# Patient Record
Sex: Female | Born: 1950 | Race: Black or African American | Hispanic: No | Marital: Married | Smoking: Former smoker
Health system: Southern US, Community
[De-identification: ages and names within clinical notes are randomized; demographics above are authoritative.]

## PROBLEM LIST (undated history)

## (undated) DIAGNOSIS — F419 Anxiety disorder, unspecified: Secondary | ICD-10-CM

## (undated) DIAGNOSIS — F39 Unspecified mood [affective] disorder: Secondary | ICD-10-CM

## (undated) DIAGNOSIS — G4730 Sleep apnea, unspecified: Secondary | ICD-10-CM

## (undated) DIAGNOSIS — R569 Unspecified convulsions: Secondary | ICD-10-CM

## (undated) DIAGNOSIS — E119 Type 2 diabetes mellitus without complications: Secondary | ICD-10-CM

## (undated) DIAGNOSIS — F0390 Unspecified dementia without behavioral disturbance: Secondary | ICD-10-CM

## (undated) DIAGNOSIS — I00 Rheumatic fever without heart involvement: Secondary | ICD-10-CM

## (undated) DIAGNOSIS — I639 Cerebral infarction, unspecified: Secondary | ICD-10-CM

## (undated) DIAGNOSIS — E785 Hyperlipidemia, unspecified: Secondary | ICD-10-CM

## (undated) DIAGNOSIS — I10 Essential (primary) hypertension: Secondary | ICD-10-CM

## (undated) HISTORY — DX: Rheumatic fever without heart involvement: I00

## (undated) HISTORY — DX: Unspecified mood (affective) disorder: F39

## (undated) HISTORY — DX: Anxiety disorder, unspecified: F41.9

## (undated) HISTORY — DX: Sleep apnea, unspecified: G47.30

---

## 2016-08-09 ENCOUNTER — Ambulatory Visit: Payer: Medicare HMO | Attending: Family Medicine | Admitting: Occupational Therapy

## 2016-08-09 DIAGNOSIS — R278 Other lack of coordination: Secondary | ICD-10-CM | POA: Diagnosis present

## 2016-08-09 DIAGNOSIS — I69315 Cognitive social or emotional deficit following cerebral infarction: Secondary | ICD-10-CM

## 2016-08-09 DIAGNOSIS — R41842 Visuospatial deficit: Secondary | ICD-10-CM | POA: Insufficient documentation

## 2016-08-11 NOTE — Therapy (Signed)
[facility] [address] [place], [place], [address] Phone: [phone]   Fax:  [phone]  Occupational Therapy Treatment  Patient Details  Name: Angela Fields MRN: [id number] Date of Birth: 1951/08/17 Referring Provider: Dr. Caffie DammeKarla Smith  Encounter Date: 08/09/2016      OT End of Session - 08/11/16 1558    Visit Number 1   Number of Visits 5   Date for OT Re-Evaluation 09/08/16   Authorization Type Medicare   Authorization - Visit Number 1   Authorization - Number of Visits 10   OT Start Time 1400   OT Stop Time 1445   OT Time Calculation (min) 45 min   Activity Tolerance Patient tolerated treatment well   Behavior During Therapy Impulsive      No past medical history on file.  No past surgical history on file.  There were no vitals filed for this visit.      Subjective Assessment - 08/11/16 1547    Subjective  Pt s/p CVA in 2010 presents with residual coordination/ visual deficits   Pertinent History see Epic   Patient Stated Goals improve functional use of Right hand   Currently in Pain? No/denies            [facility] - 08/11/16 0001      Assessment   Diagnosis CVA   Referring Provider Dr. Caffie DammeKarla Smith   Onset Date --  2010     Precautions   Precautions Other (comment)  aphasia   Precaution Comments aphasia, right visual field deficit     Balance Screen   Has the patient fallen in the past 6 months Yes   How many times? 1   Has the patient had a decrease in activity level because of a fear of falling?  No   Is the patient reluctant to leave their home because of a fear of falling?  No     Home  Environment   Family/patient expects to be discharged to: Private residence   [employer] Shower/Tub Tub/Shower unit   Lives With Spouse     Prior Function   Level of Independence Independent     ADL   ADL comments modified independent with all basic ADLs     IADL   Shopping  Needs to be accompanied on any shopping trip   Light Housekeeping --  Performs home management with some assistance   Meal Prep --  distant supervision, difficulty organizing   Medication Management --  Needs assist with meds     Mobility   Mobility Status Independent     Written Expression   Dominant Hand Right   Handwriting 100% legible     Vision - History   Baseline Vision Wears glasses only for reading   Patient Visual Report Peripheral vision impairment     Vision Assessment   Vision Assessment Vision impaired  _ to be further tested in functional context   Visual Fields Right visual field deficit per gross assessment   Comment Pt's husband reports pt has a right peripheral visual field deficit     Cognition   Overall Cognitive Status Cognition to be further assessed in functional context PRN  aphasia     Sensation   Light Touch Appears Intact     Coordination   9 Hole Peg Test Right;Left   Right 9 Hole Peg Test 43.09 secs   Left 9 Hole Peg Test 29.34 secs   Tremors tremors at times in  both hands, right greater than left and head     ROM / Strength   AROM / PROM / Strength AROM;Strength     AROM   Overall AROM  Within functional limits for tasks performed     Strength   Overall Strength Comments RUE 4-4+/5, LUE 4-4+/5                               OT Long Term Goals - 08/11/16 1607      OT LONG TERM GOAL #1   Title I with HEP.   Time 4   Period Weeks   Status New     OT LONG TERM GOAL #2   Title Pt/ husband will verbalize understanding of compensatory strategies for visual perceptual deficits   Time 4   Period Weeks   Status New     OT LONG TERM GOAL #3   Title Pt./ husband will verbalize understanding of adapted strategies to increase pt safety and independence with home mnagement tasks(ie:laundry)   Time 4   Period Weeks   Status New               Plan - 08/11/16 1554    Clinical Impression Statement Pt s/p  CVA in 2010 presents with residual coordination, cognitive and visuospatial deficits. Pt can benefit from skilled occupational therapy to maximize safety and independence with ADLs/ IADLs.   Rehab Potential Good   Clinical Impairments Affecting Rehab Potential aphasia   OT Frequency 1x / week   OT Duration 4 weeks  plus eval   OT Treatment/Interventions Self-care/ADL training;Therapeutic exercise;Cognitive remediation/compensation;Visual/perceptual remediation/compensation;Patient/family education;Therapeutic exercises;DME and/or AE instruction;Manual Therapy;Passive range of motion;Therapeutic activities;Neuromuscular education;Moist Heat   Plan coordination HEP, education regarding vision strategies, laundry task   Consulted and Agree with Plan of Care Patient;Family member/caregiver   Family Member Consulted husband      Patient will benefit from skilled therapeutic intervention in order to improve the following deficits and impairments:  Decreased cognition, Impaired vision/preception, Decreased coordination, Decreased strength, Impaired UE functional use, Impaired perceived functional ability, Decreased safety awareness, Decreased knowledge of precautions  Visit Diagnosis: Other lack of coordination - Plan: Ot plan of care cert/re-cert  Visuospatial deficit - Plan: Ot plan of care cert/re-cert  Cognitive social or emotional deficit following cerebral infarction - Plan: Ot plan of care cert/re-cert    Problem List There are no active problems to display for this patient.   RINE,KATHRYN 08/11/2016, 4:15 PM Keene BreathKathryn Rine, OTR/L [phone] Phone: [phone] 4:15 PM 08/11/16 [facility] [address] [place], [place], [address] Phone: [phone]   Fax:  [phone]  Name: Angela Fields MRN: [id number] Date of Birth: Jun 04, 1951

## 2016-08-15 ENCOUNTER — Telehealth: Payer: Self-pay | Admitting: Occupational Therapy

## 2016-08-15 NOTE — Telephone Encounter (Signed)
Dr. Caffie DammeKarla Fields , Philomena DohenyCarolyn Zaro was seen for occupational therapy evaluation. Eber JonesCarolyn and her husband would like an order for ST to address pt's aphasia and cognition. If you agree please make this referral. Thanks, Keene BreathKathryn Rain [place], OTR/L

## 2016-08-20 ENCOUNTER — Ambulatory Visit: Payer: Medicare HMO | Admitting: Occupational Therapy

## 2016-08-20 ENCOUNTER — Ambulatory Visit: Payer: Medicare HMO | Attending: Family Medicine | Admitting: Occupational Therapy

## 2016-08-20 DIAGNOSIS — R278 Other lack of coordination: Secondary | ICD-10-CM | POA: Diagnosis present

## 2016-08-20 DIAGNOSIS — I69315 Cognitive social or emotional deficit following cerebral infarction: Secondary | ICD-10-CM

## 2016-08-20 DIAGNOSIS — R41842 Visuospatial deficit: Secondary | ICD-10-CM

## 2016-08-20 NOTE — Therapy (Signed)
[facility] [address] [place], [place], [address] Phone: [phone]   Fax:  [phone]  Occupational Therapy Treatment  Patient Details  Name: Angela DohenyCarolyn Fields MRN: [id number] Date of Birth: 04-18-1951 Referring Provider: Dr. Caffie DammeKarla Smith  Encounter Date: 08/20/2016      OT End of Session - 08/20/16 1516    Visit Number 2   Number of Visits 5   Date for OT Re-Evaluation 09/08/16   Authorization Type Medicare   Authorization - Visit Number 2   Authorization - Number of Visits 10   OT Start Time 1452   OT Stop Time 1530   OT Time Calculation (min) 38 min   Activity Tolerance Patient tolerated treatment well   Behavior During Therapy [facility] for tasks assessed/performed      No past medical history on file.  No past surgical history on file.  There were no vitals filed for this visit.      Subjective Assessment - 08/20/16 1453    Subjective  denies pain   Pertinent History see Epic   Patient Stated Goals improve functional use of Right hand   Currently in Pain? No/denies           Copying small peg design with RUE for increased fine motor coordination and visual perceptual skills, mod / max difficulty for design.                    OT Education - 08/20/16 1613    Education provided Yes   Education Details coordination HEP, compensation strategies for visual perceptual deficits   Person(s) Educated Patient;Spouse   Methods Explanation;Demonstration;Verbal cues;Handout   Comprehension Verbalized understanding;Returned demonstration;Verbal cues required             OT Long Term Goals - 08/11/16 1607      OT LONG TERM GOAL #1   Title I with HEP.   Time 4   Period Weeks   Status New     OT LONG TERM GOAL #2   Title Pt/ husband will verbalize understanding of compensatory strategies for visual perceptual deficits   Time 4   Period Weeks   Status New     OT LONG TERM GOAL #3    Title Pt./ husband will verbalize understanding of adapted strategies to increase pt safety and independence with home mnagement tasks(ie:laundry)   Time 4   Period Weeks   Status New               Plan - 08/20/16 1611    Clinical Impression Statement Pt is progressing towards goals. Pt/ husband verbalize understanding of HEP.   Rehab Potential Good   Clinical Impairments Affecting Rehab Potential aphasia   OT Frequency 1x / week   OT Duration 4 weeks   OT Treatment/Interventions Self-care/ADL training;Therapeutic exercise;Cognitive remediation/compensation;Visual/perceptual remediation/compensation;Patient/family education;Therapeutic exercises;DME and/or AE instruction;Manual Therapy;Passive range of motion;Therapeutic activities;Neuromuscular education;Moist Heat   Plan laundry task   Consulted and Agree with Plan of Care Patient   Family Member Consulted husband      Patient will benefit from skilled therapeutic intervention in order to improve the following deficits and impairments:  Decreased cognition, Impaired vision/preception, Decreased coordination, Decreased strength, Impaired UE functional use, Impaired perceived functional ability, Decreased safety awareness, Decreased knowledge of precautions  Visit Diagnosis: Other lack of coordination  Visuospatial deficit  Cognitive social or emotional deficit following cerebral infarction    Problem List There are no active problems to display for this  patient.   RINE,KATHRYN 08/20/2016, 4:21 PM Keene BreathKathryn Rine, OTR/L [phone] Phone: [phone] 4:22 PM 11/07/17[facility] [address] [place], [place], [address] Phone: [phone]   Fax:  [phone]  Name: Angela DohenyCarolyn Fields MRN: [id number] Date of Birth: [birth year]

## 2016-08-20 NOTE — Patient Instructions (Addendum)
  Coordination Activities  Perform the following activities for 20 minutes 1 times per day with right hand(s).   Rotate ball in fingertips (clockwise and counter-clockwise).  Toss ball between hands.  Flip cards 1 at a time as fast as you can.  Deal cards with your thumb (Hold deck in hand and push card off top with thumb).  Pick up coins and place in container or coin bank.  Pick up coins and stack.   Hold 5 coins in your hand and count out 1 at a time                                  1. Look for the edge of objects (to the left and/or right) so that you make sure you are seeing all of an object 2. Turn your head when walking, scan from side to side, particularly in busy environments 3. Use an organized scanning pattern. It's usually easier to scan from top to bottom, and left to right (like you are reading) 4. Double check yourself 5. Use a line guide (like a blank piece of paper) or your finger when reading 6. If necessary, place brightly colored tape at end of table or work area as a reminder to always look until you see the tape.

## 2016-08-27 ENCOUNTER — Ambulatory Visit: Payer: Medicare HMO | Admitting: Occupational Therapy

## 2016-08-27 DIAGNOSIS — R278 Other lack of coordination: Secondary | ICD-10-CM | POA: Diagnosis not present

## 2016-08-27 DIAGNOSIS — R41842 Visuospatial deficit: Secondary | ICD-10-CM

## 2016-08-27 DIAGNOSIS — I69315 Cognitive social or emotional deficit following cerebral infarction: Secondary | ICD-10-CM

## 2016-08-28 NOTE — Therapy (Signed)
[facility] [address] [place], [place], [address] Phone: [phone]   Fax:  [phone]  Occupational Therapy Treatment  Patient Details  Name: Angela Fields MRN: [id number] Date of Birth: 05/25/1951 Referring Provider: Dr. Caffie DammeKarla Smith  Encounter Date: 08/27/2016      OT End of Session - 08/28/16 1041    Visit Number 3   Number of Visits 5   Date for OT Re-Evaluation 09/08/16   Authorization Type Medicare   Authorization - Visit Number 3   Authorization - Number of Visits 10   OT Start Time 1321   OT Stop Time 1400   OT Time Calculation (min) 39 min   Activity Tolerance Patient tolerated treatment well      No past medical history on file.  No past surgical history on file.  There were no vitals filed for this visit.                Treatment: laundry task for washing and drying lcohtes. Pt and therapist simulated task step by step with husband present, pt required mod v.c. To sequence task. Pt's husband to review with pt at home then pt will attempt task with supervision. Disussed rationale for completing a 24 piece puzzle, for cognition and visual perceptual skills. Pt worked on a 24 piece puzzle with mod-max v.c. For organization/ scanning, pt and husband to atttempt at home.                 OT Long Term Goals - 08/11/16 1607      OT LONG TERM GOAL #1   Title I with HEP.   Time 4   Period Weeks   Status New     OT LONG TERM GOAL #2   Title Pt/ husband will verbalize understanding of compensatory strategies for visual perceptual deficits   Time 4   Period Weeks   Status New     OT LONG TERM GOAL #3   Title Pt./ husband will verbalize understanding of adapted strategies to increase pt safety and independence with home mnagement tasks(ie:laundry)   Time 4   Period Weeks   Status New               Plan - 08/28/16 1038    Clinical Impression Statement Pt is  progressing towards goals. She demonstrates cognitive and visual perceptual deficits.   Rehab Potential Good   Clinical Impairments Affecting Rehab Potential aphasia   OT Frequency 1x / week   OT Duration 4 weeks   OT Treatment/Interventions Self-care/ADL training;Therapeutic exercise;Cognitive remediation/compensation;Visual/perceptual remediation/compensation;Patient/family education;Therapeutic exercises;DME and/or AE instruction;Manual Therapy;Passive range of motion;Therapeutic activities;Neuromuscular education;Moist Heat   Plan review progress HEP, discuss adpated strategies for home management- anticipate d/c next visit   Consulted and Agree with Plan of Care Patient   Family Member Consulted husband      Patient will benefit from skilled therapeutic intervention in order to improve the following deficits and impairments:  Decreased cognition, Impaired vision/preception, Decreased coordination, Decreased strength, Impaired UE functional use, Impaired perceived functional ability, Decreased safety awareness, Decreased knowledge of precautions  Visit Diagnosis: Other lack of coordination  Visuospatial deficit  Cognitive social or emotional deficit following cerebral infarction    Problem List There are no active problems to display for this patient.   RINE,KATHRYN 08/28/2016, 10:41 AM Keene BreathKathryn Rine, OTR/L [phone] Phone: [phone] 10:44 AM 08/28/16 [facility] [address] [place], [place], [address] Phone: [phone]  Fax:  [phone]  Name: Angela Fields MRN: [id number] Date of Birth: 1951-09-04

## 2016-09-19 ENCOUNTER — Ambulatory Visit: Payer: Medicare HMO | Admitting: Occupational Therapy

## 2016-09-24 ENCOUNTER — Emergency Department (HOSPITAL_COMMUNITY)
Admission: EM | Admit: 2016-09-24 | Discharge: 2016-09-24 | Disposition: A | Discharge status: Home / Self Care | Payer: Medicare HMO | Attending: Emergency Medicine | Admitting: Emergency Medicine

## 2016-09-24 ENCOUNTER — Encounter (HOSPITAL_COMMUNITY): Payer: Self-pay

## 2016-09-24 ENCOUNTER — Emergency Department (HOSPITAL_COMMUNITY): Payer: Medicare HMO

## 2016-09-24 DIAGNOSIS — R4701 Aphasia: Secondary | ICD-10-CM | POA: Diagnosis not present

## 2016-09-24 DIAGNOSIS — E119 Type 2 diabetes mellitus without complications: Secondary | ICD-10-CM | POA: Diagnosis not present

## 2016-09-24 DIAGNOSIS — I69398 Other sequelae of cerebral infarction: Secondary | ICD-10-CM | POA: Diagnosis not present

## 2016-09-24 DIAGNOSIS — Z5181 Encounter for therapeutic drug level monitoring: Secondary | ICD-10-CM | POA: Diagnosis not present

## 2016-09-24 DIAGNOSIS — Z8673 Personal history of transient ischemic attack (TIA), and cerebral infarction without residual deficits: Secondary | ICD-10-CM | POA: Insufficient documentation

## 2016-09-24 DIAGNOSIS — H53469 Homonymous bilateral field defects, unspecified side: Secondary | ICD-10-CM | POA: Diagnosis not present

## 2016-09-24 DIAGNOSIS — I10 Essential (primary) hypertension: Secondary | ICD-10-CM | POA: Insufficient documentation

## 2016-09-24 DIAGNOSIS — E785 Hyperlipidemia, unspecified: Secondary | ICD-10-CM | POA: Diagnosis present

## 2016-09-24 DIAGNOSIS — R4182 Altered mental status, unspecified: Secondary | ICD-10-CM | POA: Diagnosis present

## 2016-09-24 HISTORY — DX: Hyperlipidemia, unspecified: E78.5

## 2016-09-24 HISTORY — DX: Cerebral infarction, unspecified: I63.9

## 2016-09-24 HISTORY — DX: Type 2 diabetes mellitus without complications: E11.9

## 2016-09-24 HISTORY — DX: Essential (primary) hypertension: I10

## 2016-09-24 LAB — COMPREHENSIVE METABOLIC PANEL
ALT: 15 U/L (ref 14–54)
AST: 19 U/L (ref 15–41)
Albumin: 3.9 g/dL (ref 3.5–5.0)
Alkaline Phosphatase: 55 U/L (ref 38–126)
Anion gap: 10 (ref 5–15)
BUN: 11 mg/dL (ref 6–20)
CO2: 29 mmol/L (ref 22–32)
Calcium: 9.2 mg/dL (ref 8.9–10.3)
Chloride: 100 mmol/L — ABNORMAL LOW (ref 101–111)
Creatinine, Ser: 1.31 mg/dL — ABNORMAL HIGH (ref 0.44–1.00)
GFR calc Af Amer: 48 mL/min — ABNORMAL LOW (ref >60)
GFR calc non Af Amer: 41 mL/min — ABNORMAL LOW (ref >60)
Glucose, Bld: 114 mg/dL — ABNORMAL HIGH (ref 65–99)
Potassium: 3.1 mmol/L — ABNORMAL LOW (ref 3.5–5.1)
Sodium: 139 mmol/L (ref 135–145)
Total Bilirubin: 0.5 mg/dL (ref 0.3–1.2)
Total Protein: 6.8 g/dL (ref 6.5–8.1)

## 2016-09-24 LAB — DIFFERENTIAL
Basophils Absolute: 0 10*3/uL (ref 0.0–0.1)
Basophils Relative: 0 %
Eosinophils Absolute: 0.1 10*3/uL (ref 0.0–0.7)
Eosinophils Relative: 1 %
Lymphocytes Relative: 29 %
Lymphs Abs: 1.6 10*3/uL (ref 0.7–4.0)
Monocytes Absolute: 0.4 10*3/uL (ref 0.1–1.0)
Monocytes Relative: 6 %
Neutro Abs: 3.5 10*3/uL (ref 1.7–7.7)
Neutrophils Relative %: 64 %

## 2016-09-24 LAB — URINALYSIS, ROUTINE W REFLEX MICROSCOPIC
Bacteria, UA: NONE SEEN
Bilirubin Urine: NEGATIVE
Glucose, UA: NEGATIVE mg/dL
Ketones, ur: NEGATIVE mg/dL
Nitrite: NEGATIVE
Protein, ur: NEGATIVE mg/dL
Specific Gravity, Urine: 1.001 — ABNORMAL LOW (ref 1.005–1.030)
pH: 7 (ref 5.0–8.0)

## 2016-09-24 LAB — I-STAT CHEM 8, ED
BUN: 14 mg/dL (ref 6–20)
Calcium, Ion: 1.12 mmol/L — ABNORMAL LOW (ref 1.15–1.40)
Chloride: 98 mmol/L — ABNORMAL LOW (ref 101–111)
Creatinine, Ser: 1.3 mg/dL — ABNORMAL HIGH (ref 0.44–1.00)
Glucose, Bld: 112 mg/dL — ABNORMAL HIGH (ref 65–99)
HCT: 38 % (ref 36.0–46.0)
Hemoglobin: 12.9 g/dL (ref 12.0–15.0)
Potassium: 3 mmol/L — ABNORMAL LOW (ref 3.5–5.1)
Sodium: 140 mmol/L (ref 135–145)
TCO2: 30 mmol/L (ref 0–100)

## 2016-09-24 LAB — PROTIME-INR
INR: 1.01
Prothrombin Time: 13.3 seconds (ref 11.4–15.2)

## 2016-09-24 LAB — I-STAT TROPONIN, ED: Troponin i, poc: 0 ng/mL (ref 0.00–0.08)

## 2016-09-24 LAB — CBC
HCT: 38.1 % (ref 36.0–46.0)
Hemoglobin: 12.8 g/dL (ref 12.0–15.0)
MCH: 30.1 pg (ref 26.0–34.0)
MCHC: 33.6 g/dL (ref 30.0–36.0)
MCV: 89.6 fL (ref 78.0–100.0)
Platelets: 214 10*3/uL (ref 150–400)
RBC: 4.25 MIL/uL (ref 3.87–5.11)
RDW: 13.2 % (ref 11.5–15.5)
WBC: 5.5 10*3/uL (ref 4.0–10.5)

## 2016-09-24 LAB — RAPID URINE DRUG SCREEN, HOSP PERFORMED
Amphetamines: NOT DETECTED
Barbiturates: NOT DETECTED
Benzodiazepines: NOT DETECTED
Cocaine: NOT DETECTED
Opiates: NOT DETECTED
Tetrahydrocannabinol: NOT DETECTED

## 2016-09-24 LAB — APTT: aPTT: 27 seconds (ref 24–36)

## 2016-09-24 LAB — ETHANOL: Alcohol, Ethyl (B): <5 mg/dL (ref <5)

## 2016-09-24 MED ORDER — LAMOTRIGINE 150 MG PO TABS
300.0000 mg | ORAL_TABLET | Freq: Once | ORAL | Status: AC
  Administered 2016-09-24: 300 mg via ORAL
  Filled 2016-09-24: qty 2

## 2016-09-24 NOTE — Consult Note (Signed)
Requesting Physician: EDP    Chief Complaint: Increased confusion  History obtained from:   Patient and Chart     HPI:                                                                                                                                         Angela DohenyCarolyn Fields is an 65 y.o. 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 This morning, patient was in the bathroom at 0300 when she was calling for her husband repeatedly. Patient's husband states he checked on her 3 or 4 times instructing her to get off the toilet and go back to bed. She said she was still urinating. This went on for over an hour and a half. Finally, patient's husband called EMS as he felt she was more confused than her baseline. He notes that she took an extra klonopin in the middle of the night that was in addition to her normal scheduled doses. At her baseline, patient has receptive and expressive aphasia with right visual field deficits. Patient's husband reports that her aphasia comes and goes but seems to be getting worse over time with understanding. He states that three years ago they recommended nursing facility placement for her, but he refused. He feels he needs more help at this time. She had no seizure like activity, complaints of weakness, numbness, falls, vision changes or hearing changes.   Patient does not feel that anything is wrong with her.   In the ED, patient was afebrile, hypertension at 173/82, HR 64, RR 17, satting 100% on room air. BMET revealed hypokalemia at 3.1, Creatinine of 1.31, glucose 114. CBC WNL. Troponin negative. EKG sinus rhythm. Alcohol negative, UDS negative, UA without signs of infection. CT Head showed no acute ischemic infarct. Negative for intracranial Hemorrhage. Chronic  infarct left occipital parietal lobe. Chronic microvascular ischemia in the white matter. 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  Date last known well: Date 09/24/16 Time last known well: Time: 01:30 tPA Given: No: Out of window No Symptoms         0 No significant disability/able to carry out all usual activities   1 Unable to carry out all previous activities but looks after own affairs             2 Requires help but walks without assistance     3 Unable to walk without assistance/unable to handle own bodily needs 4 Bedridden/incontinent        5 Dead          6  Modified Rankin: Rankin Score=3   Past Medical History:  Diagnosis Date  . Diabetes mellitus without complication (HCC)   . Hyperlipidemia   .  Hypertension   . Stroke [facility](HCC)    Family Hx: Father- Heart Disease Mother: Cancer  Social History:  reports that she has never smoked. She has never used smokeless tobacco. She reports that she does not use drugs. Her alcohol history is not on file.  Allergies: No Known Allergies  Medications:                                                                                                                           I have reviewed the patient's current medications. Metoprolol, lamictal, klonopin 0.5 mg 1/2 tab TID, Vimpat, Kdur, Valsartan-HCTZ,   ROS:                                                                                                                                       A complete ROS was negative except as per HPI.   Neurologic Examination:                                                                                                      Vitals:   09/24/16 0915 09/24/16 0930 09/24/16 0945 09/24/16 1000  BP: 151/61 180/67 153/75 (!) 163/139  Pulse: 63 (!) 59 (!) 54 (!) 59  Resp: 13 (!) 9 (!) 7 19  Temp:      TempSrc:      SpO2: 97% 98% 98% 96%  Weight:      Height:       General: Vital signs reviewed.  Patient is well-developed and  well-nourished, in no acute distress and cooperative with exam.  Head: Normocephalic and atraumatic. Eyes: EOMI, conjunctivae normal, no scleral icterus.  Neck: Supple, trachea midline Cardiovascular: RRR Pulmonary/Chest: Clear to auscultation bilaterally, no wheezes, rales, or rhonchi. Abdominal: Soft, non-tender, non-distended, BS + Extremities: No lower extremity edema bilaterally, pulses symmetric and intact bilaterally.  Skin: Warm, dry and intact. No rashes or erythema.  Neurological Examination Mental Status: Alert, oriented to self and place but not  time.  Speech is broken with evidence of receptive and expressive aphasia also with confabulation. Unable to follow 3 step commands without difficulty.  Cranial Nerves: II: Right Homonymous Hemianopsia III,IV, VI: ptosis not present, extra-ocular motions intact bilaterally, pupils equal, round, reactive to light and accommodation V,VII: smile symmetric, facial light touch sensation normal bilaterally VIII: hearing normal bilaterally IX,X: uvula rises symmetrically XI: bilateral shoulder shrug XII: midline tongue extension Motor: Right : Upper extremity   5/5    Left:     Upper extremity   5/5  Lower extremity   5/5     Lower extremity   5/5 Tone and bulk:normal tone throughout; no atrophy noted Sensory: Decreased sensation in right upper extremity Plantars: Right: downgoing   Left: downgoing Cerebellar: Mild tremor on finger-to-nose. Unable to perform normal rapid alternating movements and normal heel-to-shin test    Lab Results: Basic Metabolic Panel:  Recent Labs Lab 09/24/16 0649 09/24/16 0709  NA 139 140  K 3.1* 3.0*  CL 100* 98*  CO2 29  --   GLUCOSE 114* 112*  BUN 11 14  CREATININE 1.31* 1.30*  CALCIUM 9.2  --     Liver Function Tests:  Recent Labs Lab 09/24/16 0649  AST 19  ALT 15  ALKPHOS 55  BILITOT 0.5  PROT 6.8  ALBUMIN 3.9   CBC:  Recent Labs Lab 09/24/16 0649 09/24/16 0709  WBC 5.5   --   NEUTROABS 3.5  --   HGB 12.8 12.9  HCT 38.1 38.0  MCV 89.6  --   PLT 214  --    Coagulation Studies:  Recent Labs  09/24/16 0649  LABPROT 13.3  INR 1.01    Imaging: Ct Head Code Stroke W/o Cm  Result Date: 09/24/2016 CLINICAL DATA:  Code stroke.  Stroke symptoms EXAM: CT HEAD WITHOUT CONTRAST TECHNIQUE: Contiguous axial images were obtained from the base of the skull through the vertex without intravenous contrast. COMPARISON:  None. FINDINGS: Brain: Chronic infarct left occipital parietal lobe. Chronic microvascular ischemic change throughout the cerebral white matter bilaterally. Mild atrophy. Negative for acute infarct. Negative for hemorrhage or mass. No shift of the midline structures. Vascular: No hyperdense vessel. 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 Skull: Negative Sinuses/Orbits: Negative Other: None ASPECTS (Alberta Stroke Program Early CT Score) - Ganglionic level infarction (caudate, lentiform nuclei, internal capsule, insula, M1-M3 cortex): 7 - Supraganglionic infarction (M4-M6 cortex): 3 Total score (0-10 with 10 being normal): 10 IMPRESSION: 1. Negative for acute ischemic infarct. Negative for intracranial hemorrhage. 2. ASPECTS is 10 3. Chronic infarct left occipital parietal lobe. Chronic microvascular ischemia in the white matter. 4. 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 These results were called by telephone at the time of interpretation on 09/24/2016 at 7:26 am to Dr. Amada Jupiter, who verbally acknowledged these results. Electronically Signed   By: Marlan Palau M.D.   On: 09/24/2016 07:27   Stroke Risk Factors - diabetes mellitus, hyperlipidemia and hypertension  Assessment: 65 y.o. 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  Acute Encephalopathy 2/2 Medication Effect: Patient has baseline receptive and expressive aphasia with right sided homonymous hemianopsia from her previous CVA in 2010. Per husband, her confusion was worse this morning after taking an extra dose of Klonopin. He reported no new deficits  or seizure like activity. CT Head without acute intracranial abnormality. Her acute worsening and transient confusion was likely secondary to Klonopin as this can worsen baseline deficits and makes the possibility of recurrent seizures less likely. Patient's husband feels he needs more help at home and has become frustrated with his situation. Case management may be helpful in this circumstance. No further imaging is necessary at this time. Patient can follow up as outpatient.   Assessment and plan discussed with with attending physician and they are in agreement.    Karlene Lineman, DO PGY-3 Internal Medicine Resident Pager # [phone] 09/24/2016 10:08 AM

## 2016-09-24 NOTE — ED Provider Notes (Signed)
[facility]-EMERGENCY DEPT Provider Note   CSN: [id number] Arrival date & time: 09/24/16  [id number]    History   Chief Complaint Chief Complaint  Patient presents with  . Altered Mental Status    HPI Angela Fields is a 65 y.o. female.  HPI   65 year old female presents today with altered mental status. Patient has a history of CVA in 2010. EMS notes that patient went to bed normal last night, husband noted she was altered at 3:30 this morning.  Per chart review patient has aphasia, patient reports she does have aphasia. She is unable to provide any other significant details, she is unable to follow commands, she appears to be no acute distress, has very clear speech, does not appear ill.  Spoke with her husband on the phone reports that patient was on the toilet last night. She was acting appropriately but was very demanding. He reports that she kept calling out for him every 15 minutes wanting something different. She would not get off the toilet, but was not still pain. Patient reports that she took one of her anti-anxiety medicine shortly before becoming more confused. Husband notes that this behavior is not atypical of her, states that he was frustrated and called EMS. He denies any focal neurological deficits beyond her baseline aphasia.     Past Medical History:  Diagnosis Date  . Diabetes mellitus without complication (HCC)   . Hyperlipidemia   . Hypertension   . Stroke [facility](HCC)     Patient Active Problem List   Diagnosis Date Noted  . Hypertension 09/24/2016  . History of CVA (cerebrovascular accident) 09/24/2016  . 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 Hyperlipidemia 09/24/2016    No past surgical history on file.  OB History    No data available       Home Medications    Prior to Admission medications   Not on File    Family History No family history on file.  Social History Social History  Substance Use Topics  . Smoking status:  Never Smoker  . Smokeless tobacco: Never Used  . Alcohol use Not on file     Allergies   Patient has no known allergies.   Review of Systems Review of Systems  All other systems reviewed and are negative.  Physical Exam Updated Vital Signs BP 133/64   Pulse 61   Temp 97.8 F (36.6 C)   Resp 12   Ht 5\' 5"  (1.651 m)   Wt 88.5 kg   SpO2 96%   BMI 32.45 kg/m   Physical Exam  Constitutional: She appears well-developed and well-nourished.  HENT:  Head: Normocephalic and atraumatic.  Eyes: Conjunctivae are normal. Pupils are equal, round, and reactive to light. Right eye exhibits no discharge. Left eye exhibits no discharge. No scleral icterus. Right pupil is round and reactive. Left pupil is round and reactive. Pupils are equal.  Neck: Normal range of motion. No JVD present. No tracheal deviation present.  Pulmonary/Chest: Effort normal and breath sounds normal. No stridor. No respiratory distress. She has no wheezes. She has no rales. She exhibits no tenderness.  Neurological: She is alert. No sensory deficit. Coordination normal.  Difficult exam as patient does not follow commands- no facial asymmetry, upper and lower extremity strength 5 out of 5, sensation grossly intact, no pronator drift, unable to assess visual fields or horizontal extraocular movements- receptive aphasia- no dysarthria  Skin: Skin is warm.  Psychiatric: She has a normal mood and  affect. Her behavior is normal. Judgment and thought content normal.  Nursing note and vitals reviewed.    ED Treatments / Results  Labs (all labs ordered are listed, but only abnormal results are displayed) Labs Reviewed  COMPREHENSIVE METABOLIC PANEL - Abnormal; Notable for the following:       Result Value   Potassium 3.1 (*)    Chloride 100 (*)    Glucose, Bld 114 (*)    Creatinine, Ser 1.31 (*)    GFR calc non Af Amer 41 (*)    GFR calc Af Amer 48 (*)    All other components within normal limits  URINALYSIS,  ROUTINE W REFLEX MICROSCOPIC - Abnormal; Notable for the following:    Color, Urine COLORLESS (*)    Specific Gravity, Urine 1.001 (*)    Hgb urine dipstick SMALL (*)    Leukocytes, UA TRACE (*)    Squamous Epithelial / LPF 0-5 (*)    All other components within normal limits  I-STAT CHEM 8, ED - Abnormal; Notable for the following:    Potassium 3.0 (*)    Chloride 98 (*)    Creatinine, Ser 1.30 (*)    Glucose, Bld 112 (*)    Calcium, Ion 1.12 (*)    All other components within normal limits  ETHANOL  PROTIME-INR  APTT  CBC  DIFFERENTIAL  RAPID URINE DRUG SCREEN, HOSP PERFORMED  I-STAT TROPOININ, ED    EKG  EKG Interpretation None       Radiology Ct Head Code Stroke W/o Cm  Result Date: 09/24/2016 CLINICAL DATA:  Code stroke.  Stroke symptoms EXAM: CT HEAD WITHOUT CONTRAST TECHNIQUE: Contiguous axial images were obtained from the base of the skull through the vertex without intravenous contrast. COMPARISON:  None. FINDINGS: Brain: Chronic infarct left occipital parietal lobe. Chronic microvascular ischemic change throughout the cerebral white matter bilaterally. Mild atrophy. Negative for acute infarct. Negative for hemorrhage or mass. No shift of the midline structures. Vascular: No hyperdense vessel. 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 Skull: Negative Sinuses/Orbits: Negative Other: None ASPECTS ([place]berta Stroke Program Early CT Score) - Ganglionic level infarction (caudate, lentiform nuclei, internal capsule, insula, M1-M3 cortex): 7 - Supraganglionic infarction (M4-M6 cortex): 3 Total score (0-10 with 10 being normal): 10 IMPRESSION: 1. Negative for acute ischemic infarct. Negative for intracranial hemorrhage. 2. ASPECTS is 10 3. Chronic infarct left occipital parietal lobe. Chronic microvascular ischemia in the white matter. 4. 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 These results were called by telephone at the time of interpretation on 09/24/2016  at 7:26 am to Dr. Amada Jupiter, who verbally acknowledged these results. Electronically Signed   By: Marlan Palau M.D.   On: 09/24/2016 07:27    Procedures Procedures (including critical care time)  Medications Ordered in ED Medications - No data to display   Initial Impression / Assessment and Plan / ED Course  I have reviewed the triage vital signs and the nursing notes.  Pertinent labs & imaging results that were available during my care of the patient were reviewed by me and considered in my medical decision making (see chart for details).  Clinical Course      Final Clinical Impressions(s) / ED Diagnoses   Final diagnoses:  Aphasia    Labs: Urinalysis, ethanol, i-STAT Chem-8,  PT INR, CMP  Imaging: CT head without  Consults: Neurology  Therapeutics:  Discharge Meds:   Assessment/Plan:   65 year old female presents today at the request of her husband  for altered mental status. After speaking with the husband on the phone it appears that she is not truly altered, and seems to be more difficult to handle at home. She had a CT head here with laboratory analysis. No significant findings were noted. Neurology was consult at 2 personally evaluated the patient. We agree that this is highly unlikely to be intracranial abnormality, stroke, seizure-like activity. I spoke with the patient's husband, he will be picking her up to take her home. We discussed following up with both primary care for placement if symptoms continue to progress and he is unable to provide adequate care for her at home, also discussed following up with neurology in the next several days for reevaluation. He is given strict return precautions, verbalized understanding and agreement to today's plan had no further questions or concerns.  New Prescriptions New Prescriptions   No medications on file     Eyvonne MechanicJeffrey Kyasia Steuck, PA-C 09/24/16 1057    Shon Batonourtney F Horton, MD 09/24/16 2321

## 2016-09-24 NOTE — ED Notes (Signed)
Spoke with spouse Baldo AshCarl.  States I will be there in the next hour to get her.

## 2016-09-24 NOTE — Discharge Instructions (Signed)
Please read attached information. If you experience any new or worsening signs or symptoms please return to the emergency room for evaluation. Please follow-up with your primary care provider or specialist as discussed.  °

## 2016-09-24 NOTE — ED Triage Notes (Signed)
PT got up to use bathroom at 0330 today when husband found her to be altered. Per husband pt was normal before going to bed. On arrival to ed pt presents with aphasia. Pt states to me "i have aphasia". Hx of cva 2010. Pt having difficulty following directions. PA at bedside assessing pt.

## 2017-06-14 ENCOUNTER — Emergency Department (HOSPITAL_COMMUNITY): Payer: Medicare HMO

## 2017-06-14 ENCOUNTER — Encounter (HOSPITAL_COMMUNITY): Payer: Self-pay

## 2017-06-14 ENCOUNTER — Emergency Department (HOSPITAL_COMMUNITY)
Admission: EM | Admit: 2017-06-14 | Discharge: 2017-06-14 | Disposition: A | Discharge status: Home / Self Care | Payer: Medicare HMO | Attending: Emergency Medicine | Admitting: Emergency Medicine

## 2017-06-14 DIAGNOSIS — R404 Transient alteration of awareness: Secondary | ICD-10-CM | POA: Diagnosis not present

## 2017-06-14 DIAGNOSIS — Z79899 Other long term (current) drug therapy: Secondary | ICD-10-CM | POA: Insufficient documentation

## 2017-06-14 DIAGNOSIS — I10 Essential (primary) hypertension: Secondary | ICD-10-CM | POA: Insufficient documentation

## 2017-06-14 DIAGNOSIS — R4182 Altered mental status, unspecified: Secondary | ICD-10-CM | POA: Diagnosis present

## 2017-06-14 LAB — COMPREHENSIVE METABOLIC PANEL
ALBUMIN: 4.1 g/dL (ref 3.5–5.0)
ALT: 15 U/L (ref 14–54)
ANION GAP: 8 (ref 5–15)
AST: 16 U/L (ref 15–41)
Alkaline Phosphatase: 61 U/L (ref 38–126)
BUN: 14 mg/dL (ref 6–20)
CO2: 32 mmol/L (ref 22–32)
Calcium: 9 mg/dL (ref 8.9–10.3)
Chloride: 102 mmol/L (ref 101–111)
Creatinine, Ser: 1.08 mg/dL — ABNORMAL HIGH (ref 0.44–1.00)
GFR calc Af Amer: 60 mL/min — ABNORMAL LOW (ref >60)
GFR calc non Af Amer: 52 mL/min — ABNORMAL LOW (ref >60)
GLUCOSE: 81 mg/dL (ref 65–99)
POTASSIUM: 3.1 mmol/L — AB (ref 3.5–5.1)
SODIUM: 142 mmol/L (ref 135–145)
Total Bilirubin: 0.4 mg/dL (ref 0.3–1.2)
Total Protein: 7.1 g/dL (ref 6.5–8.1)

## 2017-06-14 LAB — URINALYSIS, ROUTINE W REFLEX MICROSCOPIC
BACTERIA UA: NONE SEEN
BILIRUBIN URINE: NEGATIVE
Glucose, UA: NEGATIVE mg/dL
Ketones, ur: NEGATIVE mg/dL
Leukocytes, UA: NEGATIVE
Nitrite: NEGATIVE
PH: 5 (ref 5.0–8.0)
Protein, ur: NEGATIVE mg/dL
SPECIFIC GRAVITY, URINE: 1.009 (ref 1.005–1.030)
WBC, UA: NONE SEEN WBC/hpf (ref 0–5)

## 2017-06-14 LAB — CBC WITH DIFFERENTIAL/PLATELET
BASOS ABS: 0 10*3/uL (ref 0.0–0.1)
Basophils Relative: 0 %
Eosinophils Absolute: 0.1 10*3/uL (ref 0.0–0.7)
Eosinophils Relative: 2 %
HEMATOCRIT: 36.4 % (ref 36.0–46.0)
HEMOGLOBIN: 12 g/dL (ref 12.0–15.0)
LYMPHS PCT: 37 %
Lymphs Abs: 2.1 10*3/uL (ref 0.7–4.0)
MCH: 29.9 pg (ref 26.0–34.0)
MCHC: 33 g/dL (ref 30.0–36.0)
MCV: 90.5 fL (ref 78.0–100.0)
MONO ABS: 0.4 10*3/uL (ref 0.1–1.0)
Monocytes Relative: 7 %
NEUTROS ABS: 3.1 10*3/uL (ref 1.7–7.7)
Neutrophils Relative %: 54 %
Platelets: 194 10*3/uL (ref 150–400)
RBC: 4.02 MIL/uL (ref 3.87–5.11)
RDW: 12.9 % (ref 11.5–15.5)
WBC: 5.7 10*3/uL (ref 4.0–10.5)

## 2017-06-14 LAB — TROPONIN I

## 2017-06-14 LAB — RAPID URINE DRUG SCREEN, HOSP PERFORMED
AMPHETAMINES: NOT DETECTED
Barbiturates: NOT DETECTED
Benzodiazepines: NOT DETECTED
COCAINE: NOT DETECTED
OPIATES: NOT DETECTED
TETRAHYDROCANNABINOL: NOT DETECTED

## 2017-06-14 LAB — CBG MONITORING, ED
GLUCOSE-CAPILLARY: 85 mg/dL (ref 65–99)
Glucose-Capillary: 71 mg/dL (ref 65–99)

## 2017-06-14 LAB — ETHANOL

## 2017-06-14 NOTE — ED Notes (Signed)
Bed: [id number] Expected date: 06/14/17 Expected time: 5:44 PM Means of arrival: Ambulance Comments: Hypoglycemia   CBG 64

## 2017-06-14 NOTE — ED Notes (Signed)
Pt's family stated the pt reported her tongue feeling weird for a couple days, and he would like her tongue assessed.

## 2017-06-14 NOTE — ED Triage Notes (Signed)
Patient present with c/o hypoglycemia per ems blood sugar 64. pt was given D10 4 grams per ems. recheck glucose was 120. Pt have slurred speech from previous stoke. Pt seem little confused at this time.

## 2017-06-14 NOTE — ED Provider Notes (Signed)
[facility]-EMERGENCY DEPT Provider Note   CSN: [id number] Arrival date & time: 06/14/17  1748  LEVEL 5 CAVEAT - ALTERED MENTAL STATUS  History   Chief Complaint Chief Complaint  Patient presents with  . Altered Mental Status    HPI Angela Fields is a 66 y.o. female.  HPI  66 year old female presents with acute altered mental status. History taken from the patient as well as the husband. EMS was no longer around by the time I evaluated the patient. According to the husband, the patient has been slightly off this morning. She seemed a little confused when taking her pills. She has a history of a prior stroke that has left her with aphasia. She is currently on medicine for seizures and has not had seizure in quite some time. Husband went to the store with her and left her in the car with the car on an air conditioning on. When he came back and saw her she looked like she was sleepy like she might pass out. She was telling her husband that she needed to go to the hospital. She couldn't answer exactly why. Has been started trying to drive to the hospital but couldn't find a hospital. He stopped at a festival where there was a Emergency planning/management officer who called 911. EMS arrived and her blood sugar was 64 that gave her D10 and it came up 220. Patient has no known diabetes and is not on medicine. She does have a history of prediabetes. The husband is now seeing her again after EMS took her and states that she is back to her baseline. The husband states it seemed like she was very still and might of been asleep but he does not think she passed out when he was driving. She had no complaints such as headache or chest pain. Patient tells me she got "weak" while in the car. However she has trouble telling me exactly what this means and what else was going on.  Past Medical History:  Diagnosis Date  . Diabetes mellitus without complication (HCC)   . Hyperlipidemia   . Hypertension   . Stroke [facility])     Patient Active  Problem List   Diagnosis Date Noted  . Hypertension 09/24/2016  . History of CVA (cerebrovascular accident) 09/24/2016  . 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 Hyperlipidemia 09/24/2016    No past surgical history on file.  OB History    No data available       Home Medications    Prior to Admission medications   Medication Sig Start Date End Date Taking? Authorizing Provider  clonazePAM (KLONOPIN) 1 MG tablet Take 0.5 mg by mouth 3 (three) times daily.   Yes [provider]  folic acid (FOLVITE) 1 MG tablet Take 1 mg by mouth daily.   Yes [provider]  Lacosamide (VIMPAT) 150 MG TABS Take 150 mg by mouth 2 (two) times daily.   Yes [provider]  LamoTRIgine (LAMICTAL XR) 50 MG TB24 Take 50 mg by mouth 2 (two) times daily.   Yes [provider]  LamoTRIgine 300 MG TB24 Take 300 mg by mouth daily.   Yes [provider]  metoprolol tartrate (LOPRESSOR) 25 MG tablet Take 25 mg by mouth 2 (two) times daily.   Yes [provider]  potassium chloride (K-DUR) 10 MEQ tablet Take 10 mEq by mouth daily.   Yes [provider]  pravastatin (PRAVACHOL) 10 MG tablet Take 10 mg by mouth  daily.   Yes [provider]  valsartan-hydrochlorothiazide (DIOVAN-HCT) 320-25 MG tablet Take 1 tablet by mouth daily.   Yes [provider]  Vitamin D, Ergocalciferol, (DRISDOL) 50000 units CAPS capsule Take 50,000 Units by mouth every 7 (seven) days. On Sundays   Yes [provider]    Family History No family history on file.  Social History Social History  Substance Use Topics  . Smoking status: Never Smoker  . Smokeless tobacco: Never Used  . Alcohol use No     Allergies   Patient has no known allergies.   Review of Systems Review of Systems  Unable to perform ROS: Mental status change     Physical Exam Updated Vital Signs BP 132/81   Pulse (!) 55   Temp 98 F  (36.7 C) (Oral)   Resp 12   SpO2 96%   Physical Exam  Constitutional: She appears well-developed and well-nourished.  HENT:  Head: Normocephalic and atraumatic.  Right Ear: External ear normal.  Left Ear: External ear normal.  Nose: Nose normal.  Eyes: Right eye exhibits no discharge. Left eye exhibits no discharge.  Cardiovascular: Normal rate, regular rhythm and normal heart sounds.   Pulmonary/Chest: Effort normal and breath sounds normal.  Abdominal: Soft. There is no tenderness.  Neurological: She is alert. She is disoriented.  She thinks it's yesterday which corresponds to the end of the month. This persists even after she has been corrected and asked later. CN 3-12 grossly intact. 5/5 strength in all 4 extremities. Grossly normal sensation. When asked to do finger to nose, she is unable to complete this, keeps her finger on her nose and seems confused.   Skin: Skin is warm and dry.  Nursing note and vitals reviewed.    ED Treatments / Results  Labs (all labs ordered are listed, but only abnormal results are displayed) Labs Reviewed  COMPREHENSIVE METABOLIC PANEL - Abnormal; Notable for the following:       Result Value   Potassium 3.1 (*)    Creatinine, Ser 1.08 (*)    GFR calc non Af Amer 52 (*)    GFR calc Af Amer 60 (*)    All other components within normal limits  URINALYSIS, ROUTINE W REFLEX MICROSCOPIC - Abnormal; Notable for the following:    Color, Urine STRAW (*)    Hgb urine dipstick SMALL (*)    Squamous Epithelial / LPF 0-5 (*)    All other components within normal limits  CBC WITH DIFFERENTIAL/PLATELET  ETHANOL  RAPID URINE DRUG SCREEN, HOSP PERFORMED  TROPONIN I  TROPONIN I  PROTIME-INR  APTT  CBG MONITORING, ED  CBG MONITORING, ED    EKG  EKG Interpretation  Date/Time:  Saturday June 14 2017 18:40:53 EDT Ventricular Rate:  56 PR Interval:    QRS Duration: 99 QT Interval:  420 QTC Calculation: 406 R Axis:   -15 Text  Interpretation:  Sinus rhythm Prolonged PR interval Borderline left axis deviation nonspecific T wave flattening new since Dec 2017 Confirmed by Pricilla LovelessGoldston, Jaid Quirion [phone]([id number]) on 06/14/2017 7:05:58 PM       Radiology Ct Head Wo Contrast  Result Date: 06/14/2017 CLINICAL DATA:  Altered mental status EXAM: CT HEAD WITHOUT CONTRAST TECHNIQUE: Contiguous axial images were obtained from the base of the skull through the vertex without intravenous contrast. COMPARISON:  09/24/2016, FINDINGS: Brain: No acute territorial infarction, hemorrhage or mass is seen. Large old area of encephalomalacia within the left parietal and occipital lobes with ex vacuo  dilatation of the left ventricle. Moderate small vessel ischemic changes of the white matter. No midline shift. Vascular: Metallic artifact near the left cavernous carotid artery. No hyperdense vessels Skull: No fracture Sinuses/Orbits: No acute finding. Other: None IMPRESSION: 1. No CT evidence for acute intracranial abnormality. 2. Similar appearance of old left-sided infarct. 3. Moderate to marked small vessel ischemic changes of the white matter. Electronically Signed   By: Jasmine Pang M.D.   On: 06/14/2017 20:26    Procedures Procedures (including critical care time)  Medications Ordered in ED Medications - No data to display   Initial Impression / Assessment and Plan / ED Course  I have reviewed the triage vital signs and the nursing notes.  Pertinent labs & imaging results that were available during my care of the patient were reviewed by me and considered in my medical decision making (see chart for details).     No clear cause for transient AMS. She did not pass out. Given prolonged episode, less likely near-syncope or cardiac event. Back to baseline in the ED. When further discussing with husband, he states this is similar to how she's been after a seizure. It's possible she had a seizure while he was away and was post ictal. I doubt it was  hypoglycemia, glucose was over 60. Plan to d/c with PCP and neuro f/u. No change in meds at this time. Discussed return precautions. No seizure like activity in the ED  Final Clinical Impressions(s) / ED Diagnoses   Final diagnoses:  Transient alteration of awareness    New Prescriptions Discharge Medication List as of 06/14/2017 10:49 PM       Pricilla Loveless, MD 06/15/17 [phone]

## 2017-06-14 NOTE — Discharge Instructions (Signed)
Return to the ER or call 911 if this occurs again. This may have been a seizure since she is now back to normal. Continue taking your seizure medicines as prescribed.

## 2017-06-14 NOTE — ED Notes (Signed)
Pt seemed confused when the writer was helping her provide urine specimen. She needed coaching to wash her hands. After urinating she said, "I need to get these clean." The writer instructed her how to wash her hands.

## 2017-06-19 ENCOUNTER — Encounter (HOSPITAL_COMMUNITY): Payer: Self-pay | Admitting: Emergency Medicine

## 2017-06-19 ENCOUNTER — Observation Stay (HOSPITAL_COMMUNITY)
Admission: EM | Admit: 2017-06-19 | Discharge: 2017-06-22 | Disposition: A | Discharge status: Home / Self Care | Payer: Medicare HMO | Attending: Internal Medicine | Admitting: Internal Medicine

## 2017-06-19 DIAGNOSIS — G9389 Other specified disorders of brain: Secondary | ICD-10-CM | POA: Insufficient documentation

## 2017-06-19 DIAGNOSIS — I10 Essential (primary) hypertension: Secondary | ICD-10-CM | POA: Diagnosis not present

## 2017-06-19 DIAGNOSIS — R001 Bradycardia, unspecified: Secondary | ICD-10-CM | POA: Insufficient documentation

## 2017-06-19 DIAGNOSIS — R569 Unspecified convulsions: Secondary | ICD-10-CM | POA: Diagnosis present

## 2017-06-19 DIAGNOSIS — G40901 Epilepsy, unspecified, not intractable, with status epilepticus: Principal | ICD-10-CM | POA: Insufficient documentation

## 2017-06-19 DIAGNOSIS — N289 Disorder of kidney and ureter, unspecified: Secondary | ICD-10-CM | POA: Insufficient documentation

## 2017-06-19 DIAGNOSIS — E785 Hyperlipidemia, unspecified: Secondary | ICD-10-CM | POA: Insufficient documentation

## 2017-06-19 DIAGNOSIS — H53469 Homonymous bilateral field defects, unspecified side: Secondary | ICD-10-CM | POA: Insufficient documentation

## 2017-06-19 DIAGNOSIS — Z79899 Other long term (current) drug therapy: Secondary | ICD-10-CM | POA: Insufficient documentation

## 2017-06-19 DIAGNOSIS — I69320 Aphasia following cerebral infarction: Secondary | ICD-10-CM | POA: Diagnosis not present

## 2017-06-19 DIAGNOSIS — E876 Hypokalemia: Secondary | ICD-10-CM | POA: Diagnosis not present

## 2017-06-19 DIAGNOSIS — E119 Type 2 diabetes mellitus without complications: Secondary | ICD-10-CM | POA: Insufficient documentation

## 2017-06-19 DIAGNOSIS — I69351 Hemiplegia and hemiparesis following cerebral infarction affecting right dominant side: Secondary | ICD-10-CM | POA: Diagnosis not present

## 2017-06-19 HISTORY — DX: Unspecified convulsions: R56.9

## 2017-06-19 LAB — URINALYSIS, ROUTINE W REFLEX MICROSCOPIC
BILIRUBIN URINE: NEGATIVE
Bacteria, UA: NONE SEEN
Glucose, UA: NEGATIVE mg/dL
Ketones, ur: NEGATIVE mg/dL
Leukocytes, UA: NEGATIVE
NITRITE: NEGATIVE
PROTEIN: NEGATIVE mg/dL
Specific Gravity, Urine: 1.01 (ref 1.005–1.030)
pH: 6 (ref 5.0–8.0)

## 2017-06-19 LAB — BASIC METABOLIC PANEL
ANION GAP: 7 (ref 5–15)
BUN: 16 mg/dL (ref 6–20)
CHLORIDE: 102 mmol/L (ref 101–111)
CO2: 32 mmol/L (ref 22–32)
CREATININE: 1.23 mg/dL — AB (ref 0.44–1.00)
Calcium: 9 mg/dL (ref 8.9–10.3)
GFR calc non Af Amer: 44 mL/min — ABNORMAL LOW (ref >60)
GFR, EST AFRICAN AMERICAN: 51 mL/min — AB (ref >60)
Glucose, Bld: 97 mg/dL (ref 65–99)
POTASSIUM: 3.1 mmol/L — AB (ref 3.5–5.1)
SODIUM: 141 mmol/L (ref 135–145)

## 2017-06-19 LAB — CBC WITH DIFFERENTIAL/PLATELET
Basophils Absolute: 0 10*3/uL (ref 0.0–0.1)
Basophils Relative: 1 %
EOS ABS: 0.1 10*3/uL (ref 0.0–0.7)
Eosinophils Relative: 2 %
HCT: 34.8 % — ABNORMAL LOW (ref 36.0–46.0)
HEMOGLOBIN: 11.4 g/dL — AB (ref 12.0–15.0)
LYMPHS PCT: 30 %
Lymphs Abs: 2 10*3/uL (ref 0.7–4.0)
MCH: 29.8 pg (ref 26.0–34.0)
MCHC: 32.8 g/dL (ref 30.0–36.0)
MCV: 90.9 fL (ref 78.0–100.0)
Monocytes Absolute: 0.4 10*3/uL (ref 0.1–1.0)
Monocytes Relative: 6 %
NEUTROS ABS: 4.1 10*3/uL (ref 1.7–7.7)
NEUTROS PCT: 63 %
Platelets: 205 10*3/uL (ref 150–400)
RBC: 3.83 MIL/uL — AB (ref 3.87–5.11)
RDW: 13.3 % (ref 11.5–15.5)
WBC: 6.6 10*3/uL (ref 4.0–10.5)

## 2017-06-19 LAB — RAPID URINE DRUG SCREEN, HOSP PERFORMED
Amphetamines: NOT DETECTED
BARBITURATES: NOT DETECTED
Benzodiazepines: POSITIVE — AB
Cocaine: NOT DETECTED
Opiates: NOT DETECTED
TETRAHYDROCANNABINOL: NOT DETECTED

## 2017-06-19 LAB — ETHANOL: Alcohol, Ethyl (B): <5 mg/dL (ref <5)

## 2017-06-19 LAB — PHOSPHORUS: Phosphorus: 3 mg/dL (ref 2.5–4.6)

## 2017-06-19 LAB — MAGNESIUM: MAGNESIUM: 2.4 mg/dL (ref 1.7–2.4)

## 2017-06-19 LAB — CBG MONITORING, ED: Glucose-Capillary: 84 mg/dL (ref 65–99)

## 2017-06-19 MED ORDER — VITAMIN D (ERGOCALCIFEROL) 1.25 MG (50000 UNIT) PO CAPS
50000.0000 [IU] | ORAL_CAPSULE | ORAL | Status: DC
  Administered 2017-06-22: 50000 [IU] via ORAL
  Filled 2017-06-19: qty 1

## 2017-06-19 MED ORDER — LAMOTRIGINE 100 MG PO TABS
200.0000 mg | ORAL_TABLET | Freq: Two times a day (BID) | ORAL | Status: DC
  Administered 2017-06-19 – 2017-06-22 (×8): 200 mg via ORAL
  Filled 2017-06-19 (×8): qty 2

## 2017-06-19 MED ORDER — LAMOTRIGINE 100 MG PO TABS: 200.0000 mg | ORAL_TABLET | Freq: Two times a day (BID) | ORAL | Status: DC

## 2017-06-19 MED ORDER — FOLIC ACID 1 MG PO TABS
1.0000 mg | ORAL_TABLET | Freq: Every day | ORAL | Status: DC
  Administered 2017-06-19 – 2017-06-22 (×4): 1 mg via ORAL
  Filled 2017-06-19 (×3): qty 1

## 2017-06-19 MED ORDER — IRBESARTAN 150 MG PO TABS
150.0000 mg | ORAL_TABLET | Freq: Every day | ORAL | Status: DC
  Administered 2017-06-19 – 2017-06-22 (×4): 150 mg via ORAL
  Filled 2017-06-19 (×4): qty 1

## 2017-06-19 MED ORDER — POTASSIUM CHLORIDE CRYS ER 20 MEQ PO TBCR
20.0000 meq | EXTENDED_RELEASE_TABLET | Freq: Every day | ORAL | Status: DC
  Administered 2017-06-19 – 2017-06-22 (×4): 20 meq via ORAL
  Filled 2017-06-19 (×3): qty 1

## 2017-06-19 MED ORDER — POTASSIUM CHLORIDE CRYS ER 20 MEQ PO TBCR
EXTENDED_RELEASE_TABLET | ORAL | Status: AC
  Filled 2017-06-19: qty 1

## 2017-06-19 MED ORDER — LORAZEPAM 2 MG/ML IJ SOLN: 2.0000 mg | INTRAMUSCULAR | Status: DC | PRN

## 2017-06-19 MED ORDER — CLONAZEPAM 0.5 MG PO TABS
0.5000 mg | ORAL_TABLET | Freq: Three times a day (TID) | ORAL | Status: DC
  Administered 2017-06-19 – 2017-06-22 (×11): 0.5 mg via ORAL
  Filled 2017-06-19 (×11): qty 1

## 2017-06-19 MED ORDER — ENOXAPARIN SODIUM 40 MG/0.4ML ~~LOC~~ SOLN
40.0000 mg | SUBCUTANEOUS | Status: DC
Start: 2017-06-19 — End: 2017-06-22
  Administered 2017-06-19 – 2017-06-20 (×2): 40 mg via SUBCUTANEOUS
  Filled 2017-06-19 (×3): qty 0.4

## 2017-06-19 MED ORDER — ACETAMINOPHEN 650 MG RE SUPP: 650.0000 mg | Freq: Four times a day (QID) | RECTAL | Status: DC | PRN

## 2017-06-19 MED ORDER — SODIUM CHLORIDE 0.9 % IV SOLN
200.0000 mg | Freq: Two times a day (BID) | INTRAVENOUS | Status: AC
  Administered 2017-06-19 – 2017-06-20 (×3): 200 mg via INTRAVENOUS
  Filled 2017-06-19 (×4): qty 20

## 2017-06-19 MED ORDER — METOPROLOL TARTRATE 25 MG PO TABS: 25.0000 mg | ORAL_TABLET | Freq: Two times a day (BID) | ORAL | Status: DC

## 2017-06-19 MED ORDER — POTASSIUM CHLORIDE IN NACL 20-0.9 MEQ/L-% IV SOLN
INTRAVENOUS | Status: DC
  Administered 2017-06-19: 11:00:00 via INTRAVENOUS
  Filled 2017-06-19 (×2): qty 1000

## 2017-06-19 MED ORDER — PRAVASTATIN SODIUM 20 MG PO TABS
10.0000 mg | ORAL_TABLET | Freq: Every day | ORAL | Status: DC
  Administered 2017-06-19 – 2017-06-22 (×4): 10 mg via ORAL
  Filled 2017-06-19 (×4): qty 1

## 2017-06-19 MED ORDER — FOLIC ACID 1 MG PO TABS
ORAL_TABLET | ORAL | Status: AC
  Filled 2017-06-19: qty 1

## 2017-06-19 MED ORDER — ACETAMINOPHEN 325 MG PO TABS: 650.0000 mg | ORAL_TABLET | Freq: Four times a day (QID) | ORAL | Status: DC | PRN

## 2017-06-19 NOTE — H&P (Addendum)
History and Physical    Angela DohenyCarolyn Tourangeau ION:[id number]:[id number] DOB: 06-18-1951 DOA: 06/19/2017  PCP: Caffie DammeSmith, Karla, MD  Patient coming from:Home  Chief Complaint:seizure  HPI: Angela Fields is a 66 y.o. female with medical history significant of a stroke with aphasia and right hemianopia, hypertension, hyperlipidemia, seizure disorder brought into the hospital by her husband because of about 8 episodes of seizure over an hour last night and was unresponsive and therefore EMS was called. Patient was given Versed en route for facial twitching. Patient known to have seizure disorder and takes medication. As per medical record. She was compliant with her medications. Patient has Aphasia but able to answer some questions. Denied headache, dizziness, nausea, vomiting, chest pain or shortness of breath. She feels cold and wants extra blanket.  ED Course: In the ER CT scan of head with old left-sided infarct with no acute finding. Evaluated by neurologist who adjusted seizure medication and recommended to observe patient today. I spoke with the ER physician. Labs with hypokalemia, serum creatinine level I.23 consistent with her baseline.  Review of Systems: As per HPI otherwise 10 point review of systems negative.    Past Medical History:  Diagnosis Date  . Diabetes mellitus without complication (HCC)   . Hyperlipidemia   . Hypertension   . Seizures (HCC)   . Stroke [facility](HCC)     Surgical History reviewed. No pertinent surgical history.   Social history reports that she has never smoked. She has never used smokeless tobacco. She reports that she does not drink alcohol or use drugs.  No Known Allergies, chart reviewed  Family History reviewed. No pertinent family history.   Prior to Admission medications   Medication Sig Start Date End Date Taking? Authorizing Provider  clonazePAM (KLONOPIN) 1 MG tablet Take 0.5 mg by mouth 3 (three) times daily.   Yes [provider]  folic acid (FOLVITE) 1 MG  tablet Take 1 mg by mouth daily.   Yes [provider]  Lacosamide (VIMPAT) 150 MG TABS Take 150 mg by mouth 2 (two) times daily.   Yes [provider]  LamoTRIgine (LAMICTAL XR) 50 MG TB24 Take 50 mg by mouth 2 (two) times daily.   Yes [provider]  LamoTRIgine 300 MG TB24 Take 300 mg by mouth daily.   Yes [provider]  metoprolol tartrate (LOPRESSOR) 25 MG tablet Take 25 mg by mouth 2 (two) times daily.   Yes [provider]  potassium chloride (K-DUR) 10 MEQ tablet Take 10 mEq by mouth daily.   Yes [provider]  pravastatin (PRAVACHOL) 10 MG tablet Take 10 mg by mouth daily.   Yes [provider]  valsartan-hydrochlorothiazide (DIOVAN-HCT) 320-25 MG tablet Take 1 tablet by mouth daily.   Yes [provider]  Vitamin D, Ergocalciferol, (DRISDOL) 50000 units CAPS capsule Take 50,000 Units by mouth every 7 (seven) days. On Sundays   Yes [provider]    Physical Exam: Vitals:   06/19/17 0431 06/19/17 0438 06/19/17 0505  BP:   (!) 144/65  Pulse:   (!) 56  Resp:   19  Temp:   (!) 97.5 F (36.4 C)  TempSrc:   Oral  SpO2: 100% 100% 93%      Constitutional: NAD, calm, comfortable Vitals:   06/19/17 0431 06/19/17 0438 06/19/17 0505  BP:   (!) 144/65  Pulse:   (!) 56  Resp:   19  Temp:   (!) 97.5 F (36.4 C)  TempSrc:   Oral  SpO2: 100% 100% 93%   Eyes: PERRL ENMT: Mucous membranes are moist. Normal dentition.  Neck: normal, supple Respiratory: clear to auscultation bilaterally, no wheezing, no crackles. Normal respiratory effort. No accessory muscle use.  Cardiovascular: Regular rate and rhythm, S1S2 Normal. no murmurs / rubs / gallops.  Abdomen: Soft, Non-tender, non-distended. Bowel sounds positive.  Musculoskeletal: no clubbing / cyanosis. No extremity edema.   Skin: no rashes, lesions, ulcers. No induration Neurologic: Alert, awake, following simple commands. She has aphasia.    Psychiatric: Difficult to assess because of her aphasia. She is however alert awake and oriented to hospital, September and her name. Not oriented to year.    Labs on Admission: I have personally reviewed following labs and imaging studies  CBC:  Recent Labs Lab 06/14/17 1905 06/19/17 0540  WBC 5.7 6.6  NEUTROABS 3.1 4.1  HGB 12.0 11.4*  HCT 36.4 34.8*  MCV 90.5 90.9  PLT 194 205   Basic Metabolic Panel:  Recent Labs Lab 06/14/17 1905 06/19/17 0540  NA 142 141  K 3.1* 3.1*  CL 102 102  CO2 32 32  GLUCOSE 81 97  BUN 14 16  CREATININE 1.08* 1.23*  CALCIUM 9.0 9.0  MG  --  2.4  PHOS  --  3.0   GFR: CrCl cannot be calculated (Unknown ideal weight.). Liver Function Tests:  Recent Labs Lab 06/14/17 1905  AST 16  ALT 15  ALKPHOS 61  BILITOT 0.4  PROT 7.1  ALBUMIN 4.1   No results for input(s): LIPASE, AMYLASE in the last 168 hours. No results for input(s): AMMONIA in the last 168 hours. Coagulation Profile: No results for input(s): INR, PROTIME in the last 168 hours. Cardiac Enzymes:  Recent Labs Lab 06/14/17 2140  TROPONINI <0.03   BNP (last 3 results) No results for input(s): PROBNP in the last 8760 hours. HbA1C: No results for input(s): HGBA1C in the last 72 hours. CBG:  Recent Labs Lab 06/14/17 1804 06/14/17 2038 06/19/17 0539  GLUCAP 85 71 84   Lipid Profile: No results for input(s): CHOL, HDL, LDLCALC, TRIG, CHOLHDL, LDLDIRECT in the last 72 hours. Thyroid Function Tests: No results for input(s): TSH, T4TOTAL, FREET4, T3FREE, THYROIDAB in the last 72 hours. Anemia Panel: No results for input(s): VITAMINB12, FOLATE, FERRITIN, TIBC, IRON, RETICCTPCT in the last 72 hours. Urine analysis:    Component Value Date/Time   COLORURINE STRAW (A) 06/14/2017 1905   APPEARANCEUR CLEAR 06/14/2017 1905   LABSPEC 1.009 06/14/2017 1905   PHURINE 5.0 06/14/2017 1905   GLUCOSEU NEGATIVE 06/14/2017 1905   HGBUR SMALL (A) 06/14/2017 1905    BILIRUBINUR NEGATIVE 06/14/2017 1905   KETONESUR NEGATIVE 06/14/2017 1905   PROTEINUR NEGATIVE 06/14/2017 1905   NITRITE NEGATIVE 06/14/2017 1905   LEUKOCYTESUR NEGATIVE 06/14/2017 1905   Sepsis Labs: !!!!!!!!!!!!!!!!!!!!!!!!!!!!!!!!!!!!!!!!!!!! (procalcitonin:4,lacticidven:4) )No results found for this or any previous visit (from the past 240 hour(s)).   Radiological Exams on Admission: No results found.  EKG: Independently reviewed. Sinus bradycardia to 49 heart rate.  Assessment/Plan Active Problems:   Seizure [facility])  # Seizure disorder/status epilepticus: Patient with history of epilepsy presented with multiple episodes of seizure episodes and unresponsive at home. CT head with no acute finding. Evaluated by neurologist. Concerning that there might be  mismatch with the patient's medication intake ?missing dose. -Neurologist increase the dose lacosamide to 200 twice a day, continue Lamictal and clonazepam. I ordered IV Ativan as needed for seizure episode. Seizure precaution. Neurologist recommended to observe today for patients seizure episode  on clinical improvement.   #History of a stroke: PT OT evaluation. CT head with no acute finding  #Hypokalemia: Replete potassium chloride. Monitor lab in the morning. Holding hydrochlorothiazide.  #Sinus bradycardia: Patient takes metoprolol at home. I will hold today. Continue to monitor. Tele-monitoring.  # History of hypertension: Blood pressure acceptable. Resume irbesartan (valsartan not available). Weaning hydrochlorothiazide and metoprolol. Monitor blood pressure.  #Hyperlipidemia: Continue pravastatin.  DVT prophylaxis: Lovenox subcutaneous Code Status: Full code Family Communication: No family in the ER Disposition Plan: Observation Consults called: Neurologist by ER Admission status: Observation   Marsena Taff Jaynie Collins MD [facility] Pager [phone]  If 7PM-7AM, please contact  night-coverage [email or web address] Password [email or web address]  06/19/2017, 8:00 AM

## 2017-06-19 NOTE — Consult Note (Signed)
Neurology Consultation Reason for Consult: seizures Referring Physician: Rhunette CroftNanavati, A  CC: Seizures  History is obtained from:patient, husband  HPI: Angela DohenyCarolyn Eisner is a 66 y.o. female with a history of stroke leaving her with aphasia and right hemianopia presents with seizure flurry. Her husband states that he thinks she might have missed her evening medications. She currently takes Lamictal XR, total 400 mg per day divided as 350 mg in the morning and 50 mg in the evening, lacosamide 150 mg twice a day, and clonazepam 0.5 mg 3 times a day. He estimates she might have had as many as 8 seizures.  She currently is doing much better,since receiving diazepam via EMS. She was confused on arrival, but has been gradually improving.  She is still having some difficulty with her speech, but is able to tell me that she has "aphasia."   ROS: Unable to assess secondary to patient's altered mental status.    Past Medical History:  Diagnosis Date  . Diabetes mellitus without complication (HCC)   . Hyperlipidemia   . Hypertension   . Seizures (HCC)   . Stroke [facility](HCC)      FHx: Unable to assess secondary to patient's altered mental status.     Social History:  reports that she has never smoked. She has never used smokeless tobacco. She reports that she does not drink alcohol or use drugs.   Exam: Current vital signs: BP (!) 144/65 (BP Location: Left Arm)   Pulse (!) 56   Temp (!) 97.5 F (36.4 C) (Oral)   Resp 19   SpO2 93%  Vital signs in last 24 hours: Temp:  [97.5 F (36.4 C)] 97.5 F (36.4 C) (09/06 0505) Pulse Rate:  [56] 56 (09/06 0505) Resp:  [19] 19 (09/06 0505) BP: (144)/(65) 144/65 (09/06 0505) SpO2:  [93 %-100 %] 93 % (09/06 0505)   Physical Exam  Constitutional: Appears well-developed and well-nourished.  Psych: Affect appropriate to situation Eyes: No scleral injection HENT: No OP obstrucion Head: Normocephalic.  Cardiovascular: Normal rate and regular rhythm.   Respiratory: Effort normal and breath sounds normal to anterior ascultation GI: Soft.  No distension. There is no tenderness.  Skin: WDI  Neuro: Mental Status: Patient is awake, alert, gives month as October, she has a moderate expressive aphasia Patient is able to give a clear and coherent history. No signs of neglect Cranial Nerves: II: dense right hemianopia. Pupils are equal, round, and reactive to light.   III,IV, VI: EOMI without ptosis or diploplia.  V: Facial sensation is symmetric to temperature VII: Facial movement is symmetric.  VIII: hearing is intact to voice X: Uvula elevates symmetrically XI: Shoulder shrug is symmetric. XII: tongue is midline without atrophy or fasciculations.  Motor: Tone is normal. Bulk is normal. 5/5 strength was present in all four extremities.  Sensory: Sensation is symmetric to light touch and temperature in the arms and legs. Cerebellar: She has mild difficulty with finger-nose-finger bilaterally, unclear if due to visual problems versus mild ataxia     I have reviewed labs in epic and the results pertinent to this consultation are: Creatinine 1.23  I have reviewed the images obtained:CT head from 9/1-old left posterior infarct  Impression: 66 year old female with a history of epilepsy with breakthrough seizure flurry. Though it is possible that this happened in the setting of a single dose being missed, with it happening twice in the span of a week, I would favor increasing her lacosamide dose slightly.  Recommendations: 1) increase lacosamide  200 mg twice a day 2) continue Lamictal at 400 mg daily (substituting XR tablet 350 mg every morning and 50 mg every afternoon for IR tablet 200 mg twice a day could switch back to home dosage on discharge) 3) continue clonazepam at 0.5 mg 3 times a day 4) it would be reasonable to observe the patient to make sure she continues to improve.  Ritta Slot, MD [facility] [phone]  If 7pm- 7am, please page neurology on call as listed in AMION.

## 2017-06-19 NOTE — ED Notes (Signed)
Sallie accepting RN [phone]. 1440 report time

## 2017-06-19 NOTE — ED Triage Notes (Signed)
Pt arrived by EMS from home for seizures. Patient has hx of CVA and epilepsy, per husband takes medications as prescribed. Per husband, 1 hour PTA patient had between 8-9 seizures. Patient given 2.5 mg midazolam en route. EMS reports focal-like seizure activity with facial twitching and right-sided gaze en route.

## 2017-06-19 NOTE — ED Provider Notes (Signed)
[facility]-EMERGENCY DEPT Provider Note   CSN: [id number] Arrival date & time: 06/19/17  0431     History   Chief Complaint Chief Complaint  Patient presents with  . Seizures    HPI Angela Fields is a 66 y.o. female.  HPI Pt with hx of seizures, DM, strokes comes in with cc of seizures. LEVEL 5 CAVEAT FOR POST ICTAL PHASE.  I called Angela Fields, the husband, who reports that pt started having seizures overnight and had about 8 episodes of seizures over an hour and was unresponsive - so he called EMS. Pt was given 2.5 mg versed en route for facial twitching. Per husband pt's seizures are well controlled and she is compliant with her meds. He deneis any recent trauma, med changes, fevers.   PT is oriented to self and location, but unable to give any further details about her medical hx.  Past Medical History:  Diagnosis Date  . Diabetes mellitus without complication (HCC)   . Hyperlipidemia   . Hypertension   . Seizures (HCC)   . Stroke Angela Fields(HCC)     Patient Active Problem List   Diagnosis Date Noted  . Hypertension 09/24/2016  . History of CVA (cerebrovascular accident) 09/24/2016  . 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 Hyperlipidemia 09/24/2016    History reviewed. No pertinent surgical history.  OB History    No data available       Home Medications    Prior to Admission medications   Medication Sig Start Date End Date Taking? Authorizing Provider  clonazePAM (KLONOPIN) 1 MG tablet Take 0.5 mg by mouth 3 (three) times daily.   Yes [provider]  folic acid (FOLVITE) 1 MG tablet Take 1 mg by mouth daily.   Yes [provider]  Lacosamide (VIMPAT) 150 MG TABS Take 150 mg by mouth 2 (two) times daily.   Yes [provider]  LamoTRIgine (LAMICTAL XR) 50 MG TB24 Take 50 mg by mouth 2 (two) times daily.   Yes [provider]  LamoTRIgine 300 MG TB24 Take 300 mg by mouth daily.   Yes [provider]    metoprolol tartrate (LOPRESSOR) 25 MG tablet Take 25 mg by mouth 2 (two) times daily.   Yes [provider]  potassium chloride (K-DUR) 10 MEQ tablet Take 10 mEq by mouth daily.   Yes [provider]  pravastatin (PRAVACHOL) 10 MG tablet Take 10 mg by mouth daily.   Yes [provider]  valsartan-hydrochlorothiazide (DIOVAN-HCT) 320-25 MG tablet Take 1 tablet by mouth daily.   Yes [provider]  Vitamin D, Ergocalciferol, (DRISDOL) 50000 units CAPS capsule Take 50,000 Units by mouth every 7 (seven) days. On Sundays   Yes [provider]    Family History History reviewed. No pertinent family history.  Social History Social History  Substance Use Topics  . Smoking status: Never Smoker  . Smokeless tobacco: Never Used  . Alcohol use No     Allergies   Patient has no known allergies.   Review of Systems Review of Systems  Unable to perform ROS: Mental status change     Physical Exam Updated Vital Signs BP (!) 144/65 (BP Location: Left Arm)   Pulse (!) 56   Temp (!) 97.5 F (36.4 C) (Oral)   Resp 19   SpO2 93%   Physical Exam  Constitutional: She appears well-developed.  HENT:  Head: Normocephalic and atraumatic.  No tongue biting  Eyes: EOM are normal.  Neck: Normal range of motion. Neck supple.  Cardiovascular: Normal rate.   Pulmonary/Chest: Effort normal.  Abdominal: Bowel sounds are normal.  Neurological: No cranial nerve deficit.  Confused, oriented to self and location.  Skin: Skin is warm and dry.  Nursing note and vitals reviewed.    ED Treatments / Results  Labs (all labs ordered are listed, but only abnormal results are displayed) Labs Reviewed  BASIC METABOLIC PANEL - Abnormal; Notable for the following:       Result Value   Potassium 3.1 (*)    Creatinine, Ser 1.23 (*)    GFR calc non Af Amer 44 (*)    GFR calc Af Amer 51 (*)    All other components within normal limits  CBC WITH  DIFFERENTIAL/PLATELET - Abnormal; Notable for the following:    RBC 3.83 (*)    Hemoglobin 11.4 (*)    HCT 34.8 (*)    All other components within normal limits  MAGNESIUM  PHOSPHORUS  ETHANOL  URINALYSIS, ROUTINE W REFLEX MICROSCOPIC  RAPID URINE DRUG SCREEN, HOSP PERFORMED  CBG MONITORING, ED  CBG MONITORING, ED    EKG  EKG Interpretation  Date/Time:  Thursday June 19 2017 05:11:25 EDT Ventricular Rate:  49 PR Interval:    QRS Duration: 109 QT Interval:  434 QTC Calculation: 392 R Axis:   -11 Text Interpretation:  Sinus bradycardia Prolonged PR interval Abnormal R-wave progression, early transition No acute changes No significant change since last tracing Confirmed by Derwood Kaplan ([id number]) on 06/19/2017 5:35:17 AM       Radiology No results found.  Procedures Procedures (including critical care time)  Medications Ordered in ED Medications  lacosamide (VIMPAT) 200 mg in sodium chloride 0.9 % 25 mL IVPB (not administered)  clonazePAM (KLONOPIN) tablet 0.5 mg (not administered)  lamoTRIgine (LAMICTAL) tablet 200 mg (not administered)     Initial Impression / Assessment and Plan / ED Course  I have reviewed the triage vital signs and the nursing notes.  Pertinent labs & imaging results that were available during my care of the patient were reviewed by me and considered in my medical decision making (see chart for details).  Clinical Course as of Jun 20 715  Thu Jun 19, 2017  0716 Neuro recommends obs admission and medication changes. They will place their recs.  [AN]    Clinical Course User Index [AN] Derwood Kaplan, MD    It seems like patient had status epilepticus and is post ictal.  DDx: -Seizure disorder -Meningitis -Trauma -ICH -Electrolyte abnormality -Metabolic derangement -Stroke -Toxin induced seizures -Medication side effects -Hypoxia -Hypoglycemia  Pt is protecting airway. Seizures not witnessed by paramedics. We will get basic  labs. Pt was seen for AMS recently, and was thought to be post ictal at that time - likely because her mental status improved over time. With continued  alleged seizures and AMS - we will consult neurology.  Final Clinical Impressions(s) / ED Diagnoses   Final diagnoses:  Status epilepticus [facility])    New Prescriptions New Prescriptions   No medications on file     Derwood Kaplan, MD 06/19/17 [phone]

## 2017-06-20 DIAGNOSIS — G40901 Epilepsy, unspecified, not intractable, with status epilepticus: Secondary | ICD-10-CM | POA: Diagnosis not present

## 2017-06-20 DIAGNOSIS — R569 Unspecified convulsions: Secondary | ICD-10-CM | POA: Diagnosis not present

## 2017-06-20 LAB — BASIC METABOLIC PANEL
Anion gap: 5 (ref 5–15)
BUN: 9 mg/dL (ref 6–20)
CALCIUM: 8.8 mg/dL — AB (ref 8.9–10.3)
CO2: 28 mmol/L (ref 22–32)
CREATININE: 1.06 mg/dL — AB (ref 0.44–1.00)
Chloride: 108 mmol/L (ref 101–111)
GFR calc non Af Amer: 53 mL/min — ABNORMAL LOW (ref >60)
Glucose, Bld: 90 mg/dL (ref 65–99)
Potassium: 4 mmol/L (ref 3.5–5.1)
Sodium: 141 mmol/L (ref 135–145)

## 2017-06-20 MED ORDER — POTASSIUM CHLORIDE CRYS ER 20 MEQ PO TBCR
40.0000 meq | EXTENDED_RELEASE_TABLET | Freq: Once | ORAL | Status: AC
  Administered 2017-06-20: 40 meq via ORAL
  Filled 2017-06-20: qty 2

## 2017-06-20 MED ORDER — LACOSAMIDE 50 MG PO TABS
200.0000 mg | ORAL_TABLET | Freq: Two times a day (BID) | ORAL | Status: DC
  Administered 2017-06-20 – 2017-06-22 (×4): 200 mg via ORAL
  Filled 2017-06-20 (×4): qty 4

## 2017-06-20 NOTE — Evaluation (Signed)
Physical Therapy Evaluation Patient Details Name: Angela Fields MRN: [id number] DOB: [birth year] Today's Date: 06/20/2017   History of Present Illness  Angela Fields is a 65 y.o. female with medical history significant of a stroke with aphasia and right hemianopia, hypertension, hyperlipidemia, seizure disorder brought into the hospital by her husband because of about 8 episodes of seizure over an hour and was unresponsive and therefore EMS was called.  Clinical Impression  Pt admitted with above diagnosis. Pt currently with functional limitations due to the deficits listed below (see PT Problem List). Pt will benefit from skilled PT to increase their independence and safety with mobility to allow discharge to the venue listed below.  Due to aphasia, it was difficult to get clear picture of PLOF and home environment, but feel she is probably close to her baseline.  Will follow acutely, but do not feel she will need any PT at time of d/c.    Follow Up Recommendations No PT follow up;Supervision - Intermittent;Supervision for mobility/OOB    Equipment Recommendations  None recommended by PT    Recommendations for Other Services       Precautions / Restrictions Precautions Precautions: Fall Restrictions Weight Bearing Restrictions: No      Mobility  Bed Mobility Overal bed mobility: Independent                Transfers Overall transfer level: Needs assistance Equipment used: None Transfers: Sit to/from Stand Sit to Stand: Supervision         General transfer comment: S for safety, but no LOB or complaints of dizziness  Ambulation/Gait Ambulation/Gait assistance: Min guard;Supervision Ambulation Distance (Feet): 350 Feet Assistive device: None Gait Pattern/deviations: Decreased step length - right;Decreased step length - left     General Gait Details: Pt with decreased step length initially, but did respond to cues to lengthen.  Pt ambualted with min/guard to  occasional S.  Pt at times began to dance while ambulating causing decreased safety and need for min/guard.  Stairs            Wheelchair Mobility    Modified Rankin (Stroke Patients Only) Modified Rankin (Stroke Patients Only) Pre-Morbid Rankin Score: Moderate disability Modified Rankin: Moderate disability     Balance Overall balance assessment: History of Falls                                           Pertinent Vitals/Pain Pain Assessment: No/denies pain    Home Living Family/patient expects to be discharged to:: Private residence Living Arrangements: Spouse/significant other Available Help at Discharge: Family Type of Home: House           Additional Comments: Due to aphasia, pt was unable to clarify home living 100%.  It seems she lives with husband in 1 level home without steps.  She states she used to use a walker, but not now, so I assume she has one, but not sure.    Prior Function Level of Independence: Independent         Comments: per outpatient OT notes from late 2017, she was I with mobility     Hand Dominance        Extremity/Trunk Assessment   Upper Extremity Assessment Upper Extremity Assessment: Defer to OT evaluation    Lower Extremity Assessment Lower Extremity Assessment: Overall WFL for tasks assessed    Cervical / Trunk Assessment Cervical /  Trunk Assessment: Normal  Communication   Communication: Expressive difficulties  Cognition Arousal/Alertness: Awake/alert Behavior During Therapy: WFL for tasks assessed/performed Overall Cognitive Status: Within Functional Limits for tasks assessed Area of Impairment: Problem solving                         Safety/Judgement: Decreased awareness of safety   Problem Solving: Difficulty sequencing General Comments: impulsive at times      General Comments      Exercises     Assessment/Plan    PT Assessment Patient needs continued PT services   PT Problem List Decreased balance;Decreased mobility;Decreased safety awareness       PT Treatment Interventions DME instruction;Gait training;Functional mobility training;Balance training;Therapeutic exercise;Therapeutic activities;Patient/family education    PT Goals (Current goals can be found in the Care Plan section)  Acute Rehab PT Goals Patient Stated Goal: home PT Goal Formulation: With patient Time For Goal Achievement: 07/04/17 Potential to Achieve Goals: Good    Frequency Min 3X/week   Barriers to discharge        Co-evaluation               AM-PAC PT "6 Clicks" Daily Activity  Outcome Measure Difficulty turning over in bed (including adjusting bedclothes, sheets and blankets)?: None Difficulty moving from lying on back to sitting on the side of the bed? : None Difficulty sitting down on and standing up from a chair with arms (e.g., wheelchair, bedside commode, etc,.)?: A Little Help needed moving to and from a bed to chair (including a wheelchair)?: None Help needed walking in hospital room?: A Little Help needed climbing 3-5 steps with a railing? : A Little 6 Click Score: 21    End of Session Equipment Utilized During Treatment: Gait belt Activity Tolerance: Patient tolerated treatment well Patient left: in chair;with call bell/phone within reach;with chair alarm set Nurse Communication: Mobility status PT Visit Diagnosis: Difficulty in walking, not elsewhere classified (R26.2)    Time: [id number] PT Time Calculation (min) (ACUTE ONLY): 36 min   Charges:   PT Evaluation $PT Eval Low Complexity: 1 Low PT Treatments $Gait Training: 8-22 mins   PT G Codes:   PT G-Codes **NOT FOR INPATIENT CLASS** Functional Assessment Tool Used: AM-PAC 6 Clicks Basic Mobility Functional Limitation: Mobility: Walking and moving around Mobility: Walking and Moving Around Current Status ([id number](G8978): At least 20 percent but less than 40 percent impaired, limited or  restricted Mobility: Walking and Moving Around Goal Status [phone](G8979): At least 1 percent but less than 20 percent impaired, limited or restricted    Clydie BraunKaren L. Katrinka BlazingSmith, [place]PT Pager [phone] 06/20/2017   Angela MontgomeryKaren L Mardelle Pandolfi 06/20/2017, 9:56 AM

## 2017-06-20 NOTE — Evaluation (Signed)
Occupational Therapy Evaluation Patient Details Name: Angela Fields: [id number] DOB: 08-30-1951 Today's Date: 06/20/2017    History of Present Illness Angela Fields is a 66 y.o. female with medical history significant of a stroke with aphasia and right hemianopia, hypertension, hyperlipidemia, seizure disorder brought into the hospital by her husband because of about 8 episodes of seizure over an hour and was unresponsive and therefore EMS was called.   Clinical Impression   This 66 y/o F presents with the above. Unsure of Pt's PLOF, though in chart review Pt was previously mod independent with ADLs; Pt completed room level functional mobility with overall MinA-MinGuard throughout without AD, requires MinA for LB ADLs. Pt requires multimodal cues throughout session due to decreased safety awareness/problem solving, though suspect this may be close to Pt's baseline given her PMHx. Will continue to follow acutely to maximize Pt's overall safety and independence with ADLs and functional mobility prior to return home.     Follow Up Recommendations  No OT follow up;Supervision/Assistance - 24 hour    Equipment Recommendations  None recommended by OT;Other (comment) (may benefit from 3:1, unsure if Pt already owns one )           Precautions / Restrictions Precautions Precautions: Fall Restrictions Weight Bearing Restrictions: No      Mobility Bed Mobility Overal bed mobility: Modified Independent                Transfers Overall transfer level: Needs assistance Equipment used: None Transfers: Sit to/from Stand Sit to Stand: Min guard         General transfer comment: close guard for safety     Balance Overall balance assessment: History of Falls                                         ADL either performed or assessed with clinical judgement   ADL Overall ADL's : Needs assistance/impaired Eating/Feeding: Set up;Sitting   Grooming: Wash/dry  hands;Minimal assistance;Standing Grooming Details (indicate cue type and reason): min steady assist and assist to locate grooming items (soap, paper towels) Upper Body Bathing: Min guard;Sitting   Lower Body Bathing: Minimal assistance;Sit to/from stand   Upper Body Dressing : Minimal assistance;Sitting   Lower Body Dressing: Minimal assistance;Sit to/from stand Lower Body Dressing Details (indicate cue type and reason): Pt doffs/dons socks seated EOB  Toilet Transfer: Moderate assistance;Ambulation;BSC Toilet Transfer Details (indicate cue type and reason): BSC over toilet; increased assist as Pt attempting to sit prior to being positionted directly in front of BSC Toileting- Clothing Manipulation and Hygiene: Minimal assistance;Sit to/from stand       Functional mobility during ADLs: Minimal assistance;Min guard General ADL Comments: completes room level functional mobility with minGuard and intermittent MinA for safety during mobility     Vision Baseline Vision/History:  (baseline R hemianopia ) Additional Comments: Pt with baseline visual deficits, required multimodal cues during session, suspect partly due to visual deficits as well as cognitive deficits                 Pertinent Vitals/Pain Pain Assessment: No/denies pain          Extremity/Trunk Assessment Upper Extremity Assessment Upper Extremity Assessment: Generalized weakness   Lower Extremity Assessment Lower Extremity Assessment: Defer to PT evaluation   Cervical / Trunk Assessment Cervical / Trunk Assessment: Normal   Communication Communication Communication: Expressive difficulties  Cognition Arousal/Alertness: Awake/alert Behavior During Therapy: WFL for tasks assessed/performed Overall Cognitive Status: No family/caregiver present to determine baseline cognitive functioning Area of Impairment: Problem solving                         Safety/Judgement: Decreased awareness of safety    Problem Solving: Difficulty sequencing General Comments: impulsive at times; decreased safety awareness, requires multimodal cues during transfer completion                     Home Living Family/patient expects to be discharged to:: Private residence Living Arrangements: Spouse/significant other Available Help at Discharge: Family Type of Home: House                           Additional Comments: Due to aphasia and questionable cognitive deficits, pt was unable to clarify home living 100%.  It seems she lives with husband in 1 level home without steps.  She states she used to use a walker, but not now, so I assume she has one, but not sure.      Prior Functioning/Environment Level of Independence: Independent;Independent with assistive device(s)        Comments: per outpatient OT notes from late 2017, she was independent with mobility and mod independent with ADLs        OT Problem List: Decreased strength;Impaired balance (sitting and/or standing);Decreased cognition;Decreased safety awareness;Decreased activity tolerance;Decreased knowledge of use of DME or AE      OT Treatment/Interventions: Self-care/ADL training;DME and/or AE instruction;Therapeutic activities;Balance training;Therapeutic exercise;Energy conservation;Patient/family education    OT Goals(Current goals can be found in the care plan section) Acute Rehab OT Goals Patient Stated Goal: home OT Goal Formulation: With patient Time For Goal Achievement: 07/04/17 Potential to Achieve Goals: Good  OT Frequency: Min 2X/week                             AM-PAC PT "6 Clicks" Daily Activity     Outcome Measure Help from another person eating meals?: None Help from another person taking care of personal grooming?: A Little Help from another person toileting, which includes using toliet, bedpan, or urinal?: A Little Help from another person bathing (including washing, rinsing, drying)?: A  Little Help from another person to put on and taking off regular upper body clothing?: A Little Help from another person to put on and taking off regular lower body clothing?: A Lot 6 Click Score: 18   End of Session Equipment Utilized During Treatment: Gait belt Nurse Communication: Mobility status  Activity Tolerance: Patient tolerated treatment well Patient left: in bed;with call bell/phone within reach;with bed alarm set  OT Visit Diagnosis: Unsteadiness on feet (R26.81);Muscle weakness (generalized) (M62.81);Other symptoms and signs involving the nervous system (R29.898)                Time: 4010-2725 OT Time Calculation (min): 21 min Charges:  OT General Charges $OT Visit: 1 Visit OT Evaluation $OT Eval Low Complexity: 1 Low G-Codes: OT G-codes **NOT FOR INPATIENT CLASS** Functional Assessment Tool Used: AM-PAC 6 Clicks Daily Activity;Clinical judgement Functional Limitation: Self care Self Care Current Status (D6644): At least 20 percent but less than 40 percent impaired, limited or restricted Self Care Goal Status (I3474): At least 1 percent but less than 20 percent impaired, limited or restricted   Marcy Siren, [place] Pager [phone] 06/20/2017  Orlando Penner 06/20/2017, 1:18 PM

## 2017-06-20 NOTE — Progress Notes (Signed)
PROGRESS NOTE    Angela Fields  GMW:[id number] DOB: 1951/08/07 DOA: 06/19/2017 PCP: Angela DammeSmith, Karla, MD   Brief Narrative: 66 year old female with a history of stroke with right sided residual weakness admitted with seizure. Patient has a history of seizure she is on seizure medications at home. She apparently missed one dose of medication but she's had up to 8 seizures on the day of admission. She lives at home with her husband and husband takes care of medications. She was seen by neurology in the ER and adjusted and increased the dose of lacosamide 200 mg twice a day and to continue Lamictal at 400 mg daily along with the clonazepam 0.5 mg 3 times a day. this morning patient is awake with mild confusion. She was seen ambulating with physical therapy.    Assessment & Plan:   Active Problems:   Seizure (HCC)  Breakthrough seizure in a patient with history of chronic seizure and stroke plan would be to continue the increased dose of lacosamide 200 twice a day, Lamictal 400 daily, Klonopin 0.5 mg 3 times a day. Patient had a CT scan of the head in the ER did not reveal any acute changes. If patient remains seizure-free in 24 hours she can be discharged home. Discussed with the neurologist.  Hypokalemia with a potassium 3.1 repleted follow-up levels tomorrow.    DVT prophylaxis:  Code Status: Family Communication:  Disposition Plan:    Consultants:   Procedures:   Antimicrobials:    Subjective:   Objective: Vitals:   06/19/17 1400 06/19/17 1527 06/19/17 2111 06/20/17 0444  BP: 137/70 131/64 (!) 134/46 116/65  Pulse: (!) 57 (!) 57 (!) 56 (!) 53  Resp: 16 18 16 17   Temp:  97.9 F (36.6 C) 98.3 F (36.8 C) 97.9 F (36.6 C)  TempSrc:  Oral Oral Oral  SpO2: 98% 97% 100% 100%    Intake/Output Summary (Last 24 hours) at 06/20/17 1307 Last data filed at 06/20/17 1000  Gross per 24 hour  Intake           1202.5 ml  Output                0 ml  Net           1202.5 ml    There were no vitals filed for this visit.  Examination:  General exam: Appears calm and comfortable  Respiratory system: Clear to auscultation. Respiratory effort normal. Cardiovascular system: S1 & S2 heard, RRR. No JVD, murmurs, rubs, gallops or clicks. No pedal edema. Gastrointestinal system: Abdomen is nondistended, soft and nontender. No organomegaly or masses felt. Normal bowel sounds heard. Central nervous system: Alert and oriented. No focal neurological deficits. Extremities: Symmetric 5 x 5 power. Skin: No rashes, lesions or ulcers Psychiatry: Judgement and insight appear normal. Mood & affect appropriate.     Data Reviewed: I have personally reviewed following labs and imaging studies  CBC:  Recent Labs Lab 06/14/17 1905 06/19/17 0540  WBC 5.7 6.6  NEUTROABS 3.1 4.1  HGB 12.0 11.4*  HCT 36.4 34.8*  MCV 90.5 90.9  PLT 194 205   Basic Metabolic Panel:  Recent Labs Lab 06/14/17 1905 06/19/17 0540  NA 142 141  K 3.1* 3.1*  CL 102 102  CO2 32 32  GLUCOSE 81 97  BUN 14 16  CREATININE 1.08* 1.23*  CALCIUM 9.0 9.0  MG  --  2.4  PHOS  --  3.0   GFR: CrCl cannot be calculated (Unknown ideal weight.).  Liver Function Tests:  Recent Labs Lab 06/14/17 1905  AST 16  ALT 15  ALKPHOS 61  BILITOT 0.4  PROT 7.1  ALBUMIN 4.1   No results for input(s): LIPASE, AMYLASE in the last 168 hours. No results for input(s): AMMONIA in the last 168 hours. Coagulation Profile: No results for input(s): INR, PROTIME in the last 168 hours. Cardiac Enzymes:  Recent Labs Lab 06/14/17 2140  TROPONINI <0.03   BNP (last 3 results) No results for input(s): PROBNP in the last 8760 hours. HbA1C: No results for input(s): HGBA1C in the last 72 hours. CBG:  Recent Labs Lab 06/14/17 1804 06/14/17 2038 06/19/17 0539  GLUCAP 85 71 84   Lipid Profile: No results for input(s): CHOL, HDL, LDLCALC, TRIG, CHOLHDL, LDLDIRECT in the last 72 hours. Thyroid Function  Tests: No results for input(s): TSH, T4TOTAL, FREET4, T3FREE, THYROIDAB in the last 72 hours. Anemia Panel: No results for input(s): VITAMINB12, FOLATE, FERRITIN, TIBC, IRON, RETICCTPCT in the last 72 hours. Sepsis Labs: No results for input(s): PROCALCITON, LATICACIDVEN in the last 168 hours.  No results found for this or any previous visit (from the past 240 hour(s)).       Radiology Studies: No results found.      Scheduled Meds: . clonazePAM  0.5 mg Oral TID  . enoxaparin (LOVENOX) injection  40 mg Subcutaneous Q24H  . folic acid  1 mg Oral Daily  . irbesartan  150 mg Oral Daily  . lacosamide  200 mg Oral BID  . lamoTRIgine  200 mg Oral BID  . potassium chloride SA  20 mEq Oral Daily  . pravastatin  10 mg Oral Daily  . [START ON 06/22/2017] Vitamin D (Ergocalciferol)  50,000 Units Oral Q7 days   Continuous Infusions: . 0.9 % NaCl with KCl 20 mEq / L 50 mL/hr at 06/19/17 1051     LOS: 0 days     Angela Ren, MD [facility]  If 7PM-7AM, please contact night-coverage [email or web address] Password [email or web address] 06/20/2017, 1:07 PM

## 2017-06-20 NOTE — Plan of Care (Signed)
Problem: Safety: Goal: Ability to remain free from injury will improve Outcome: Progressing [facility]

## 2017-06-21 DIAGNOSIS — G40901 Epilepsy, unspecified, not intractable, with status epilepticus: Secondary | ICD-10-CM | POA: Diagnosis not present

## 2017-06-21 DIAGNOSIS — R569 Unspecified convulsions: Secondary | ICD-10-CM | POA: Diagnosis not present

## 2017-06-21 NOTE — Progress Notes (Signed)
PROGRESS NOTE    Angela Fields  ZOX:[id number] DOB: 1951/09/12 DOA: 06/19/2017 PCP: Caffie Damme, MD   Brief Narrative: 66 year old female with a history of stroke with right sided residual weakness admitted with seizure. Patient has a history of seizure she is on seizure medications at home. She apparently missed one dose of medication but she's had up to 8 seizures on the day of admission. She lives at home with her husband and husband takes care of medications. She was seen by neurology in the ER and adjusted and increased the dose of lacosamide 200 mg twice a day and to continue Lamictal at 400 mg daily along with the clonazepam 0.5 mg 3 times a day. this morning patient is awake with mild confusion. She was seen ambulating with physical therapy.    Assessment & Plan:   Active Problems:   Seizure (HCC) Seizures: Head CT negative Breakthrough seizure   increased dose of lacosamide 200 twice a day Discussed with the neurologist.   Hypokalemia: Repleted   Acute renal insufficiency: Improving Monitor renal function with electrolytes   DVT prophylaxis: Lovenox Code Status: Full code Family Communication:  Disposition Plan: If patient remains seizure-free in 24 hours she can be discharged home     Consultants:   Procedures:   Antimicrobials:    Subjective:   Objective: Vitals:   06/20/17 1321 06/20/17 1400 06/20/17 2028 06/21/17 0545  BP: (!) 145/69  (!) 152/76 (!) 154/65  Pulse: 66  95 96  Resp:   18 18  Temp: 98.2 F (36.8 C)  98.1 F (36.7 C) 98.7 F (37.1 C)  TempSrc: Oral  Oral Oral  SpO2: 99%  98% 97%  Weight:  73.1 kg (161 lb 3.2 oz)    Height:   (1.651 m)      Intake/Output Summary (Last 24 hours) at 06/21/17 1228 Last data filed at 06/20/17 1800  Gross per 24 hour  Intake              560 ml  Output                0 ml  Net              560 ml   Filed Weights   06/20/17 1400  Weight: 73.1 kg (161 lb 3.2 oz)     Examination:  General exam: No acute distress, comfortable  Respiratory system: Clear to auscultation. Respiratory effort normal. Cardiovascular system: S1 & S2 heard, RRR. No JVD, murmurs, rubs, gallops or clicks. No pedal edema. Gastrointestinal system: Abdomen is nondistended, soft and nontender. No organomegaly or masses felt. Normal bowel sounds heard. Central nervous system: Alert and oriented. No focal neurological deficits. Extremities: Symmetric 5 x 5 power. Skin: No rashes, lesions or ulcers Psychiatry:. Mood & affect appropriate.     Data Reviewed: I have personally reviewed following labs and imaging studies  CBC:  Recent Labs Lab 06/14/17 1905 06/19/17 0540  WBC 5.7 6.6  NEUTROABS 3.1 4.1  HGB 12.0 11.4*  HCT 36.4 34.8*  MCV 90.5 90.9  PLT 194 205   Basic Metabolic Panel:  Recent Labs Lab 06/14/17 1905 06/19/17 0540 06/20/17 1421  NA 142 141 141  K 3.1* 3.1* 4.0  CL 102 102 108  CO2 32 32 28  GLUCOSE 81 97 90  BUN CREATININE 1.08* 1.23* 1.06*  CALCIUM 9.0 9.0 8.8*  MG  --  2.4  --   PHOS  --  3.0  --  GFR: Estimated Creatinine Clearance: 50.8 mL/min (A) (by C-G formula based on SCr of 1.06 mg/dL (H)). Liver Function Tests:  Recent Labs Lab 06/14/17 1905  AST 16  ALT 15  ALKPHOS 61  BILITOT 0.4  PROT 7.1  ALBUMIN 4.1   No results for input(s): LIPASE, AMYLASE in the last 168 hours. No results for input(s): AMMONIA in the last 168 hours. Coagulation Profile: No results for input(s): INR, PROTIME in the last 168 hours. Cardiac Enzymes:  Recent Labs Lab 06/14/17 2140  TROPONINI <0.03   BNP (last 3 results) No results for input(s): PROBNP in the last 8760 hours. HbA1C: No results for input(s): HGBA1C in the last 72 hours. CBG:  Recent Labs Lab 06/14/17 1804 06/14/17 2038 06/19/17 0539  GLUCAP 85 71 84   Lipid Profile: No results for input(s): CHOL, HDL, LDLCALC, TRIG, CHOLHDL, LDLDIRECT in the last 72  hours. Thyroid Function Tests: No results for input(s): TSH, T4TOTAL, FREET4, T3FREE, THYROIDAB in the last 72 hours. Anemia Panel: No results for input(s): VITAMINB12, FOLATE, FERRITIN, TIBC, IRON, RETICCTPCT in the last 72 hours. Sepsis Labs: No results for input(s): PROCALCITON, LATICACIDVEN in the last 168 hours.  No results found for this or any previous visit (from the past 240 hour(s)).       Radiology Studies: No results found.      Scheduled Meds: . clonazePAM  0.5 mg Oral TID  . enoxaparin (LOVENOX) injection  40 mg Subcutaneous Q24H  . folic acid  1 mg Oral Daily  . irbesartan  150 mg Oral Daily  . lacosamide  200 mg Oral BID  . lamoTRIgine  200 mg Oral BID  . potassium chloride SA  20 mEq Oral Daily  . pravastatin  10 mg Oral Daily  . [START ON 06/22/2017] Vitamin D (Ergocalciferol)  50,000 Units Oral Q7 days   Continuous Infusions:    LOS: 0 days     OSEI-BONSU,Casha Estupinan, MD [phone] [facility]  If 7PM-7AM, please contact night-coverage [email or web address] Password [email or web address] 06/21/2017, 12:28 PM

## 2017-06-22 DIAGNOSIS — G40901 Epilepsy, unspecified, not intractable, with status epilepticus: Secondary | ICD-10-CM | POA: Diagnosis not present

## 2017-06-22 DIAGNOSIS — R569 Unspecified convulsions: Secondary | ICD-10-CM | POA: Diagnosis not present

## 2017-06-22 MED ORDER — LAMOTRIGINE 100 MG PO TABS: 200.0000 mg | ORAL_TABLET | Freq: Once | ORAL | Status: DC

## 2017-06-22 MED ORDER — LAMOTRIGINE 200 MG PO TABS: 200.0000 mg | ORAL_TABLET | Freq: Two times a day (BID) | ORAL | 0 refills | Status: DC

## 2017-06-22 MED ORDER — LACOSAMIDE 200 MG PO TABS: 200.0000 mg | ORAL_TABLET | Freq: Two times a day (BID) | ORAL | 0 refills | Status: DC

## 2017-06-22 NOTE — Discharge Summary (Signed)
Angela Fields, is a 66 y.o. female  DOB 07/10/1951  MRN [id number].  Admission date:  06/19/2017  Admitting Physician  Dron Jaynie Collins, MD  Discharge Date:  06/22/2017   Primary MD  Caffie Damme, MD  Recommendations for primary care physician for things to follow:  BMP to f/u on Creatinine   Admission Diagnosis  Status epilepticus (HCC) [G40.901] Seizure (HCC) [R56.9]   Discharge Diagnosis  Status epilepticus (HCC) [G40.901] Seizure (HCC) [R56.9]   Active Problems:   Seizure (HCC)     Seizures: Head CT negative Breakthrough seizure   increased dose of lacosamide 200 twice a day Discussed with the neurologist.   Hypokalemia: Repleted   Acute renal insufficiency: Improving Monitor renal function with electrolytes Past Medical History:  Diagnosis Date  . Diabetes mellitus without complication (HCC)   . Hyperlipidemia   . Hypertension   . Seizures (HCC)   . Stroke [facility])     History reviewed. No pertinent surgical history.     HPI  from the history and physical done on the day of admission:    Angela Fields is a 66 y.o. female with medical history significant of a stroke with aphasia and right hemianopia, hypertension, hyperlipidemia, seizure disorder brought into the hospital by her husband because of about 8 episodes of seizure over an hour last night and was unresponsive and therefore EMS was called. Patient was given Versed en route for facial twitching. Patient known to have seizure disorder and takes medication. As per medical record. She was compliant with her medications. Patient has Aphasia but able to answer some questions. Denied headache, dizziness, nausea, vomiting, chest pain or shortness of breath. She feels cold and wants extra blanket.  ED Course: In the ER CT scan of head with old left-sided infarct with no acute finding. Evaluated by neurologist who adjusted  seizure medication and recommended to observe patient today. I spoke with the ER physician. Labs with hypokalemia, serum creatinine level I.23 consistent with her baseline.   Hospital Course:  66 year old female with a history of stroke with right sided residual weakness admitted with seizure. Patient has a history of seizure she is on seizure medications at home. She apparently missed one dose of medication but she's had up to 8 seizures on the day of admission. She lives at home with her husband and husband takes care of medications. She was seen by neurology in the ER and adjusted and increased the dose of lacosamide 200 mg twice a day and to continue Lamictal at 400 mg daily along with the clonazepam 0.5 mg 3 times a day, and recommended 24 hour hospital observation. She has done well overall without any witnessed seizures and is being discharged home for out-patient follow-up/    Discharge Condition: stable and satisfactory  Follow UP - Neurology      Consults obtained - Neurology, Dr Petra Kuba  Diet and Activity recommendation:  As advised  Discharge Instructions    Discharge Instructions    Call MD for:  difficulty breathing, headache  or visual disturbances    Complete by:  As directed    Call MD for:  extreme fatigue    Complete by:  As directed    Call MD for:  hives    Complete by:  As directed    Call MD for:  persistant dizziness or light-headedness    Complete by:  As directed    Call MD for:  persistant nausea and vomiting    Complete by:  As directed    Call MD for:  redness, tenderness, or signs of infection (pain, swelling, redness, odor or green/yellow discharge around incision site)    Complete by:  As directed    Call MD for:  severe uncontrolled pain    Complete by:  As directed    Call MD for:  temperature >100.4    Complete by:  As directed    Diet - low sodium heart healthy    Complete by:  As directed    Increase activity slowly    Complete by:  As  directed         Discharge Medications     Allergies as of 06/22/2017   No Known Allergies     Medication List    STOP taking these medications   LAMICTAL XR 50 MG Tb24 Generic drug:  LamoTRIgine Replaced by:  lamoTRIgine 200 MG tablet   LamoTRIgine 300 MG Tb24     TAKE these medications   clonazePAM 1 MG tablet Commonly known as:  KLONOPIN Take 0.5 mg by mouth 3 (three) times daily.   folic acid 1 MG tablet Commonly known as:  FOLVITE Take 1 mg by mouth daily.   lacosamide 200 MG Tabs tablet Commonly known as:  VIMPAT Take 1 tablet (200 mg total) by mouth 2 (two) times daily. What changed:  medication strength  how much to take   lamoTRIgine 200 MG tablet Commonly known as:  LAMICTAL Take 1 tablet (200 mg total) by mouth 2 (two) times daily. Replaces:  LAMICTAL XR 50 MG Tb24   metoprolol tartrate 25 MG tablet Commonly known as:  LOPRESSOR Take 25 mg by mouth 2 (two) times daily.   potassium chloride 10 MEQ tablet Commonly known as:  K-DUR Take 10 mEq by mouth daily.   pravastatin 10 MG tablet Commonly known as:  PRAVACHOL Take 10 mg by mouth daily.   valsartan-hydrochlorothiazide 320-25 MG tablet Commonly known as:  DIOVAN-HCT Take 1 tablet by mouth daily.   Vitamin D (Ergocalciferol) 50000 units Caps capsule Commonly known as:  DRISDOL Take 50,000 Units by mouth every 7 (seven) days. On /09/18 1116   06/22/17 0000  Call MD for:  redness,  tenderness, or signs of infection (pain, swelling, redness, odor  or green/yellow discharge around incision site)     06/22/17 1116   06/22/17 0000  Call MD for:  difficulty breathing, headache or visual disturbances     06/22/17 1116   06/22/17 0000  Call MD for:  hives     06/22/17 1116   06/22/17 0000  Call MD for:  persistant dizziness or light-headedness     06/22/17 1116   06/22/17 0000  Call MD for:  extreme fatigue     06/22/17 1116   06/22/17 0000  lamoTRIgine (LAMICTAL) 200 MG tablet  2 times daily     06/22/17 1125      Major procedures and Radiology Reports   Ct Head Wo Contrast  Result Date: 06/14/2017 CLINICAL DATA:  Altered mental status EXAM: CT HEAD WITHOUT CONTRAST TECHNIQUE: Contiguous axial images were obtained from the base of the skull through the vertex without intravenous contrast. COMPARISON:  09/24/2016, FINDINGS: Brain: No acute territorial infarction, hemorrhage or mass is seen. Large old area of encephalomalacia within the left parietal and occipital lobes with ex vacuo dilatation of the left ventricle. Moderate small vessel ischemic changes of the white matter. No midline shift. Vascular: Metallic artifact near the left cavernous carotid artery. No hyperdense vessels Skull: No fracture Sinuses/Orbits: No acute finding. Other: None IMPRESSION: 1. No CT evidence for acute intracranial abnormality. 2. Similar appearance of old left-sided infarct. 3. Moderate to marked small vessel ischemic changes of the white matter. Electronically Signed   By: Jasmine Pang M.D.   On: 06/14/2017 20:26    Micro Results     No results found for this or any previous visit (from the past 240 hour(s)).     Today   Subjective   Aylla Huffine today has no headaches, fever or chills          Patient has been seen and examined prior to discharge   Objective   Blood pressure 124/87, pulse 80, temperature 98 F (36.7 C), temperature source Oral, resp. rate 18, height   (1.651 m), weight 73.1 kg (161 lb 3.2 oz), SpO2 100 %.   Intake/Output Summary (Last 24 hours) at 06/22/17 1125 Last data filed at 06/21/17 1400  Gross per 24 hour  Intake              120 ml  Output                0 ml  Net              120 ml    Exam  General exam: No acute distress, comfortable  Respiratory system: Clear to auscultation. Respiratory effort normal. Cardiovascular system: S1 & S2 heard, RRR. No JVD, murmurs, rubs, gallops or clicks. No pedal edema. Gastrointestinal system: Abdomen is nondistended, soft and nontender. No organomegaly or masses felt. Normal bowel sounds heard. Central nervous system: Alert and oriented. No focal neurological deficits. Extremities: Symmetric 5 x 5 power. Skin: No rashes, lesions or ulcers Psychiatry:. Mood & affect appropriate.    Data Review   CBC w Diff:  Lab Results  Component Value Date   WBC 6.6 06/19/2017   HGB 11.4 (L) 06/19/2017   HCT 34.8 (L) 06/19/2017   PLT 205 06/19/2017   LYMPHOPCT 30 06/19/2017   MONOPCT 6 06/19/2017   EOSPCT 2 06/19/2017   BASOPCT 1 06/19/2017    CMP:  Lab Results  Component Value Date   NA 141 06/20/2017   K 4.0 06/20/2017   CL 108 06/20/2017  CO2 28 06/20/2017   BUN 9 06/20/2017   CREATININE 1.06 (H) 06/20/2017   PROT 7.1 06/14/2017   ALBUMIN 4.1 06/14/2017   BILITOT 0.4 06/14/2017   ALKPHOS 61 06/14/2017   AST 16 06/14/2017   ALT 15 06/14/2017  .   Total Discharge time is about 33 minutes  OSEI-BONSU,Shaletha Humble M.D on 06/22/2017 at 11:25 AM  [facility]   Office  [phone]  Dragon dictation system was used to create this note, attempts have been made to correct errors, however presence of uncorrected errors is not a reflection quality of care provided

## 2017-06-22 NOTE — Progress Notes (Signed)
Patient has been seizure free for 48 hours and ambulating in room independently.  She does not have and IV insertion or Foley.  She has been cleared and awaiting discharge.  Her husband has been informed of discharge clearance and will pick patient up to go home around 5 pm.  Continue with plan of care.  Patient reported off.

## 2017-06-22 NOTE — Progress Notes (Addendum)
Assumed care of patient. Pt for d/c home awaiting arrival of husband. Throughout the course of my shift patient became increasingly confused. Will  Update me via page. Spoke MD. States that this is  patients baseline.Marland Kitchen. Spoke with husband as well. He verified that this patients baseline

## 2017-06-25 ENCOUNTER — Encounter (HOSPITAL_COMMUNITY): Payer: Self-pay | Admitting: Emergency Medicine

## 2017-06-25 ENCOUNTER — Emergency Department (HOSPITAL_COMMUNITY)
Admission: EM | Admit: 2017-06-25 | Discharge: 2017-06-30 | Disposition: A | Discharge status: Other Acute Inpatient Hospital | Payer: Medicare HMO | Attending: Emergency Medicine | Admitting: Emergency Medicine

## 2017-06-25 ENCOUNTER — Emergency Department (HOSPITAL_COMMUNITY): Payer: Medicare HMO

## 2017-06-25 DIAGNOSIS — R4689 Other symptoms and signs involving appearance and behavior: Secondary | ICD-10-CM | POA: Diagnosis not present

## 2017-06-25 DIAGNOSIS — I10 Essential (primary) hypertension: Secondary | ICD-10-CM | POA: Insufficient documentation

## 2017-06-25 DIAGNOSIS — E119 Type 2 diabetes mellitus without complications: Secondary | ICD-10-CM | POA: Insufficient documentation

## 2017-06-25 DIAGNOSIS — R413 Other amnesia: Secondary | ICD-10-CM

## 2017-06-25 DIAGNOSIS — R4189 Other symptoms and signs involving cognitive functions and awareness: Secondary | ICD-10-CM | POA: Diagnosis present

## 2017-06-25 DIAGNOSIS — Z8673 Personal history of transient ischemic attack (TIA), and cerebral infarction without residual deficits: Secondary | ICD-10-CM | POA: Insufficient documentation

## 2017-06-25 DIAGNOSIS — R4182 Altered mental status, unspecified: Secondary | ICD-10-CM | POA: Insufficient documentation

## 2017-06-25 DIAGNOSIS — G40901 Epilepsy, unspecified, not intractable, with status epilepticus: Secondary | ICD-10-CM | POA: Diagnosis not present

## 2017-06-25 DIAGNOSIS — Z8669 Personal history of other diseases of the nervous system and sense organs: Secondary | ICD-10-CM | POA: Diagnosis not present

## 2017-06-25 DIAGNOSIS — R569 Unspecified convulsions: Secondary | ICD-10-CM | POA: Diagnosis not present

## 2017-06-25 DIAGNOSIS — R001 Bradycardia, unspecified: Secondary | ICD-10-CM | POA: Diagnosis not present

## 2017-06-25 DIAGNOSIS — Z781 Physical restraint status: Secondary | ICD-10-CM | POA: Diagnosis not present

## 2017-06-25 LAB — URINALYSIS, ROUTINE W REFLEX MICROSCOPIC
BACTERIA UA: NONE SEEN
BILIRUBIN URINE: NEGATIVE
Glucose, UA: NEGATIVE mg/dL
Ketones, ur: 5 mg/dL — AB
NITRITE: NEGATIVE
Protein, ur: NEGATIVE mg/dL
SPECIFIC GRAVITY, URINE: 1.008 (ref 1.005–1.030)
pH: 7 (ref 5.0–8.0)

## 2017-06-25 LAB — CBC
HCT: 37.1 % (ref 36.0–46.0)
HEMOGLOBIN: 12.3 g/dL (ref 12.0–15.0)
MCH: 29.6 pg (ref 26.0–34.0)
MCHC: 33.2 g/dL (ref 30.0–36.0)
MCV: 89.4 fL (ref 78.0–100.0)
Platelets: 170 10*3/uL (ref 150–400)
RBC: 4.15 MIL/uL (ref 3.87–5.11)
RDW: 13 % (ref 11.5–15.5)
WBC: 7 10*3/uL (ref 4.0–10.5)

## 2017-06-25 LAB — COMPREHENSIVE METABOLIC PANEL
ALBUMIN: 4.4 g/dL (ref 3.5–5.0)
ALT: 12 U/L — ABNORMAL LOW (ref 14–54)
AST: 17 U/L (ref 15–41)
Alkaline Phosphatase: 73 U/L (ref 38–126)
Anion gap: 8 (ref 5–15)
BILIRUBIN TOTAL: 0.5 mg/dL (ref 0.3–1.2)
BUN: 14 mg/dL (ref 6–20)
CALCIUM: 9.3 mg/dL (ref 8.9–10.3)
CO2: 30 mmol/L (ref 22–32)
CREATININE: 1.06 mg/dL — AB (ref 0.44–1.00)
Chloride: 101 mmol/L (ref 101–111)
GFR calc Af Amer: >60 mL/min (ref >60)
GFR calc non Af Amer: 53 mL/min — ABNORMAL LOW (ref >60)
GLUCOSE: 108 mg/dL — AB (ref 65–99)
POTASSIUM: 3.5 mmol/L (ref 3.5–5.1)
SODIUM: 139 mmol/L (ref 135–145)
Total Protein: 7.7 g/dL (ref 6.5–8.1)

## 2017-06-25 LAB — ACETAMINOPHEN LEVEL: Acetaminophen (Tylenol), Serum: <10 ug/mL — ABNORMAL LOW (ref 10–30)

## 2017-06-25 LAB — SALICYLATE LEVEL: Salicylate Lvl: <7 mg/dL (ref 2.8–30.0)

## 2017-06-25 LAB — ETHANOL: Alcohol, Ethyl (B): <5 mg/dL (ref <5)

## 2017-06-25 MED ORDER — ZOLPIDEM TARTRATE 5 MG PO TABS: 5.0000 mg | ORAL_TABLET | Freq: Every evening | ORAL | Status: DC | PRN

## 2017-06-25 MED ORDER — DOCUSATE SODIUM 100 MG PO CAPS
100.0000 mg | ORAL_CAPSULE | Freq: Two times a day (BID) | ORAL | Status: DC
  Administered 2017-06-26 – 2017-06-30 (×6): 100 mg via ORAL
  Filled 2017-06-25 (×8): qty 1

## 2017-06-25 MED ORDER — METOPROLOL TARTRATE 25 MG PO TABS
25.0000 mg | ORAL_TABLET | Freq: Two times a day (BID) | ORAL | Status: DC
  Administered 2017-06-26 – 2017-06-30 (×7): 25 mg via ORAL
  Filled 2017-06-25 (×8): qty 1

## 2017-06-25 MED ORDER — LORAZEPAM 2 MG/ML IJ SOLN
2.0000 mg | Freq: Once | INTRAMUSCULAR | Status: AC
  Administered 2017-06-25: 2 mg via INTRAMUSCULAR
  Filled 2017-06-25: qty 1

## 2017-06-25 MED ORDER — IRBESARTAN 300 MG PO TABS
300.0000 mg | ORAL_TABLET | Freq: Every day | ORAL | Status: DC
  Administered 2017-06-28 – 2017-06-30 (×3): 300 mg via ORAL
  Filled 2017-06-25 (×5): qty 1

## 2017-06-25 MED ORDER — LAMOTRIGINE 100 MG PO TABS: 200.0000 mg | ORAL_TABLET | Freq: Two times a day (BID) | ORAL | Status: DC

## 2017-06-25 MED ORDER — ZIPRASIDONE MESYLATE 20 MG IM SOLR
20.0000 mg | Freq: Once | INTRAMUSCULAR | Status: AC
  Administered 2017-06-25: 20 mg via INTRAMUSCULAR
  Filled 2017-06-25: qty 20

## 2017-06-25 MED ORDER — LORAZEPAM 2 MG/ML IJ SOLN
1.0000 mg | Freq: Once | INTRAMUSCULAR | Status: AC
  Administered 2017-06-25: 1 mg via INTRAMUSCULAR
  Filled 2017-06-25: qty 1

## 2017-06-25 MED ORDER — POTASSIUM CHLORIDE CRYS ER 10 MEQ PO TBCR
10.0000 meq | EXTENDED_RELEASE_TABLET | Freq: Every day | ORAL | Status: DC
  Administered 2017-06-26 – 2017-06-30 (×4): 10 meq via ORAL
  Filled 2017-06-25 (×5): qty 1

## 2017-06-25 MED ORDER — STERILE WATER FOR INJECTION IJ SOLN
INTRAMUSCULAR | Status: AC
  Administered 2017-06-25: 1.2 mL
  Filled 2017-06-25: qty 10

## 2017-06-25 MED ORDER — ZIPRASIDONE MESYLATE 20 MG IM SOLR
10.0000 mg | Freq: Once | INTRAMUSCULAR | Status: AC
  Administered 2017-06-25: 10 mg via INTRAMUSCULAR
  Filled 2017-06-25: qty 20

## 2017-06-25 MED ORDER — LACOSAMIDE 50 MG PO TABS
200.0000 mg | ORAL_TABLET | Freq: Two times a day (BID) | ORAL | Status: DC
  Administered 2017-06-26 – 2017-06-30 (×7): 200 mg via ORAL
  Filled 2017-06-25 (×8): qty 4

## 2017-06-25 MED ORDER — ACETAMINOPHEN 325 MG PO TABS: 650.0000 mg | ORAL_TABLET | ORAL | Status: DC | PRN

## 2017-06-25 MED ORDER — LAMOTRIGINE 150 MG PO TABS
150.0000 mg | ORAL_TABLET | Freq: Two times a day (BID) | ORAL | Status: DC
  Administered 2017-06-26 – 2017-06-30 (×6): 150 mg via ORAL
  Filled 2017-06-25 (×10): qty 1

## 2017-06-25 MED ORDER — ONDANSETRON HCL 4 MG PO TABS
4.0000 mg | ORAL_TABLET | Freq: Three times a day (TID) | ORAL | Status: DC | PRN
  Administered 2017-06-29: 4 mg via ORAL
  Filled 2017-06-25: qty 1

## 2017-06-25 MED ORDER — FOLIC ACID 1 MG PO TABS
1.0000 mg | ORAL_TABLET | Freq: Every day | ORAL | Status: DC
  Administered 2017-06-26 – 2017-06-30 (×4): 1 mg via ORAL
  Filled 2017-06-25 (×5): qty 1

## 2017-06-25 MED ORDER — HYDROCHLOROTHIAZIDE 25 MG PO TABS
25.0000 mg | ORAL_TABLET | Freq: Every day | ORAL | Status: DC
Start: 2017-06-26 — End: 2017-06-30
  Administered 2017-06-28 – 2017-06-30 (×3): 25 mg via ORAL
  Filled 2017-06-25 (×5): qty 1

## 2017-06-25 MED ORDER — ALUM & MAG HYDROXIDE-SIMETH 200-200-20 MG/5ML PO SUSP: 30.0000 mL | Freq: Four times a day (QID) | ORAL | Status: DC | PRN

## 2017-06-25 MED ORDER — VALSARTAN-HYDROCHLOROTHIAZIDE 320-25 MG PO TABS: 1.0000 | ORAL_TABLET | Freq: Every day | ORAL | Status: DC

## 2017-06-25 MED ORDER — STERILE WATER FOR INJECTION IJ SOLN
INTRAMUSCULAR | Status: AC
  Administered 2017-06-25: 10 mL
  Filled 2017-06-25: qty 10

## 2017-06-25 MED ORDER — CLONAZEPAM 0.5 MG PO TABS: 0.5000 mg | ORAL_TABLET | Freq: Three times a day (TID) | ORAL | Status: DC

## 2017-06-25 MED ORDER — PRAVASTATIN SODIUM 10 MG PO TABS
10.0000 mg | ORAL_TABLET | Freq: Every day | ORAL | Status: DC
  Administered 2017-06-26 – 2017-06-29 (×3): 10 mg via ORAL
  Filled 2017-06-25 (×5): qty 1

## 2017-06-25 MED ORDER — LORAZEPAM 1 MG PO TABS: 1.0000 mg | ORAL_TABLET | ORAL | Status: DC | PRN

## 2017-06-25 NOTE — ED Notes (Signed)
Pt had drawn for labs:  Gold Blue Lavender Lt green Dark green x2 

## 2017-06-25 NOTE — ED Notes (Signed)
Bed: WA08 Expected date:  Expected time:  Means of arrival:  Comments: 

## 2017-06-25 NOTE — ED Provider Notes (Signed)
Pt signed out from Dr. Clarene DukeLittle pending TTS eval.  According to the nurse they were unable to evaluate her b/c pt would not participate in the process.  They have been trying to get some more history from the husband.  Unfortunately, pt's husband has not come here and has not been able to give any further history.  I will put pt in psych hold for a re-eval in the morning by psych.   Angela LefevreHaviland, Angela Scholten, MD 06/25/17 [phone]

## 2017-06-25 NOTE — ED Notes (Signed)
Patient requesting to call husband. Assisted patient to call husband.
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
## 2017-06-25 NOTE — BH Assessment (Addendum)
Assessment Note  Angela Fields is an 66 y.o. female no documented mental health history. Writer attempted to assess patient but she refused. She remain in the hospital bed with covers over her head. She was mumbling and babbling. Her statements are non sensible.   She stated, "I ready to stay here all night if needed" and "I don't need to talk". Writer was unable to obtain any further information from patient. SI, HI, AVH were neither confirmed or denied. Per ED notes, "Patient with history of seizures, memory problems who p/w behavioral issues. EMS states husband called them stating she has had behavioral issues recently, what he describes as similar to a manic phase lasting ~1 hour. He reported that they have become more frequent recently and longer in duration. When EMS picked up patient, she was laying on the floor, singing and clapping."    Diagnosis: Rule out Dementia due to Stroke with Behavioral Disturbance  Past Medical History:  Past Medical History:  Diagnosis Date  . Diabetes mellitus without complication (HCC)   . Hyperlipidemia   . Hypertension   . Seizures (HCC)   . Stroke [facility])     History reviewed. No pertinent surgical history.  Family History: No family history on file.  Social History:  reports that she has never smoked. She has never used smokeless tobacco. She reports that she does not drink alcohol or use drugs.  Additional Social History:  Alcohol / Drug Use Pain Medications: SEE MAR Prescriptions: SEE MAR Over the Counter: SEE MAR History of alcohol / drug use?: No history of alcohol / drug abuse  CIWA: CIWA-Ar BP: (!) 124/93 Pulse Rate: 65 COWS:    Allergies: No Known Allergies  Home Medications:  (Not in a hospital admission)  OB/GYN Status:  No LMP recorded. Patient is postmenopausal.  General Assessment Data TTS Assessment: In system Is this a Tele or Face-to-Face Assessment?: Face-to-Face Is this an Initial Assessment or a Re-assessment for  this encounter?: Initial Assessment Marital status: Married Angela Fields name:  (n/a) Is patient pregnant?: No Pregnancy Status: No Living Arrangements: Spouse/significant other Can pt return to current living arrangement?: No Admission Status: Voluntary Is patient capable of signing voluntary admission?: Yes Referral Source: Self/Family/Friend Insurance type:  Administrator and [employer])     Crisis Care Plan Living Arrangements: Spouse/significant other Legal Guardian: Other: (no legal guardian ) Name of Psychiatrist:  (no psychiatrist) Name of Therapist:  (no therapist )  Education Status Is patient currently in school?: No Current Grade:  (n/a) Highest grade of school patient has completed:  (n/a) Name of school:  (n/a) Contact person:  (n/a)  Risk to self with the past 6 months Suicidal Ideation:  (UTA) Has patient been a risk to self within the past 6 months prior to admission? :  (UTA) Suicidal Intent:  (UTA) Has patient had any suicidal intent within the past 6 months prior to admission? :  (UTA) Is patient at risk for suicide?:  (unk ) Suicidal Plan?:  (UTA) Has patient had any suicidal plan within the past 6 months prior to admission? :  (UTA) Access to Means:  (UTA) What has been your use of drugs/alcohol within the last 12 months?:  (unk) Previous Attempts/Gestures:  (UTA) How many times?:  (unk) Other Self Harm Risks:  (unk) Triggers for Past Attempts: Other (Comment) (unk) Intentional Self Injurious Behavior:  (unk) Family Suicide History: Unknown Recent stressful life event(s): Other (Comment) (unk) Persecutory voices/beliefs?:  (unk) Depression:  (unk) Depression Symptoms:  (unk) Substance  abuse history and/or treatment for substance abuse?:  (unk) Suicide prevention information given to non-admitted patients:  (unk)  Risk to Others within the past 6 months Homicidal Ideation: No Does patient have any lifetime risk of violence toward others beyond the six months  prior to admission? : Unknown Thoughts of Harm to Others:  (unk) Current Homicidal Intent:  (unk) Current Homicidal Plan: No Access to Homicidal Means:  (unk) Identified Victim:  (unk) History of harm to others?:  (unk) Assessment of Violence:  (unk) Violent Behavior Description:  (unk; patient was uncooperative w/ completing the assessment ) Does patient have access to weapons?:  (unk) Criminal Charges Pending?:  (unk) Does patient have a court date:  (unk) Is patient on probation?: Unknown  Psychosis Hallucinations: None noted (unknown ) Delusions: None noted, Unspecified (unknown )  Mental Status Report Appearance/Hygiene: Unable to Assess Eye Contact: Unable to Assess Motor Activity: Unable to assess Speech: Unable to assess Level of Consciousness: Unable to assess Mood: Other (Comment) (UTA) Affect: Unable to Assess (UTA) Anxiety Level: Minimal Thought Processes: Unable to Assess Judgement: Unable to Assess Orientation: Person, Place, Time, Situation Obsessive Compulsive Thoughts/Behaviors: Unable to Assess  Cognitive Functioning Concentration: Unable to Assess Memory: Unable to Assess IQ: Average Insight: Unable to Assess Impulse Control: Unable to Assess Appetite:  (UTA) Weight Loss:  (unk) Weight Gain:  (unk) Sleep: Unable to Assess Total Hours of Sleep:  (unk)  ADLScreening [facility] Assessment Services) Patient's cognitive ability adequate to safely complete daily activities?: Yes Patient able to express need for assistance with ADLs?: No Independently performs ADLs?: Yes (appropriate for developmental age)  Prior Inpatient Therapy Prior Inpatient Therapy:  (unk) Prior Therapy Dates:  (unk) Prior Therapy Facilty/Provider(s):  (unk) Reason for Treatment:  (unk)  Prior Outpatient Therapy Prior Outpatient Therapy:  (unk) Prior Therapy Dates:  (unk) Prior Therapy Facilty/Provider(s):  (unk) Reason for Treatment:  (unk) Does patient have an ACCT team?:  Unknown Does patient have Intensive In-House Services?  : Unknown Does patient have [facility] services? : Unknown Does patient have P4CC services?: Unknown  ADL Screening (condition at time of admission) Patient's cognitive ability adequate to safely complete daily activities?: Yes Is the patient deaf or have difficulty hearing?: No Does the patient have difficulty seeing, even when wearing glasses/contacts?: Yes Does the patient have difficulty concentrating, remembering, or making decisions?: Yes Patient able to express need for assistance with ADLs?: No Does the patient have difficulty dressing or bathing?: Yes Independently performs ADLs?: Yes (appropriate for developmental age) Communication: Independent Dressing (OT): Needs assistance Is this a change from baseline?: Change from baseline, expected to last >3 days Grooming: Needs assistance Is this a change from baseline?: Change from baseline, expected to last >3 days Feeding: Needs assistance Is this a change from baseline?: Change from baseline, expected to last >3 days Bathing: Needs assistance Toileting: Needs assistance Is this a change from baseline?: Change from baseline, expected to last >3days In/Out Bed: Needs assistance Is this a change from baseline?: Change from baseline, expected to last >3 days Walks in Home: Needs assistance Is this a change from baseline?: Change from baseline, expected to last >3 days Does the patient have difficulty walking or climbing stairs?: Yes Weakness of Legs: Both Weakness of Arms/Hands: Both  Home Assistive Devices/Equipment Home Assistive Devices/Equipment: Eyeglasses    Abuse/Neglect Assessment (Assessment to be complete while patient is alone) Physical Abuse:  (unk) Verbal Abuse:  (unk) Sexual Abuse:  (unk) Exploitation of patient/patient's resources:  (unk) Self-Neglect:  (unk)  Values / Beliefs Cultural Requests During Hospitalization: None Spiritual Requests During  Hospitalization: None   Advance Directives (For Healthcare) Does Patient Have a Medical Advance Directive?: No Would patient like information on creating a medical advance directive?:  (UTA) Nutrition Screen- [facility] Adult/WL/AP Patient's home diet: Regular  Additional Information 1:1 In Past 12 Months?: No CIRT Risk:  (unk) Elopement Risk:  (unk) Does patient have medical clearance?:  (unk)     Disposition:  Disposition Initial Assessment Completed for this Encounter: Yes Disposition of Patient:  (Per Elta GuadeloupeLaurie Parks, NP, pending am psych am)  On Site Evaluation by:   Reviewed with Physician:    Melynda Rippleoyka Olia Hinderliter 06/25/2017 5:46 PM

## 2017-06-25 NOTE — ED Triage Notes (Signed)
Per [facility] pt from home called out by pt husband stating since her recent stroke she has been having behavioral issues. Husband states it is similar to a manic phase usually lasting 1 hour. Now they are becoming more frequent and longer in duration. When EMS arrived pt was laying on floor singing and clapping. Pt denies any complaints.

## 2017-06-25 NOTE — ED Notes (Signed)
Bed: [facility]ALB Expected date:  Expected time:  Means of arrival:  Comments: Room 8 

## 2017-06-25 NOTE — ED Notes (Signed)
Pt has been given grape juice.

## 2017-06-25 NOTE — ED Notes (Signed)
This RN was notified by secretary to check patient oxygen saturation. When walking into room patient was found sliding out at the end of the bed. This RN and 2 other RNs helped lower the patient to the ground. MD notified. Safety sitter ordered and fall alarm placed until safety sitter arrives.

## 2017-06-25 NOTE — ED Notes (Signed)
Patient transported to CT 

## 2017-06-26 DIAGNOSIS — R4189 Other symptoms and signs involving cognitive functions and awareness: Secondary | ICD-10-CM | POA: Diagnosis present

## 2017-06-26 DIAGNOSIS — R4689 Other symptoms and signs involving appearance and behavior: Secondary | ICD-10-CM | POA: Diagnosis present

## 2017-06-26 DIAGNOSIS — R4587 Impulsiveness: Secondary | ICD-10-CM

## 2017-06-26 DIAGNOSIS — G40901 Epilepsy, unspecified, not intractable, with status epilepticus: Secondary | ICD-10-CM | POA: Diagnosis not present

## 2017-06-26 DIAGNOSIS — R001 Bradycardia, unspecified: Secondary | ICD-10-CM | POA: Diagnosis not present

## 2017-06-26 DIAGNOSIS — R413 Other amnesia: Secondary | ICD-10-CM | POA: Diagnosis not present

## 2017-06-26 MED ORDER — LORAZEPAM 2 MG/ML IJ SOLN
2.0000 mg | Freq: Once | INTRAMUSCULAR | Status: AC
  Administered 2017-06-26: 2 mg via INTRAMUSCULAR
  Filled 2017-06-26: qty 1

## 2017-06-26 MED ORDER — DIPHENHYDRAMINE HCL 50 MG/ML IJ SOLN
25.0000 mg | Freq: Once | INTRAMUSCULAR | Status: AC
  Administered 2017-06-26: 25 mg via INTRAMUSCULAR
  Filled 2017-06-26: qty 1

## 2017-06-26 MED ORDER — DIPHENHYDRAMINE HCL 50 MG/ML IJ SOLN: 25.0000 mg | Freq: Once | INTRAMUSCULAR | Status: DC

## 2017-06-26 MED ORDER — DIPHENHYDRAMINE HCL 50 MG/ML IJ SOLN
50.0000 mg | Freq: Once | INTRAMUSCULAR | Status: AC
  Administered 2017-06-26: 50 mg via INTRAMUSCULAR
  Filled 2017-06-26: qty 1

## 2017-06-26 MED ORDER — LORAZEPAM 2 MG/ML IJ SOLN: 1.0000 mg | Freq: Once | INTRAMUSCULAR | Status: DC

## 2017-06-26 MED ORDER — TRAZODONE HCL 50 MG PO TABS
50.0000 mg | ORAL_TABLET | Freq: Every day | ORAL | Status: DC
  Administered 2017-06-26 – 2017-06-29 (×3): 50 mg via ORAL
  Filled 2017-06-26 (×3): qty 1

## 2017-06-26 MED ORDER — OLANZAPINE 10 MG IM SOLR
10.0000 mg | Freq: Once | INTRAMUSCULAR | Status: AC
  Administered 2017-06-26: 10 mg via INTRAMUSCULAR
  Filled 2017-06-26: qty 10

## 2017-06-26 MED ORDER — HALOPERIDOL LACTATE 5 MG/ML IJ SOLN
5.0000 mg | Freq: Once | INTRAMUSCULAR | Status: AC
  Administered 2017-06-26: 5 mg via INTRAMUSCULAR
  Filled 2017-06-26: qty 1

## 2017-06-26 MED ORDER — HALOPERIDOL LACTATE 5 MG/ML IJ SOLN: 5.0000 mg | Freq: Once | INTRAMUSCULAR | Status: DC

## 2017-06-26 MED ORDER — TRAZODONE HCL 50 MG PO TABS: 50.0000 mg | ORAL_TABLET | Freq: Every day | ORAL | 0 refills | Status: DC
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
## 2017-06-26 NOTE — ED Notes (Signed)
Restraints removed from patient.

## 2017-06-26 NOTE — Consult Note (Signed)
[place] Psychiatry Consult   Reason for Consult:  Post-stroke behavior changes Referring Physician:  EDP Patient Identification: Angela Fields MRN:  [id number] Principal Diagnosis: Cognitive and behavioral changes Diagnosis:   Patient Active Problem List   Diagnosis Date Noted  . Cognitive and behavioral changes [R41.89, R46.89] 06/26/2017    Priority: High  . Seizure ([place]) [R56.9] 06/19/2017  . Status epilepticus ([place]) [G40.901]   . Bradycardia [R00.1]   . Hypokalemia [E87.6]   . Hypertension [I10] 09/24/2016  . History of CVA (cerebrovascular accident) [Z86.73] 09/24/2016  . 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 Hyperlipidemia [E78.5] 09/24/2016    Total Time spent with patient: 45 minutes  Subjective:   Angela Fields is a 66 y.o. female patient admitted with aggression.  HPI:  66 yo female who recently had a major stroke with cognitive and behavior changes.  Upon assessment this morning, she was smiling and everything was "wonderful."  The plan was to discharge her but the husband could not come until the afternoon.  When she was transferred to TCU she did not want to walk and became combative, requiring PRN agitation medications.  Based on her behaviors at this time, she was kept.  Past Psychiatric History: none prior to stroke  Risk to Self: Suicidal Ideation:  (UTA) Suicidal Intent:  (UTA) Is patient at risk for suicide?:  (unk ) Suicidal Plan?:  (UTA) Access to Means:  (UTA) What has been your use of drugs/alcohol within the last 12 months?:  (unk) How many times?:  (unk) Other Self Harm Risks:  (unk) Triggers for Past Attempts: Other (Comment) (unk) Intentional Self Injurious Behavior:  (unk) Risk to Others: Homicidal Ideation: No Thoughts of Harm to Others:  (unk) Current Homicidal Intent:  (unk) Current Homicidal Plan: No Access to Homicidal Means:  (unk) Identified Victim:  (unk) History of harm to  others?:  (unk) Assessment of Violence:  (unk) Violent Behavior Description:  (unk; patient was uncooperative w/ completing the assessment ) Does patient have access to weapons?:  (unk) Criminal Charges Pending?:  (unk) Does patient have a court date:  (unk) Prior Inpatient Therapy: Prior Inpatient Therapy:  (unk) Prior Therapy Dates:  (unk) Prior Therapy Facilty/Provider(s):  (unk) Reason for Treatment:  (unk) Prior Outpatient Therapy: Prior Outpatient Therapy:  (unk) Prior Therapy Dates:  (unk) Prior Therapy Facilty/Provider(s):  (unk) Reason for Treatment:  (unk) Does patient have an ACCT team?: Unknown Does patient have Intensive In-House Services?  : Unknown Does patient have [facility] services? : Unknown Does patient have P4CC services?: Unknown  Past Medical History:  Past Medical History:  Diagnosis Date  . Diabetes mellitus without complication ([place])   . Hyperlipidemia   . Hypertension   . Seizures ([place])   . Stroke [facility])    History reviewed. No pertinent surgical history. Family History: No family history on file. Family Psychiatric  History: none Social History:  History  Alcohol Use No     History  Drug Use No    Social History   Social History  . Marital status: Married    Spouse name: N/A  . Number of children: N/A  . Years of education: N/A   Social History Main Topics  . Smoking status: Never Smoker  . Smokeless tobacco: Never Used  . Alcohol use No  . Drug use: No  . Sexual activity: Not Asked   Other Topics Concern  . None   Social History Narrative  . None   Additional  Social History:    Allergies:  No Known Allergies  Labs:  Results for orders placed or performed during the hospital encounter of 06/25/17 (from the past 48 hour(s))  Comprehensive metabolic panel     Status: Abnormal   Collection Time: 06/25/17  1:59 PM  Result Value Ref Range   Sodium 139 135 - 145 mmol/L   Potassium 3.5 3.5 - 5.1 mmol/L   Chloride 101 101 - 111  mmol/L   CO2 30 22 - 32 mmol/L   Glucose, Bld 108 (H) 65 - 99 mg/dL   BUN 14 6 - 20 mg/dL   Creatinine, Ser 1.06 (H) 0.44 - 1.00 mg/dL   Calcium 9.3 8.9 - 10.3 mg/dL   Total Protein 7.7 6.5 - 8.1 g/dL   Albumin 4.4 3.5 - 5.0 g/dL   AST 17 15 - 41 U/L   ALT 12 (L) 14 - 54 U/L   Alkaline Phosphatase 73 38 - 126 U/L   Total Bilirubin 0.5 0.3 - 1.2 mg/dL   GFR calc non Af Amer 53 (L) >60 mL/min   GFR calc Af Amer >60 >60 mL/min    Comment: (NOTE) The eGFR has been calculated using the CKD EPI equation. This calculation has not been validated in all clinical situations. eGFR's persistently <60 mL/min signify possible Chronic Kidney Disease.    Anion gap 8 5 - 15  Ethanol     Status: None   Collection Time: 06/25/17  1:59 PM  Result Value Ref Range   Alcohol, Ethyl (B) <5 <5 mg/dL    Comment:        LOWEST DETECTABLE LIMIT FOR SERUM ALCOHOL IS 5 mg/dL FOR MEDICAL PURPOSES ONLY   Acetaminophen level     Status: Abnormal   Collection Time: 06/25/17  1:59 PM  Result Value Ref Range   Acetaminophen (Tylenol), Serum <10 (L) 10 - 30 ug/mL    Comment:        THERAPEUTIC CONCENTRATIONS VARY SIGNIFICANTLY. A RANGE OF 10-30 ug/mL MAY BE AN EFFECTIVE CONCENTRATION FOR MANY PATIENTS. HOWEVER, SOME ARE BEST TREATED AT CONCENTRATIONS OUTSIDE THIS RANGE. ACETAMINOPHEN CONCENTRATIONS >150 ug/mL AT 4 HOURS AFTER INGESTION AND >50 ug/mL AT 12 HOURS AFTER INGESTION ARE OFTEN ASSOCIATED WITH TOXIC REACTIONS.   Salicylate level     Status: None   Collection Time: 06/25/17  1:59 PM  Result Value Ref Range   Salicylate Lvl <0.8 2.8 - 30.0 mg/dL  CBC     Status: None   Collection Time: 06/25/17  1:59 PM  Result Value Ref Range   WBC 7.0 4.0 - 10.5 K/uL   RBC 4.15 3.87 - 5.11 MIL/uL   Hemoglobin 12.3 12.0 - 15.0 g/dL   HCT 37.1 36.0 - 46.0 %   MCV 89.4 78.0 - 100.0 fL   MCH 29.6 26.0 - 34.0 pg   MCHC 33.2 30.0 - 36.0 g/dL   RDW 13.0 11.5 - 15.5 %   Platelets 170 150 - 400 K/uL   Urinalysis, Routine w reflex microscopic     Status: Abnormal   Collection Time: 06/25/17  5:42 PM  Result Value Ref Range   Color, Urine STRAW (A) YELLOW   APPearance CLEAR CLEAR   Specific Gravity, Urine 1.008 1.005 - 1.030   pH 7.0 5.0 - 8.0   Glucose, UA NEGATIVE NEGATIVE mg/dL   Hgb urine dipstick SMALL (A) NEGATIVE   Bilirubin Urine NEGATIVE NEGATIVE   Ketones, ur 5 (A) NEGATIVE mg/dL   Protein, ur NEGATIVE NEGATIVE mg/dL  Nitrite NEGATIVE NEGATIVE   Leukocytes, UA TRACE (A) NEGATIVE   RBC / HPF 0-5 0 - 5 RBC/hpf   WBC, UA 0-5 0 - 5 WBC/hpf   Bacteria, UA NONE SEEN NONE SEEN   Squamous Epithelial / LPF 0-5 (A) NONE SEEN    Current Facility-Administered Medications  Medication Dose Route Frequency Provider Last Rate Last Dose  . acetaminophen (TYLENOL) tablet 650 mg  650 mg Oral Q4H PRN Isla Pence, MD      . alum & mag hydroxide-simeth (MAALOX/MYLANTA) 200-200-20 MG/5ML suspension 30 mL  30 mL Oral Q6H PRN Isla Pence, MD      . docusate sodium (COLACE) capsule 100 mg  100 mg Oral BID Isla Pence, MD   100 mg at 06/26/17 0942  . folic acid (FOLVITE) tablet 1 mg  1 mg Oral Daily Isla Pence, MD   1 mg at 06/26/17 0943  . hydrochlorothiazide (HYDRODIURIL) tablet 25 mg  25 mg Oral Daily Isla Pence, MD      . irbesartan (AVAPRO) tablet 300 mg  300 mg Oral Daily Isla Pence, MD      . lacosamide (VIMPAT) tablet 200 mg  200 mg Oral BID Isla Pence, MD   200 mg at 06/26/17 0940  . lamoTRIgine (LAMICTAL) tablet 150 mg  150 mg Oral BID Isla Pence, MD      . metoprolol tartrate (LOPRESSOR) tablet 25 mg  25 mg Oral BID Isla Pence, MD   25 mg at 06/26/17 0940  . ondansetron (ZOFRAN) tablet 4 mg  4 mg Oral Q8H PRN Isla Pence, MD      . potassium chloride (K-DUR,KLOR-CON) CR tablet 10 mEq  10 mEq Oral Daily Isla Pence, MD   10 mEq at 06/26/17 0943  . pravastatin (PRAVACHOL) tablet 10 mg  10 mg Oral QHS Isla Pence, MD      . traZODone  (DESYREL) tablet 50 mg  50 mg Oral QHS Patrecia Pour, NP       Current Outpatient Prescriptions  Medication Sig Dispense Refill  . clonazePAM (KLONOPIN) 1 MG tablet Take 0.5 mg by mouth 3 (three) times daily.    Marland Kitchen docusate sodium (COLACE) 100 MG capsule Take 100 mg by mouth 2 (two) times daily.    . folic acid (FOLVITE) 1 MG tablet Take 1 mg by mouth daily.    Marland Kitchen lacosamide (VIMPAT) 200 MG TABS tablet Take 1 tablet (200 mg total) by mouth 2 (two) times daily. 60 tablet 0  . LamoTRIgine (LAMICTAL XR) 300 MG TB24 Take 300 mg by mouth daily.    Marland Kitchen lamoTRIgine (LAMICTAL) 200 MG tablet Take 1 tablet (200 mg total) by mouth 2 (two) times daily. 60 tablet 0  . metoprolol tartrate (LOPRESSOR) 25 MG tablet Take 25 mg by mouth 2 (two) times daily.    . potassium chloride (K-DUR) 10 MEQ tablet Take 10 mEq by mouth daily.    . pravastatin (PRAVACHOL) 10 MG tablet Take 10 mg by mouth at bedtime.     . valsartan-hydrochlorothiazide (DIOVAN-HCT) 320-25 MG tablet Take 1 tablet by mouth daily.    . Vitamin D, Ergocalciferol, (DRISDOL) 50000 units CAPS capsule Take 50,000 Units by mouth every 7 (seven) days. On Sundays      Musculoskeletal: Strength & Muscle Tone: decreased Gait & Station: unsteady Patient leans: N/A  Psychiatric Specialty Exam: Physical Exam  Constitutional: She is oriented to person, place, and time. She appears well-developed and well-nourished.  HENT:  Head: Normocephalic.  Neck: Normal range  of motion.  Respiratory: Effort normal.  Musculoskeletal: Normal range of motion.  Neurological: She is alert and oriented to person, place, and time.  Psychiatric: Her speech is normal and behavior is normal. Thought content normal. Her affect is labile. Cognition and memory are impaired. She expresses impulsivity.    Review of Systems  Psychiatric/Behavioral: Positive for memory loss.  All other systems reviewed and are negative.   Blood pressure (!) 173/87, pulse 62, temperature 97.6  F (36.4 C), temperature source Axillary, resp. rate 18, SpO2 98 %.There is no height or weight on file to calculate BMI.  General Appearance: Casual  Eye Contact:  Good  Speech:  Normal Rate  Volume:  Normal  Mood:  Euthymic  Affect:  Congruent  Thought Process:  Coherent and Descriptions of Associations: Intact  Orientation:  Other:  person  Thought Content:  Illogical at times  Suicidal Thoughts:  No  Homicidal Thoughts:  No  Memory:  Immediate;   Poor Recent;   Poor Remote;   Poor  Judgement:  Impaired  Insight:  Lacking  Psychomotor Activity:  Decreased  Concentration:  Concentration: Poor and Attention Span: Poor  Recall:  Poor  Fund of Knowledge:  Fair  Language:  Fair  Akathisia:  No  Handed:  Right  AIMS (if indicated):     Assets:  Leisure Time Resilience Social Support  ADL's:  Intact  Cognition:  Impaired,  Moderate  Sleep:        Treatment Plan Summary: Daily contact with patient to assess and evaluate symptoms and progress in treatment, Medication management and Plan stroke with behavior changes:  -Crisis stabilization -Medication management:  Medical medications continued along with Lamictal 150 mg BID for seizures and Trazodone 50 mg at bedtime for sleep  -Individual counseling  Disposition: Recommend psychiatric Inpatient admission when medically cleared.  Waylan Boga, NP 06/26/2017 10:24 AM  Patient seen face-to-face for psychiatric evaluation, chart reviewed and case discussed with the physician extender and developed treatment plan. Reviewed the information documented and agree with the treatment plan. Corena Pilgrim, MD

## 2017-06-26 NOTE — ED Notes (Signed)
Pt cleaned and new bed linens placed on bed.

## 2017-06-26 NOTE — ED Notes (Signed)
Pt wanted to get out of bed and became physically aggressive with staff when she was told she could not get out of bed because she was unsteady on her feet. Pt began screaming and kicking. Pt was not redirectable. EDP was at bedside. An order for IM ativan and geodon was given as was non-violent restraints. Pt had a fall at home and a near fall in the ED. Pt is not aware of her own limitations with ambulation.

## 2017-06-26 NOTE — Progress Notes (Addendum)
CSW contacted patients spouse to inform him patient was not ready to be picked up. Please contact spouse once ready for discharge.   Stacy GardnerErin Cledith Kamiya, [facility] HospitalCSWA Emergency Room Clinical Social Worker [phone]
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
## 2017-06-26 NOTE — BH Assessment (Signed)
[facility]   06/26/2017; Per Dr. Jannifer FranklinAkintayo and Elta GuadeloupeLaurie Parks, [name], patient recommended for discharged. Dr. Jannifer FranklinAkintayo psychiatric cleared patient.  Patient husband contacted, patient husband reported he would come to pick up the patient around 2pm. Patient referred to social work to assist and follow up with discharge.
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
## 2017-06-26 NOTE — ED Notes (Signed)
Bed: WA24 Expected date:  Expected time:  Means of arrival:  Comments: 

## 2017-06-26 NOTE — ED Notes (Signed)
Pt attempting to get OOB.  Pt re-directed and repositioned in bed.

## 2017-06-27 DIAGNOSIS — R4189 Other symptoms and signs involving cognitive functions and awareness: Secondary | ICD-10-CM | POA: Diagnosis not present

## 2017-06-27 MED ORDER — LORAZEPAM 2 MG/ML IJ SOLN
2.0000 mg | Freq: Once | INTRAMUSCULAR | Status: AC
  Administered 2017-06-27: 2 mg via INTRAMUSCULAR
  Filled 2017-06-27: qty 1

## 2017-06-27 MED ORDER — OLANZAPINE 5 MG PO TBDP
5.0000 mg | ORAL_TABLET | Freq: Every day | ORAL | Status: DC
  Administered 2017-06-28 – 2017-06-29 (×2): 5 mg via ORAL
  Filled 2017-06-27 (×2): qty 1

## 2017-06-27 MED ORDER — HALOPERIDOL LACTATE 5 MG/ML IJ SOLN
5.0000 mg | Freq: Once | INTRAMUSCULAR | Status: AC
  Administered 2017-06-27: 5 mg via INTRAMUSCULAR
  Filled 2017-06-27: qty 1

## 2017-06-27 NOTE — ED Notes (Signed)
Pt reaching out and attempting to grab @ nothing visible, is calling spouse's name stating "The truth is the truth and a lie is a lie.  Jehovah told me that."  Pt encouraged to lie back and rest.
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
## 2017-06-27 NOTE — Consult Note (Signed)
[facility] Consult   Reason for Consult:  Post-stroke behavior changes Referring Physician:  EDP Patient Identification: Angela Fields MRN:  [id number] Principal Diagnosis: Cognitive and behavioral changes Diagnosis:   Patient Active Problem List   Diagnosis Date Noted  . Cognitive and behavioral changes [R41.89, R46.89] 06/26/2017    Priority: High  . Seizure ([place]) [R56.9] 06/19/2017  . Status epilepticus ([place]) [G40.901]   . Bradycardia [R00.1]   . Hypokalemia [E87.6]   . Hypertension [I10] 09/24/2016  . History of CVA (cerebrovascular accident) [Z86.73] 09/24/2016  . 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 Hyperlipidemia [E78.5] 09/24/2016    Total Time spent with patient: 30 minutes  Subjective:   Angela Fields is a 66 y.o. female patient admitted with aggression.  HPI:  66 yo female who recently had a major stroke with cognitive and behavior changes.  Medications started for her behaviors and geriatric psych being pursued.  Remains labile and unpredictable with aggression.  Past Psychiatric History: none prior to stroke  Risk to Self: None Risk to Others: Aggression Prior Inpatient Therapy: Prior Inpatient Therapy:  (unk) Prior Therapy Dates:  (unk) Prior Therapy Facilty/Provider(s):  (unk) Reason for Treatment:  (unk) Prior Outpatient Therapy: Prior Outpatient Therapy:  (unk) Prior Therapy Dates:  (unk) Prior Therapy Facilty/Provider(s):  (unk) Reason for Treatment:  (unk) Does patient have an ACCT team?: Unknown Does patient have Intensive In-House Services?  : Unknown Does patient have [facility] services? : Unknown Does patient have P4CC services?: Unknown  Past Medical History:  Past Medical History:  Diagnosis Date  . Diabetes mellitus without complication ([place])   . Hyperlipidemia   . Hypertension   . Seizures ([place])   . Stroke [facility])    History reviewed. No pertinent surgical history. Family  History: No family history on file. Family Psychiatric  History: none Social History:  History  Alcohol Use No     History  Drug Use No    Social History   Social History  . Marital status: Married    Spouse name: N/A  . Number of children: N/A  . Years of education: N/A   Social History Main Topics  . Smoking status: Never Smoker  . Smokeless tobacco: Never Used  . Alcohol use No  . Drug use: No  . Sexual activity: Not Asked   Other Topics Concern  . None   Social History Narrative  . None   Additional Social History:    Allergies:  No Known Allergies  Labs:  Results for orders placed or performed during the hospital encounter of 06/25/17 (from the past 48 hour(s))  Comprehensive metabolic panel     Status: Abnormal   Collection Time: 06/25/17  1:59 PM  Result Value Ref Range   Sodium 139 135 - 145 mmol/L   Potassium 3.5 3.5 - 5.1 mmol/L   Chloride 101 101 - 111 mmol/L   CO2 30 22 - 32 mmol/L   Glucose, Bld 108 (H) 65 - 99 mg/dL   BUN 14 6 - 20 mg/dL   Creatinine, Ser 1.06 (H) 0.44 - 1.00 mg/dL   Calcium 9.3 8.9 - 10.3 mg/dL   Total Protein 7.7 6.5 - 8.1 g/dL   Albumin 4.4 3.5 - 5.0 g/dL   AST 17 15 - 41 U/L   ALT 12 (L) 14 - 54 U/L   Alkaline Phosphatase 73 38 - 126 U/L   Total Bilirubin 0.5 0.3 - 1.2 mg/dL   GFR calc non  Af Amer 53 (L) >60 mL/min   GFR calc Af Amer >60 >60 mL/min    Comment: (NOTE) The eGFR has been calculated using the CKD EPI equation. This calculation has not been validated in all clinical situations. eGFR's persistently <60 mL/min signify possible Chronic Kidney Disease.    Anion gap 8 5 - 15  Ethanol     Status: None   Collection Time: 06/25/17  1:59 PM  Result Value Ref Range   Alcohol, Ethyl (B) <5 <5 mg/dL    Comment:        LOWEST DETECTABLE LIMIT FOR SERUM ALCOHOL IS 5 mg/dL FOR MEDICAL PURPOSES ONLY   Acetaminophen level     Status: Abnormal   Collection Time: 06/25/17  1:59 PM  Result Value Ref Range    Acetaminophen (Tylenol), Serum <10 (L) 10 - 30 ug/mL    Comment:        THERAPEUTIC CONCENTRATIONS VARY SIGNIFICANTLY. A RANGE OF 10-30 ug/mL MAY BE AN EFFECTIVE CONCENTRATION FOR MANY PATIENTS. HOWEVER, SOME ARE BEST TREATED AT CONCENTRATIONS OUTSIDE THIS RANGE. ACETAMINOPHEN CONCENTRATIONS >150 ug/mL AT 4 HOURS AFTER INGESTION AND >50 ug/mL AT 12 HOURS AFTER INGESTION ARE OFTEN ASSOCIATED WITH TOXIC REACTIONS.   Salicylate level     Status: None   Collection Time: 06/25/17  1:59 PM  Result Value Ref Range   Salicylate Lvl <8.4 2.8 - 30.0 mg/dL  CBC     Status: None   Collection Time: 06/25/17  1:59 PM  Result Value Ref Range   WBC 7.0 4.0 - 10.5 K/uL   RBC 4.15 3.87 - 5.11 MIL/uL   Hemoglobin 12.3 12.0 - 15.0 g/dL   HCT 37.1 36.0 - 46.0 %   MCV 89.4 78.0 - 100.0 fL   MCH 29.6 26.0 - 34.0 pg   MCHC 33.2 30.0 - 36.0 g/dL   RDW 13.0 11.5 - 15.5 %   Platelets 170 150 - 400 K/uL  Urinalysis, Routine w reflex microscopic     Status: Abnormal   Collection Time: 06/25/17  5:42 PM  Result Value Ref Range   Color, Urine STRAW (A) YELLOW   APPearance CLEAR CLEAR   Specific Gravity, Urine 1.008 1.005 - 1.030   pH 7.0 5.0 - 8.0   Glucose, UA NEGATIVE NEGATIVE mg/dL   Hgb urine dipstick SMALL (A) NEGATIVE   Bilirubin Urine NEGATIVE NEGATIVE   Ketones, ur 5 (A) NEGATIVE mg/dL   Protein, ur NEGATIVE NEGATIVE mg/dL   Nitrite NEGATIVE NEGATIVE   Leukocytes, UA TRACE (A) NEGATIVE   RBC / HPF 0-5 0 - 5 RBC/hpf   WBC, UA 0-5 0 - 5 WBC/hpf   Bacteria, UA NONE SEEN NONE SEEN   Squamous Epithelial / LPF 0-5 (A) NONE SEEN    Current Facility-Administered Medications  Medication Dose Route Frequency Provider Last Rate Last Dose  . acetaminophen (TYLENOL) tablet 650 mg  650 mg Oral Q4H PRN Isla Pence, MD      . alum & mag hydroxide-simeth (MAALOX/MYLANTA) 200-200-20 MG/5ML suspension 30 mL  30 mL Oral Q6H PRN Isla Pence, MD      . docusate sodium (COLACE) capsule 100 mg  100  mg Oral BID Isla Pence, MD   100 mg at 06/26/17 2209  . folic acid (FOLVITE) tablet 1 mg  1 mg Oral Daily Isla Pence, MD   1 mg at 06/26/17 0943  . hydrochlorothiazide (HYDRODIURIL) tablet 25 mg  25 mg Oral Daily Isla Pence, MD      . irbesartan (  AVAPRO) tablet 300 mg  300 mg Oral Daily Isla Pence, MD      . lacosamide (VIMPAT) tablet 200 mg  200 mg Oral BID Isla Pence, MD   200 mg at 06/26/17 2208  . lamoTRIgine (LAMICTAL) tablet 150 mg  150 mg Oral BID Isla Pence, MD   150 mg at 06/26/17 2209  . metoprolol tartrate (LOPRESSOR) tablet 25 mg  25 mg Oral BID Isla Pence, MD   25 mg at 06/26/17 2209  . OLANZapine zydis (ZYPREXA) disintegrating tablet 5 mg  5 mg Oral QHS Susannah Carbin, MD      . ondansetron (ZOFRAN) tablet 4 mg  4 mg Oral Q8H PRN Isla Pence, MD      . potassium chloride (K-DUR,KLOR-CON) CR tablet 10 mEq  10 mEq Oral Daily Isla Pence, MD   10 mEq at 06/26/17 0943  . pravastatin (PRAVACHOL) tablet 10 mg  10 mg Oral QHS Isla Pence, MD   10 mg at 06/26/17 2209  . traZODone (DESYREL) tablet 50 mg  50 mg Oral QHS Patrecia Pour, NP   50 mg at 06/26/17 2209   Current Outpatient Prescriptions  Medication Sig Dispense Refill  . clonazePAM (KLONOPIN) 1 MG tablet Take 0.5 mg by mouth 3 (three) times daily.    [place] Kitchen docusate sodium (COLACE) 100 MG capsule Take 100 mg by mouth 2 (two) times daily.    . folic acid (FOLVITE) 1 MG tablet Take 1 mg by mouth daily.    Marland Kitchen lacosamide (VIMPAT) 200 MG TABS tablet Take 1 tablet (200 mg total) by mouth 2 (two) times daily. 60 tablet 0  . LamoTRIgine (LAMICTAL XR) 300 MG TB24 Take 300 mg by mouth daily.    Marland Kitchen lamoTRIgine (LAMICTAL) 200 MG tablet Take 1 tablet (200 mg total) by mouth 2 (two) times daily. 60 tablet 0  . metoprolol tartrate (LOPRESSOR) 25 MG tablet Take 25 mg by mouth 2 (two) times daily.    . potassium chloride (K-DUR) 10 MEQ tablet Take 10 mEq by mouth daily.    . pravastatin (PRAVACHOL) 10  MG tablet Take 10 mg by mouth at bedtime.     . valsartan-hydrochlorothiazide (DIOVAN-HCT) 320-25 MG tablet Take 1 tablet by mouth daily.    . Vitamin D, Ergocalciferol, (DRISDOL) 50000 units CAPS capsule Take 50,000 Units by mouth every 7 (seven) days. On Sundays    . traZODone (DESYREL) 50 MG tablet Take 1 tablet (50 mg total) by mouth at bedtime. 30 tablet 0    Musculoskeletal: Strength & Muscle Tone: decreased Gait & Station: unsteady Patient leans: N/A  Psychiatric Specialty Exam: Physical Exam  Constitutional: She is oriented to person, place, and time. She appears well-developed and well-nourished.  HENT:  Head: Normocephalic.  Neck: Normal range of motion.  Respiratory: Effort normal.  Musculoskeletal: Normal range of motion.  Neurological: She is alert and oriented to person, place, and time.  Psychiatric: Her speech is normal and behavior is normal. Thought content normal. Her affect is labile. Cognition and memory are impaired. She expresses impulsivity.    Review of Systems  Psychiatric/Behavioral: Positive for memory loss.  All other systems reviewed and are negative.   Blood pressure (!) 148/51, pulse 63, temperature 97.9 F (36.6 C), temperature source Axillary, resp. rate 16, SpO2 95 %.There is no height or weight on file to calculate BMI.  General Appearance: Casual  Eye Contact:  Good  Speech:  Normal Rate  Volume:  Normal  Mood:  Euthymic  Affect:  Congruent  Thought Process:  Coherent and Descriptions of Associations: Intact  Orientation:  Other:  person  Thought Content:  Illogical at times  Suicidal Thoughts:  No  Homicidal Thoughts:  No  Memory:  Immediate;   Poor Recent;   Poor Remote;   Poor  Judgement:  Impaired  Insight:  Lacking  Psychomotor Activity:  Decreased  Concentration:  Concentration: Poor and Attention Span: Poor  Recall:  Poor  Fund of Knowledge:  Fair  Language:  Fair  Akathisia:  No  Handed:  Right  AIMS (if indicated):      Assets:  Leisure Time Resilience Social Support  ADL's:  Intact  Cognition:  Impaired,  Moderate  Sleep:        Treatment Plan Summary: Daily contact with patient to assess and evaluate symptoms and progress in treatment, Medication management and Plan stroke with behavior changes:  -Crisis stabilization -Medication management:  Medical medications continued along with Lamictal 150 mg BID for seizures, and Trazodone 50 mg at bedtime for sleep.  Zyprexa 5 mg at bedtime for mood stabilization -Individual counseling  Disposition: Recommend psychiatric Inpatient admission when medically cleared.  Waylan Boga, NP 06/27/2017 12:22 PM  Patient seen face-to-face for psychiatric evaluation, chart reviewed and case discussed with the physician extender and developed treatment plan. Reviewed the information documented and agree with the treatment plan. Corena Pilgrim, MD

## 2017-06-27 NOTE — ED Notes (Signed)
Encouraged fluids and snack-patient still refusing meds in applesauce

## 2017-06-27 NOTE — ED Notes (Signed)
Patient refusing medication at this time-unable to redirect-becoming aggressive-will attempt to give meds at a later time-meds crushed and placed in applesauce

## 2017-06-27 NOTE — ED Notes (Signed)
Bed: WA20 Expected date:  Expected time:  Means of arrival:  Comments: TCU 29

## 2017-06-27 NOTE — ED Notes (Signed)
Patient ate a small amount of applesauce with a small amount of her crushed meds

## 2017-06-27 NOTE — B[facility] Counselor (Addendum)
06/27/2017: Per Consuella Lose, RN: Dr. Nicanor Alcon reported, the pt is not psychiatrically cleared. Per Palumbo recommended AM Psychiatric Evaluation.   Redmond Pulling, MS, [facility], [facility] Triage Specialist [phone]

## 2017-06-27 NOTE — ED Notes (Addendum)
Angela Fields, TTS, informed of Dr. Sharen Hones request for re-evaluation.  Per Angela Fields, will leave a note for oncoming shift.

## 2017-06-27 NOTE — ED Notes (Signed)
Patient attempting to get out of bed-fighting with staff-unable to redirect at this time-MD aware

## 2017-06-28 DIAGNOSIS — R4189 Other symptoms and signs involving cognitive functions and awareness: Secondary | ICD-10-CM | POA: Diagnosis not present

## 2017-06-28 NOTE — Progress Notes (Signed)
Patient has been referred to the following facilities: [facility]-  No beds available [facility]- no answer; left voice message on 9/15 [facility]- no beds available [facility]- beds available; faxed referral for review [facility] beds available; faxed referral for review  [facility]-no answer [facility]- beds available; faxed referrals for review   CSW will continue to seek placement.   Dayan Kreis, LCSWA Clinical Social Worker [facility] 

## 2017-06-28 NOTE — ED Notes (Signed)
Bed: WA29 Expected date:  Expected time:  Means of arrival:  Comments: 

## 2017-06-28 NOTE — BH Assessment (Addendum)
[facility] Assessment Progress Note Patient was seen this date by this writer to re-evaluate and assess treatment progress.  Patient is refusing to answer any questions and is demanding to know who this Clinical research associate is. This writer attempted several times to explain to the patient that this writer was part of the clinical care team and was attempting to evaluate/discuss her care and progress. Patient states at this time she is "not interested" in speaking to this writer at this time. Per notes review patient has been combative with staff and refusing medication/s. Case was staffed with Akintayo MD who recommended continued inpatient monitoring as appropriate bed placement is investigated.

## 2017-06-28 NOTE — ED Notes (Signed)
Bed: WHALC Expected date:  Expected time:  Means of arrival:  Comments: 

## 2017-06-29 DIAGNOSIS — F39 Unspecified mood [affective] disorder: Secondary | ICD-10-CM | POA: Diagnosis not present

## 2017-06-29 DIAGNOSIS — R4189 Other symptoms and signs involving cognitive functions and awareness: Secondary | ICD-10-CM | POA: Diagnosis not present

## 2017-06-29 DIAGNOSIS — R413 Other amnesia: Secondary | ICD-10-CM | POA: Diagnosis not present

## 2017-06-29 DIAGNOSIS — R569 Unspecified convulsions: Secondary | ICD-10-CM | POA: Diagnosis not present

## 2017-06-29 DIAGNOSIS — R4689 Other symptoms and signs involving appearance and behavior: Secondary | ICD-10-CM | POA: Diagnosis not present

## 2017-06-29 DIAGNOSIS — G40901 Epilepsy, unspecified, not intractable, with status epilepticus: Secondary | ICD-10-CM | POA: Diagnosis not present

## 2017-06-29 NOTE — ED Notes (Signed)
Patient belonging placed in designated cabinet behind nursing station.

## 2017-06-29 NOTE — ED Notes (Signed)
Bed: WA29 Expected date:  Expected time:  Means of arrival:  Comments: 

## 2017-06-29 NOTE — ED Notes (Signed)
Bed: WHALB Expected date:  Expected time:  Means of arrival:  Comments: 

## 2017-06-29 NOTE — ED Notes (Signed)
Bed: WA26 Expected date:  Expected time:  Means of arrival:  Comments: 

## 2017-06-29 NOTE — Consult Note (Signed)
[facility] Face-to-Face Psychiatry Consult   Reason for Consult:  Behavioral change Referring Physician:  EDP Patient Identification: Angela Fields MRN:  [id number] Principal Diagnosis: Cognitive and behavioral changes Diagnosis:   Patient Active Problem List   Diagnosis Date Noted  . Cognitive and behavioral changes [R41.89, R46.89] 06/26/2017  . Seizure ([facility]) [R56.9] 06/19/2017  . Status epilepticus ([facility]) [G40.901]   . Bradycardia [R00.1]   . Hypokalemia [E87.6]   . Hypertension [I10] 09/24/2016  . History of CVA (cerebrovascular accident) [Z86.73] 09/24/2016  . 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 Hyperlipidemia [E78.5] 09/24/2016    Total Time spent with patient: 45 minutes  Subjective:   Angela Fields is a 66 y.o. female patient admitted with cognitive and behavioral changes.  HPI:  Pt was seen and chart reviewed with treatment team and Dr Jannifer Franklin. Pt [facility] the [facility] with cognitive and behavioral issues. Initially, on admission,  Pt would not answer any assessment questions. Today, Pt presents as pleasant, laughing, and talkative. Pt is oriented to self only. Pt was able to answer some assessment questions but unable to say where she is and why. Pt appears to be more engaged with staff today. Inpatient gero psychiatric admission recommended.    Past Psychiatric History: As above  Risk to Self: Suicidal Ideation:  (UTA) Suicidal Intent:  (UTA) Is patient at risk for suicide?:  (unk ) Suicidal Plan?:  (UTA) Access to Means:  (UTA) What has been your use of drugs/alcohol within the last 12 months?:  (unk) How many times?:  (unk) Other Self Harm Risks:  (unk) Triggers for Past Attempts: Other (Comment) (unk) Intentional Self Injurious Behavior:  (unk) Risk to Others: Homicidal Ideation: No Thoughts of Harm to Others:  (unk) Current Homicidal Intent:  (unk) Current Homicidal Plan: No Access to Homicidal Means:   (unk) Identified Victim:  (unk) History of harm to others?:  (unk) Assessment of Violence:  (unk) Violent Behavior Description:  (unk; patient was uncooperative w/ completing the assessment ) Does patient have access to weapons?:  (unk) Criminal Charges Pending?:  (unk) Does patient have a court date:  (unk) Prior Inpatient Therapy: Prior Inpatient Therapy:  (unk) Prior Therapy Dates:  (unk) Prior Therapy Facilty/Provider(s):  (unk) Reason for Treatment:  (unk) Prior Outpatient Therapy: Prior Outpatient Therapy:  (unk) Prior Therapy Dates:  (unk) Prior Therapy Facilty/Provider(s):  (unk) Reason for Treatment:  (unk) Does patient have an ACCT team?: Unknown Does patient have Intensive In-House Services?  : Unknown Does patient have [facility] services? : Unknown Does patient have P4CC services?: Unknown  Past Medical History:  Past Medical History:  Diagnosis Date  . Diabetes mellitus without complication ([facility])   . Hyperlipidemia   . Hypertension   . Seizures ([facility])   . Stroke [facility])    History reviewed. No pertinent surgical history. Family History: No family history on file. Family Psychiatric  History:Unknown Social History:  History  Alcohol Use No     History  Drug Use No    Social History   Social History  . Marital status: Married    Spouse name: N/A  . Number of children: N/A  . Years of education: N/A   Social History Main Topics  . Smoking status: Never Smoker  . Smokeless tobacco: Never Used  . Alcohol use No  . Drug use: No  . Sexual activity: Not Asked   Other Topics Concern  . None   Social History Narrative  . None  Additional Social History:    Allergies:  No Known Allergies  Labs: No results found for this or any previous visit (from the past 48 hour(s)).  Current Facility-Administered Medications  Medication Dose Route Frequency Provider Last Rate Last Dose  . acetaminophen (TYLENOL) tablet 650 mg  650 mg Oral Q4H PRN Jacalyn Lefevre,  MD      . alum & mag hydroxide-simeth (MAALOX/MYLANTA) 200-200-20 MG/5ML suspension 30 mL  30 mL Oral Q6H PRN Jacalyn Lefevre, MD      . docusate sodium (COLACE) capsule 100 mg  100 mg Oral BID Jacalyn Lefevre, MD   100 mg at 06/29/17 0904  . folic acid (FOLVITE) tablet 1 mg  1 mg Oral Daily Jacalyn Lefevre, MD   1 mg at 06/29/17 0904  . hydrochlorothiazide (HYDRODIURIL) tablet 25 mg  25 mg Oral Daily Jacalyn Lefevre, MD   25 mg at 06/29/17 0903  . irbesartan (AVAPRO) tablet 300 mg  300 mg Oral Daily Jacalyn Lefevre, MD   300 mg at 06/29/17 0907  . lacosamide (VIMPAT) tablet 200 mg  200 mg Oral BID Jacalyn Lefevre, MD   200 mg at 06/29/17 0902  . lamoTRIgine (LAMICTAL) tablet 150 mg  150 mg Oral BID Jacalyn Lefevre, MD   150 mg at 06/29/17 0903  . metoprolol tartrate (LOPRESSOR) tablet 25 mg  25 mg Oral BID Jacalyn Lefevre, MD   25 mg at 06/29/17 0903  . OLANZapine zydis (ZYPREXA) disintegrating tablet 5 mg  5 mg Oral QHS Shadrick Senne, MD   5 mg at 06/28/17 2158  . ondansetron (ZOFRAN) tablet 4 mg  4 mg Oral Q8H PRN Jacalyn Lefevre, MD      . potassium chloride (K-DUR,KLOR-CON) CR tablet 10 mEq  10 mEq Oral Daily Jacalyn Lefevre, MD   10 mEq at 06/29/17 0903  . pravastatin (PRAVACHOL) tablet 10 mg  10 mg Oral QHS Jacalyn Lefevre, MD   10 mg at 06/28/17 2158  . traZODone (DESYREL) tablet 50 mg  50 mg Oral QHS Charm Rings, NP   50 mg at 06/28/17 2158   Current Outpatient Prescriptions  Medication Sig Dispense Refill  . clonazePAM (KLONOPIN) 1 MG tablet Take 0.5 mg by mouth 3 (three) times daily.    Marland Kitchen docusate sodium (COLACE) 100 MG capsule Take 100 mg by mouth 2 (two) times daily.    . folic acid (FOLVITE) 1 MG tablet Take 1 mg by mouth daily.    Marland Kitchen lacosamide (VIMPAT) 200 MG TABS tablet Take 1 tablet (200 mg total) by mouth 2 (two) times daily. 60 tablet 0  . LamoTRIgine (LAMICTAL XR) 300 MG TB24 Take 300 mg by mouth daily.    Marland Kitchen lamoTRIgine (LAMICTAL) 200 MG tablet Take 1 tablet (200 mg  total) by mouth 2 (two) times daily. 60 tablet 0  . metoprolol tartrate (LOPRESSOR) 25 MG tablet Take 25 mg by mouth 2 (two) times daily.    . potassium chloride (K-DUR) 10 MEQ tablet Take 10 mEq by mouth daily.    . pravastatin (PRAVACHOL) 10 MG tablet Take 10 mg by mouth at bedtime.     . valsartan-hydrochlorothiazide (DIOVAN-HCT) 320-25 MG tablet Take 1 tablet by mouth daily.    . Vitamin D, Ergocalciferol, (DRISDOL) 50000 units CAPS capsule Take 50,000 Units by mouth every 7 (seven) days. On Sundays    . traZODone (DESYREL) 50 MG tablet Take 1 tablet (50 mg total) by mouth at bedtime. 30 tablet 0    Musculoskeletal: Strength & Muscle Tone:  within normal limits Gait & Station: normal Patient leans: N/A  Psychiatric Specialty Exam: Physical Exam  Constitutional: She appears well-developed and well-nourished.  HENT:  Head: Normocephalic.  Respiratory: Effort normal.  Musculoskeletal: Normal range of motion.  Neurological: She is alert.  Psychiatric: Her speech is normal. Cognition and memory are impaired. She exhibits abnormal recent memory and abnormal remote memory.    Review of Systems  Psychiatric/Behavioral: Positive for memory loss. Negative for depression, hallucinations, substance abuse and suicidal ideas. The patient is not nervous/anxious and does not have insomnia.   All other systems reviewed and are negative.   Blood pressure 111/69, pulse 78, temperature 98.3 F (36.8 C), temperature source Oral, resp. rate 18, SpO2 98 %.There is no height or weight on file to calculate BMI.  General Appearance: Casual  Eye Contact:  Fair  Speech:  Blocked and Slow  Volume:  Normal  Mood:  Euthymic  Affect:  Congruent  Thought Process:  Disorganized  Orientation:  Other:  self only  Thought Content:  Illogical  Suicidal Thoughts:  No  Homicidal Thoughts:  No  Memory:  Immediate;   Poor Recent;   Poor Remote;   Poor  Judgement:  Poor  Insight:  Lacking  Psychomotor  Activity:  Normal  Concentration:  Concentration: Fair and Attention Span: Fair  Recall:  Poor  Fund of Knowledge:  Fair  Language:  Good  Akathisia:  No  Handed:  Right  AIMS (if indicated):     Assets:  Financial Resources/Insurance Housing Social Support  ADL's:  Intact  Cognition:  Impaired,  Moderate  Sleep:        Treatment Plan Summary: Daily contact with patient to assess and evaluate symptoms and progress in treatment and Medication management  - Crisis stabilization -Continue these medications: Lamictal 150 mg BID for mood stabilization Zyprexa zydis 5 mg QHS for mood stabilization Trazodone 50 mg QHS for sleep   Disposition: Recommend psychiatric Inpatient admission when medically cleared. TTS to seek placemement  Laveda Abbe, NP 06/29/2017 1:03 PM  Patient seen face-to-face for psychiatric evaluation, chart reviewed and case discussed with the physician extender and developed treatment plan. Reviewed the information documented and agree with the treatment plan. Thedore Mins, MD

## 2017-06-30 DIAGNOSIS — R4189 Other symptoms and signs involving cognitive functions and awareness: Secondary | ICD-10-CM | POA: Diagnosis not present

## 2017-06-30 MED ORDER — HALOPERIDOL LACTATE 5 MG/ML IJ SOLN
5.0000 mg | Freq: Once | INTRAMUSCULAR | Status: AC
  Administered 2017-06-30: 5 mg via INTRAMUSCULAR
  Filled 2017-06-30: qty 1

## 2017-06-30 NOTE — Progress Notes (Signed)
06/30/17  1324  Called the [facility] and left a message that patient need to be transported to [facility] in [place], [place].

## 2017-06-30 NOTE — BH Assessment (Addendum)
Received a call from [place] with [facility]. The accepting provider is by Dr. Jennelle Human. The nursing report [phone].  Patient is under IVC and pending sheriff transport to the facility.

## 2017-06-30 NOTE — BH Assessment (Signed)
Received a call from [place] with [facility]. States that they will take patient once IVC is received. Writer faxed IVC to [phone].

## 2017-06-30 NOTE — Progress Notes (Signed)
06/30/17  1305  Called [facility] to give report. Left message on machine to called back.
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
## 2017-06-30 NOTE — ED Notes (Signed)
Patient slept for about 30 minutes and is ranting very loudly since about 2 am.

## 2017-06-30 NOTE — Progress Notes (Signed)
06/30/17  1320  Called [address] report given to [place].

## 2017-07-29 ENCOUNTER — Encounter (HOSPITAL_COMMUNITY): Payer: Self-pay | Admitting: Nurse Practitioner

## 2017-07-29 ENCOUNTER — Emergency Department (HOSPITAL_COMMUNITY): Payer: Medicare HMO

## 2017-07-29 ENCOUNTER — Inpatient Hospital Stay (HOSPITAL_COMMUNITY)
Admission: EM | Admit: 2017-07-29 | Discharge: 2017-08-05 | DRG: 638 | Disposition: A | Discharge status: Home / Self Care | Payer: Medicare HMO | Attending: Internal Medicine | Admitting: Internal Medicine

## 2017-07-29 DIAGNOSIS — W19XXXA Unspecified fall, initial encounter: Secondary | ICD-10-CM

## 2017-07-29 DIAGNOSIS — E11649 Type 2 diabetes mellitus with hypoglycemia without coma: Principal | ICD-10-CM | POA: Diagnosis present

## 2017-07-29 DIAGNOSIS — G40909 Epilepsy, unspecified, not intractable, without status epilepticus: Secondary | ICD-10-CM | POA: Diagnosis present

## 2017-07-29 DIAGNOSIS — F0391 Unspecified dementia with behavioral disturbance: Secondary | ICD-10-CM | POA: Diagnosis present

## 2017-07-29 DIAGNOSIS — E785 Hyperlipidemia, unspecified: Secondary | ICD-10-CM | POA: Diagnosis present

## 2017-07-29 DIAGNOSIS — E162 Hypoglycemia, unspecified: Secondary | ICD-10-CM | POA: Diagnosis not present

## 2017-07-29 DIAGNOSIS — I700 Atherosclerosis of aorta: Secondary | ICD-10-CM | POA: Diagnosis present

## 2017-07-29 DIAGNOSIS — R001 Bradycardia, unspecified: Secondary | ICD-10-CM | POA: Diagnosis present

## 2017-07-29 DIAGNOSIS — I10 Essential (primary) hypertension: Secondary | ICD-10-CM | POA: Diagnosis present

## 2017-07-29 DIAGNOSIS — Z8673 Personal history of transient ischemic attack (TIA), and cerebral infarction without residual deficits: Secondary | ICD-10-CM

## 2017-07-29 DIAGNOSIS — G4733 Obstructive sleep apnea (adult) (pediatric): Secondary | ICD-10-CM | POA: Diagnosis present

## 2017-07-29 DIAGNOSIS — Z7982 Long term (current) use of aspirin: Secondary | ICD-10-CM

## 2017-07-29 DIAGNOSIS — R569 Unspecified convulsions: Secondary | ICD-10-CM

## 2017-07-29 DIAGNOSIS — Z79899 Other long term (current) drug therapy: Secondary | ICD-10-CM

## 2017-07-29 DIAGNOSIS — K5900 Constipation, unspecified: Secondary | ICD-10-CM | POA: Diagnosis present

## 2017-07-29 HISTORY — DX: Unspecified dementia, unspecified severity, without behavioral disturbance, psychotic disturbance, mood disturbance, and anxiety: F03.90

## 2017-07-29 LAB — CBC WITH DIFFERENTIAL/PLATELET
BASOS PCT: 0 %
Basophils Absolute: 0 10*3/uL (ref 0.0–0.1)
EOS ABS: 0.2 10*3/uL (ref 0.0–0.7)
EOS PCT: 3 %
HCT: 37.8 % (ref 36.0–46.0)
Hemoglobin: 12.4 g/dL (ref 12.0–15.0)
LYMPHS ABS: 1.5 10*3/uL (ref 0.7–4.0)
Lymphocytes Relative: 27 %
MCH: 30.2 pg (ref 26.0–34.0)
MCHC: 32.8 g/dL (ref 30.0–36.0)
MCV: 92.2 fL (ref 78.0–100.0)
Monocytes Absolute: 0.3 10*3/uL (ref 0.1–1.0)
Monocytes Relative: 5 %
Neutro Abs: 3.7 10*3/uL (ref 1.7–7.7)
Neutrophils Relative %: 65 %
PLATELETS: 268 10*3/uL (ref 150–400)
RBC: 4.1 MIL/uL (ref 3.87–5.11)
RDW: 14.3 % (ref 11.5–15.5)
WBC: 5.6 10*3/uL (ref 4.0–10.5)

## 2017-07-29 LAB — COMPREHENSIVE METABOLIC PANEL
ALK PHOS: 96 U/L (ref 38–126)
ALT: 19 U/L (ref 14–54)
AST: 19 U/L (ref 15–41)
Albumin: 4.3 g/dL (ref 3.5–5.0)
Anion gap: 9 (ref 5–15)
BILIRUBIN TOTAL: 0.4 mg/dL (ref 0.3–1.2)
BUN: 9 mg/dL (ref 6–20)
CALCIUM: 9.1 mg/dL (ref 8.9–10.3)
CHLORIDE: 102 mmol/L (ref 101–111)
CO2: 30 mmol/L (ref 22–32)
CREATININE: 1.24 mg/dL — AB (ref 0.44–1.00)
GFR, EST AFRICAN AMERICAN: 51 mL/min — AB (ref >60)
GFR, EST NON AFRICAN AMERICAN: 44 mL/min — AB (ref >60)
Glucose, Bld: 84 mg/dL (ref 65–99)
Potassium: 3.7 mmol/L (ref 3.5–5.1)
SODIUM: 141 mmol/L (ref 135–145)
Total Protein: 7.7 g/dL (ref 6.5–8.1)

## 2017-07-29 LAB — CBG MONITORING, ED
GLUCOSE-CAPILLARY: 52 mg/dL — AB (ref 65–99)
GLUCOSE-CAPILLARY: 49 mg/dL — AB (ref 65–99)
GLUCOSE-CAPILLARY: 96 mg/dL (ref 65–99)
Glucose-Capillary: 52 mg/dL — ABNORMAL LOW (ref 65–99)
Glucose-Capillary: 75 mg/dL (ref 65–99)
Glucose-Capillary: 59 mg/dL — ABNORMAL LOW (ref 65–99)
Glucose-Capillary: 58 mg/dL — ABNORMAL LOW (ref 65–99)

## 2017-07-29 LAB — URINALYSIS, ROUTINE W REFLEX MICROSCOPIC
Bilirubin Urine: NEGATIVE
Glucose, UA: NEGATIVE mg/dL
HGB URINE DIPSTICK: NEGATIVE
KETONES UR: NEGATIVE mg/dL
Leukocytes, UA: NEGATIVE
NITRITE: NEGATIVE
Protein, ur: NEGATIVE mg/dL
Specific Gravity, Urine: 1.005 (ref 1.005–1.030)
pH: 7 (ref 5.0–8.0)

## 2017-07-29 LAB — I-STAT TROPONIN, ED: Troponin i, poc: 0 ng/mL (ref 0.00–0.08)

## 2017-07-29 LAB — AMMONIA: AMMONIA: 11 umol/L (ref 9–35)

## 2017-07-29 MED ORDER — IRBESARTAN 300 MG PO TABS
300.0000 mg | ORAL_TABLET | Freq: Every day | ORAL | Status: DC
  Administered 2017-07-30 – 2017-08-02 (×4): 300 mg via ORAL
  Filled 2017-07-29 (×4): qty 1

## 2017-07-29 MED ORDER — POTASSIUM CHLORIDE ER 10 MEQ PO TBCR
10.0000 meq | EXTENDED_RELEASE_TABLET | Freq: Every day | ORAL | Status: DC
  Administered 2017-07-30 – 2017-08-05 (×7): 10 meq via ORAL
  Filled 2017-07-29 (×14): qty 1

## 2017-07-29 MED ORDER — QUETIAPINE FUMARATE ER 300 MG PO TB24
300.0000 mg | ORAL_TABLET | Freq: Every day | ORAL | Status: DC
  Administered 2017-07-30 – 2017-08-04 (×7): 300 mg via ORAL
  Filled 2017-07-29 (×8): qty 1

## 2017-07-29 MED ORDER — METOPROLOL TARTRATE 25 MG PO TABS
25.0000 mg | ORAL_TABLET | Freq: Two times a day (BID) | ORAL | Status: DC
  Administered 2017-07-30 – 2017-08-01 (×6): 25 mg via ORAL
  Filled 2017-07-29 (×7): qty 1

## 2017-07-29 MED ORDER — LAMOTRIGINE 100 MG PO TABS
200.0000 mg | ORAL_TABLET | Freq: Two times a day (BID) | ORAL | Status: DC
  Administered 2017-07-30 – 2017-08-05 (×14): 200 mg via ORAL
  Filled 2017-07-29 (×14): qty 2

## 2017-07-29 MED ORDER — DEXTROSE 50 % IV SOLN
25.0000 mL | Freq: Once | INTRAVENOUS | Status: AC
  Administered 2017-07-29: 25 mL via INTRAVENOUS
  Filled 2017-07-29: qty 50

## 2017-07-29 MED ORDER — LACOSAMIDE 50 MG PO TABS
150.0000 mg | ORAL_TABLET | Freq: Two times a day (BID) | ORAL | Status: DC
  Administered 2017-07-30 – 2017-08-05 (×14): 150 mg via ORAL
  Filled 2017-07-29 (×14): qty 3

## 2017-07-29 MED ORDER — VALSARTAN-HYDROCHLOROTHIAZIDE 320-25 MG PO TABS: 1.0000 | ORAL_TABLET | Freq: Every day | ORAL | Status: DC

## 2017-07-29 MED ORDER — FOLIC ACID 1 MG PO TABS
1.0000 mg | ORAL_TABLET | Freq: Every day | ORAL | Status: DC
  Administered 2017-07-30 – 2017-08-05 (×8): 1 mg via ORAL
  Filled 2017-07-29 (×8): qty 1

## 2017-07-29 MED ORDER — TRAZODONE HCL 50 MG PO TABS
150.0000 mg | ORAL_TABLET | Freq: Every day | ORAL | Status: DC
Start: 2017-07-29 — End: 2017-08-05
  Administered 2017-07-30 – 2017-08-04 (×7): 150 mg via ORAL
  Filled 2017-07-29 (×5): qty 3
  Filled 2017-07-29: qty 1
  Filled 2017-07-29: qty 3

## 2017-07-29 MED ORDER — HYDROCHLOROTHIAZIDE 25 MG PO TABS
25.0000 mg | ORAL_TABLET | Freq: Every day | ORAL | Status: DC
  Administered 2017-07-30 – 2017-08-02 (×4): 25 mg via ORAL
  Filled 2017-07-29 (×4): qty 1

## 2017-07-29 NOTE — ED Triage Notes (Signed)
Patient brought in by EMS for fall. No pain or LOC. Patients husband also states she is more altered than she was. Patient recently had her medications increased and has been altered ever since.

## 2017-07-29 NOTE — ED Provider Notes (Signed)
[facility]-EMERGENCY DEPT Provider Note   CSN: [id number] Arrival date & time: 07/29/17  1202     History   Chief Complaint Chief Complaint  Patient presents with  . Fall  . increased confusion    HPI Angela Fields is a 66 y.o. female with a history of dementia with behavioral disturbances, seizures, stroke, DM, HTN presents today for a possible fall at home.  Chart review shows that patient recently had changes made to her vimpat and lamotrigine Xr. According to chart review and notes made from when her husband called her neurologists patient fell twice this morning and fell asleep on the floor.  She reportedly has been lethargic.    She was discharged from [facility] in [place] on 07/18/17.   HPI  Past Medical History:  Diagnosis Date  . Dementia   . Diabetes mellitus without complication (HCC)   . Hyperlipidemia   . Hypertension   . Seizures (HCC)   . Stroke [facility])     Patient Active Problem List   Diagnosis Date Noted  . Cognitive and behavioral changes 06/26/2017  . Seizure (HCC) 06/19/2017  . Status epilepticus (HCC)   . Bradycardia   . Hypokalemia   . Hypertension 09/24/2016  . History of CVA (cerebrovascular accident) 09/24/2016  . 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 Hyperlipidemia 09/24/2016    History reviewed. No pertinent surgical history.  OB History    No data available       Home Medications    Prior to Admission medications   Medication Sig Start Date End Date Taking? Authorizing Provider  acetaminophen (TYLENOL 8 HOUR ARTHRITIS PAIN) 650 MG CR tablet Take 650 mg by mouth every 4 (four) hours as needed for pain.   Yes [provider]  clonazePAM (KLONOPIN) 1 MG tablet Take 0.5 mg by mouth 3 (three) times daily. 05/23/17  Yes [provider]  folic acid (FOLVITE) 1 MG tablet Take 1 mg by mouth daily.   Yes [provider]  lacosamide (VIMPAT) 200 MG  TABS tablet Take 1 tablet (200 mg total) by mouth 2 (two) times daily. 06/22/17  Yes Osei-Bonsu, Greggory Stallion, MD  lamoTRIgine (LAMICTAL) 200 MG tablet Take 1 tablet (200 mg total) by mouth 2 (two) times daily. 06/22/17  Yes Osei-Bonsu, Greggory Stallion, MD  metoprolol tartrate (LOPRESSOR) 25 MG tablet Take 25 mg by mouth 2 (two) times daily.   Yes [provider]  polyethylene glycol (MIRALAX / GLYCOLAX) packet Take 17 g by mouth daily.   Yes [provider]  potassium chloride (K-DUR) 10 MEQ tablet Take 10 mEq by mouth daily.   Yes [provider]  QUEtiapine (SEROQUEL XR) 300 MG 24 hr tablet Take 300 mg by mouth at bedtime.   Yes [provider]  sennosides-docusate sodium (SENOKOT-S) 8.6-50 MG tablet Take 2 tablets by mouth daily.   Yes [provider]  traZODone (DESYREL) 50 MG tablet Take 1 tablet (50 mg total) by mouth at bedtime. Patient taking differently: Take 150 mg by mouth at bedtime.  06/26/17  Yes Lord, Herminio Heads, NP  valsartan-hydrochlorothiazide (DIOVAN-HCT) 320-25 MG tablet Take 1 tablet by mouth daily.   Yes [provider]  white petrolatum ointment Apply 1 application topically 3 (three) times daily as needed for dry skin.   Yes [provider]  pravastatin (PRAVACHOL) 10 MG tablet Take 10 mg by mouth at bedtime.     [provider]  Family History History reviewed. No pertinent family history.  Social History Social History  Substance Use Topics  . Smoking status: Never Smoker  . Smokeless tobacco: Never Used  . Alcohol use No     Allergies   Patient has no known allergies.   Review of Systems Review of Systems  Unable to perform ROS: Mental status change     Physical Exam Updated Vital Signs BP (!) 143/77   Pulse 69   Temp 99.1 F (37.3 C) (Rectal)   Resp 16   Ht 5\' 5"  (1.651 m)   Wt 73 kg (161 lb)   SpO2 98%   BMI 26.79 kg/m   Physical Exam  Constitutional: She appears well-developed and  well-nourished. No distress.  HENT:  Head: Normocephalic and atraumatic.  Mouth/Throat: Oropharynx is clear and moist.  Eyes: Pupils are equal, round, and reactive to light. Conjunctivae are normal.  Neck: No JVD present.  Cardiovascular: Normal rate, regular rhythm, normal heart sounds and intact distal pulses.  Exam reveals no gallop and no friction rub.   No murmur heard. Pulmonary/Chest: Effort normal and breath sounds normal. No stridor. No respiratory distress.  Abdominal: Soft. There is no tenderness.  Musculoskeletal: She exhibits no edema.  All extremities are with out obvious deformities, crepitus, and compartments are soft.   Neurological: She is alert.  Oriented to person, not place or time.  Remainder of neruo exam limited secondary to patient's inability to follow commands.   Skin: Skin is warm and dry. She is not diaphoretic.  Psychiatric: She has a normal mood and affect.  Nursing note and vitals reviewed.    ED Treatments / Results  Labs (all labs ordered are listed, but only abnormal results are displayed) Labs Reviewed  COMPREHENSIVE METABOLIC PANEL - Abnormal; Notable for the following:       Result Value   Creatinine, Ser 1.24 (*)    GFR calc non Af Amer 44 (*)    GFR calc Af Amer 51 (*)    All other components within normal limits  URINALYSIS, ROUTINE W REFLEX MICROSCOPIC - Abnormal; Notable for the following:    Color, Urine STRAW (*)    All other components within normal limits  CBG MONITORING, ED - Abnormal; Notable for the following:    Glucose-Capillary 52 (*)    All other components within normal limits  CBG MONITORING, ED - Abnormal; Notable for the following:    Glucose-Capillary 59 (*)    All other components within normal limits  CBG MONITORING, ED - Abnormal; Notable for the following:    Glucose-Capillary 58 (*)    All other components within normal limits  CBG MONITORING, ED - Abnormal; Notable for the following:    Glucose-Capillary 52  (*)    All other components within normal limits  CBG MONITORING, ED - Abnormal; Notable for the following:    Glucose-Capillary 49 (*)    All other components within normal limits  CBC WITH DIFFERENTIAL/PLATELET  AMMONIA  I-STAT TROPONIN, ED  CBG MONITORING, ED  CBG MONITORING, ED  CBG MONITORING, ED  CBG MONITORING, ED  CBG MONITORING, ED  CBG MONITORING, ED  CBG MONITORING, ED  CBG MONITORING, ED    EKG  EKG Interpretation None       Radiology Dg Chest 2 View  Result Date: 07/29/2017 CLINICAL DATA:  Pain following fall EXAM: CHEST  2 VIEW COMPARISON:  None. FINDINGS: There is no edema or consolidation. Heart is upper normal in size with pulmonary  vascularity within normal limits. There is aortic atherosclerosis. No evident adenopathy. No bone lesions. IMPRESSION: Aortic atherosclerosis.  No edema or consolidation. Aortic Atherosclerosis (ICD10-I70.0). Electronically Signed   By: Bretta Bang III M.D.   On: 07/29/2017 14:19   Ct Head Wo Contrast  Result Date: 07/29/2017 CLINICAL DATA:  Fall, altered mental status, dementia EXAM: CT HEAD WITHOUT CONTRAST CT CERVICAL SPINE WITHOUT CONTRAST TECHNIQUE: Multidetector CT imaging of the head and cervical spine was performed following the standard protocol without intravenous contrast. Multiplanar CT image reconstructions of the cervical spine were also generated. COMPARISON:  CT head dated 06/25/2017 FINDINGS: CT HEAD FINDINGS Brain: No evidence of acute infarction, hemorrhage, hydrocephalus, extra-axial collection or mass lesion/mass effect. 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 Secondary ex vacuo dilatation of the occipital horn of the left lateral ventricle. Subcortical white matter and periventricular small vessel ischemic changes. 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 Skull: Normal. Negative for fracture or focal lesion. Sinuses/Orbits:  The visualized paranasal sinuses are essentially clear. The mastoid air cells are unopacified. Other: None. CT CERVICAL SPINE FINDINGS Alignment: Mild straightening of the cervical spine. Skull base and vertebrae: No acute fracture. No primary bone lesion or focal pathologic process. Soft tissues and spinal canal: No prevertebral fluid or swelling. No visible canal hematoma. Disc levels: Mild-to-moderate degenerative changes of the upper cervical spine. Spinal canal is patent. Upper chest: Visualized lung apices are clear. Other: Visualized thyroid is unremarkable. IMPRESSION: No evidence of acute intracranial abnormality. 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 No evidence of traumatic injury to the cervical spine. Mild to moderate degenerative changes. Electronically Signed   By: Charline Bills M.D.   On: 07/29/2017 14:06   Ct Cervical Spine Wo Contrast  Result Date: 07/29/2017 CLINICAL DATA:  Fall, altered mental status, dementia EXAM: CT HEAD WITHOUT CONTRAST CT CERVICAL SPINE WITHOUT CONTRAST TECHNIQUE: Multidetector CT imaging of the head and cervical spine was performed following the standard protocol without intravenous contrast. Multiplanar CT image reconstructions of the cervical spine were also generated. COMPARISON:  CT head dated 06/25/2017 FINDINGS: CT HEAD FINDINGS Brain: No evidence of acute infarction, hemorrhage, hydrocephalus, extra-axial collection or mass lesion/mass effect. 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 Secondary ex vacuo dilatation of the occipital horn of the left lateral ventricle. Subcortical white matter and periventricular small vessel ischemic changes. 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 Skull: Normal. Negative for fracture or focal lesion. Sinuses/Orbits: The visualized paranasal sinuses are  essentially clear. The mastoid air cells are unopacified. Other: None. CT CERVICAL SPINE FINDINGS Alignment: Mild straightening of the cervical spine. Skull base and vertebrae: No acute fracture. No primary bone lesion or focal pathologic process. Soft tissues and spinal canal: No prevertebral fluid or swelling. No visible canal hematoma. Disc levels: Mild-to-moderate degenerative changes of the upper cervical spine. Spinal canal is patent. Upper chest: Visualized lung apices are clear. Other: Visualized thyroid is unremarkable. IMPRESSION: No evidence of acute intracranial abnormality. 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 No evidence of traumatic injury to the cervical spine. Mild to moderate degenerative changes. Electronically Signed   By: Charline Bills M.D.   On: 07/29/2017 14:06    Procedures Procedures (including critical care time)  Medications Ordered in ED Medications  lacosamide (VIMPAT) tablet 150 mg (  not administered)  lamoTRIgine (LAMICTAL) tablet 200 mg (not administered)  metoprolol tartrate (LOPRESSOR) tablet 25 mg (not administered)  QUEtiapine (SEROQUEL XR) 24 hr tablet 300 mg (not administered)  traZODone (DESYREL) tablet 150 mg (not administered)  irbesartan (AVAPRO) tablet 300 mg (not administered)    And  hydrochlorothiazide (HYDRODIURIL) tablet 25 mg (not administered)  folic acid (FOLVITE) tablet 1 mg (not administered)  potassium chloride (K-DUR) CR tablet 10 mEq (not administered)     Initial Impression / Assessment and Plan / ED Course  I have reviewed the triage vital signs and the nursing notes.  Pertinent labs & imaging results that were available during my care of the patient were reviewed by me and considered in my medical decision making (see chart for details).  Clinical Course as of Jul 29 2300  Tue Jul 29, 2017  1323 Attempted to contact carl, spouse, no answer.    [EH]  1550 Checked on patient, she is very happy, smiling, drinking orange juice.    [EH]  1618 Spoke with Dr. Wilford Corner who instructed to decrease Vipmat to  BID and lamictil to a total of /day,  divided as BID or according to preference  [EH]  1703 Husband [name] reports :  Took medicines around 7, went back to sleep. Heard her fall.  And he was unable to get her up.  He feels like it is a safety issue at this point.  He reports that normally in the afternoon she will improve once the medications wear off.    [EH]  1907 Patient re-evaluated, she is resting comfortably, is confused.  I do not feel like patient can go home safely   [EH]  1948 Patient evaluated, she has eaten a [place] sandwich, soup, and is resting comfortably.   [EH]  2213 Spoke with RN regarding need for CBG.   [EH]  2223 Informed that CBG is 52.  Patient still appears to be at her normal baseline. She is awake, friendly and interacting.  Will treat her sugar with simple and complex carbohydrates and re-check in one hour.   [EH]    Clinical Course User Index [EH] Cristina Gong, PA-C   Philomena Doheny presents for evaluation from a fall. She has reportedly been more lethargic and sleepy after changes were made to her vipmatt and Lamictal.  She fell this morning and her husband feels like she is unable to be safe at home and expresses that she needs inpatient placement.  Patient was evaluated for her fall and any potential injuries.  CT head and neck with out acute abnormalities.  Labs appear at her baseline.  Ammonia was not elevated.  EKG obtained with out acute abnormalities. PT/OT consults were ordered along with social work and case management for evaluation and probable SNF placement.  These were unable to be obtained today.    Due to husband's suspicion that her falls and lethargy may be from changes to her medications neurology was consulted who gave instructions on decreasing her doses.  These changes were  made.   While in the ED patient was noted to be mildly hypoglycemic which was treated with oral intake.    At shift change care was transferred to S. Upstil PA-C who will follow pending studies, re-evaulate and determine disposition.     Final Clinical Impressions(s) / ED Diagnoses   Final diagnoses:  Fall, initial encounter    New Prescriptions New Prescriptions   No medications on file     Lyndel Safe  W, PA-C 07/29/17 1610    Derwood Kaplan, MD 07/30/17 [phone]

## 2017-07-29 NOTE — ED Notes (Signed)
Bed: WA29 Expected date:  Expected time:  Means of arrival:  Comments: EMS-altered

## 2017-07-29 NOTE — ED Notes (Signed)
Report given to RN

## 2017-07-29 NOTE — Care Management Note (Signed)
Case Management Note  Patient Details  Name: Angela Fields MRN: [id number] Date of Birth: [birth year]  Subjective/Objective:                  66 y.o. female with a history of dementia with behavioral disturbances, seizures, stroke, DM, HTN presents today for a possible fall at home  Action/Plan: CM consulted for SNF placement.  Discussed with Jeraldine Loots, [place] the possibility of HHS and was advised her plan was SNF placement as pt was not safe for D/C home.  Advised to place PT/OT consults and CSW.  CM was unable to get through to the PT/OT office due to the hour, found a PT person in their office who advised they would attempt to see the pt tonight.  CM contacted CSW and advised of EDP plan.  CM will continue to follow.  Expected Discharge Date:   Unknown               Expected Discharge Plan:  Skilled Nursing Facility  In-House Referral:  Clinical Social Work  Discharge planning Services  CM Consult  Status of Service:  In process, will continue to follow  Rica Koyanagi, RN 07/29/2017, 5:47 PM

## 2017-07-29 NOTE — ED Notes (Signed)
Patient denies pain and is resting comfortably.  

## 2017-07-29 NOTE — ED Notes (Addendum)
Patient able to provide name and dob. Unable to verify place and time. When asked what month she states "month who" when asked the year she states "1650". Patient will follow command. She ask for "for the bag of doritos she was just eating and keeps repeating 1650". She also keeps asking me am I here any kin to her because her momma sent her here to pick me up. When attempting to orient patient she looks at me with a blank stare and begins laughing and then lays her head back on pillow and closes eyes. Denies any pain when asked.

## 2017-07-29 NOTE — ED Notes (Signed)
Bed: QM57 Expected date:  Expected time:  Means of arrival:  Comments: EMS-hold for [id number]

## 2017-07-29 NOTE — ED Provider Notes (Signed)
Weakness Consult by SW in am for placement? Change to meds recently with lethargy and increased falling (lamictal, vimpat). Dose changes recommended tonight by her doctor.   Demented at baseline. Now at normal baseline mentation Hypoglycemia here, 50's and 60's  Plan: SW consult in am for PT, OT, eval home situation and ?start process for placement.   Monitor CBG every hour for further hypoglycemia.  Patient's CBG after eating and drinking juice is 49. IV re-started, amp D50 given. Will continue to monitor CBG's q 1 hour. Feel that she will need to be admitted for further observation of drops in blood sugar. SW should be involved with the patient regarding placement as well.    Elpidio Anis, PA-C 07/30/17 [id number]    Derwood Kaplan, MD 07/30/17 [phone]

## 2017-07-29 NOTE — ED Notes (Signed)
Orange juice was given to pt. When CBG was in the range of 52. Nurses aware.

## 2017-07-30 ENCOUNTER — Encounter (HOSPITAL_COMMUNITY): Payer: Self-pay | Admitting: Internal Medicine

## 2017-07-30 DIAGNOSIS — E162 Hypoglycemia, unspecified: Secondary | ICD-10-CM | POA: Diagnosis present

## 2017-07-30 LAB — CBG MONITORING, ED
GLUCOSE-CAPILLARY: 114 mg/dL — AB (ref 65–99)
Glucose-Capillary: 106 mg/dL — ABNORMAL HIGH (ref 65–99)
Glucose-Capillary: 109 mg/dL — ABNORMAL HIGH (ref 65–99)
Glucose-Capillary: 97 mg/dL (ref 65–99)

## 2017-07-30 LAB — GLUCOSE, CAPILLARY
GLUCOSE-CAPILLARY: 86 mg/dL (ref 65–99)
GLUCOSE-CAPILLARY: 77 mg/dL (ref 65–99)
GLUCOSE-CAPILLARY: 90 mg/dL (ref 65–99)
Glucose-Capillary: 72 mg/dL (ref 65–99)
Glucose-Capillary: 75 mg/dL (ref 65–99)
Glucose-Capillary: 69 mg/dL (ref 65–99)

## 2017-07-30 MED ORDER — CLONAZEPAM 0.5 MG PO TABS
0.5000 mg | ORAL_TABLET | Freq: Three times a day (TID) | ORAL | Status: DC
  Administered 2017-07-30 – 2017-08-05 (×19): 0.5 mg via ORAL
  Filled 2017-07-30 (×19): qty 1

## 2017-07-30 MED ORDER — DEXTROSE 10 % IV SOLN
INTRAVENOUS | Status: DC
  Administered 2017-07-30 – 2017-08-01 (×5): via INTRAVENOUS
  Filled 2017-07-30 (×6): qty 1000

## 2017-07-30 MED ORDER — POLYETHYLENE GLYCOL 3350 17 G PO PACK
17.0000 g | PACK | Freq: Every day | ORAL | Status: DC
  Administered 2017-07-30 – 2017-08-05 (×7): 17 g via ORAL
  Filled 2017-07-30 (×7): qty 1

## 2017-07-30 MED ORDER — ACETAMINOPHEN 325 MG PO TABS
650.0000 mg | ORAL_TABLET | Freq: Four times a day (QID) | ORAL | Status: DC | PRN
  Administered 2017-08-03: 650 mg via ORAL
  Filled 2017-07-30: qty 2

## 2017-07-30 MED ORDER — ACETAMINOPHEN 650 MG RE SUPP: 650.0000 mg | Freq: Four times a day (QID) | RECTAL | Status: DC | PRN

## 2017-07-30 MED ORDER — SENNOSIDES-DOCUSATE SODIUM 8.6-50 MG PO TABS
2.0000 | ORAL_TABLET | Freq: Every day | ORAL | Status: DC
  Administered 2017-07-30 – 2017-08-05 (×7): 2 via ORAL
  Filled 2017-07-30 (×8): qty 2

## 2017-07-30 MED ORDER — HEPARIN SODIUM (PORCINE) 5000 UNIT/ML IJ SOLN
5000.0000 [IU] | Freq: Three times a day (TID) | INTRAMUSCULAR | Status: DC
  Administered 2017-07-30 – 2017-08-05 (×18): 5000 [IU] via SUBCUTANEOUS
  Filled 2017-07-30 (×19): qty 1

## 2017-07-30 NOTE — ED Notes (Signed)
ED TO INPATIENT HANDOFF REPORT  Name/Age/Gender Angela Fields 66 y.o. female  Code Status    Code Status Orders        Start     Ordered   07/30/17 1038  Full code  Continuous     07/30/17 1037    Code Status History    Date Active Date Inactive Code Status Order ID Comments User Context   06/25/2017  7:16 PM 06/30/2017  5:49 PM Full Code [id number]  Isla Pence, MD ED   06/19/2017  7:59 AM 06/22/2017 10:09 PM Full Code [id number]  Rosita Fire, MD ED      Home/SNF/Other Home  Chief Complaint altered mental status  Level of Care/Admitting Diagnosis ED Disposition    ED Disposition Condition [place]: [facility] [[id number]]  Level of Care: Telemetry [5]  Admit to tele based on following criteria: Monitor for Ischemic changes  Diagnosis: Hypoglycemia [242204]  Admitting Physician: Reubin Milan [[id number]]  Attending Physician: Reubin Milan [[id number]]  PT Class (Do Not Modify): Observation [104]  PT Acc Code (Do Not Modify): Observation [10022]       Medical History Past Medical History:  Diagnosis Date  . Dementia   . Diabetes mellitus without complication ([place])   . Hyperlipidemia   . Hypertension   . Seizures ([place])   . Stroke [facility])     Allergies No Known Allergies  IV Location/Drains/Wounds Patient Lines/Drains/Airways Status   Active Line/Drains/Airways    Name:   Placement date:   Placement time:   Site:   Days:   Peripheral IV 07/29/17 Right Forearm  07/29/17    2349    Forearm    1          Labs/Imaging Results for orders placed or performed during the hospital encounter of 07/29/17 (from the past 48 hour(s))  Urinalysis, Routine w reflex microscopic     Status: Abnormal   Collection Time: 07/29/17  1:47 PM  Result Value Ref Range   Color, Urine STRAW (A) YELLOW   APPearance CLEAR CLEAR   Specific Gravity, Urine 1.005 1.005 - 1.030   pH 7.0 5.0 - 8.0   Glucose, UA NEGATIVE NEGATIVE  mg/dL   Hgb urine dipstick NEGATIVE NEGATIVE   Bilirubin Urine NEGATIVE NEGATIVE   Ketones, ur NEGATIVE NEGATIVE mg/dL   Protein, ur NEGATIVE NEGATIVE mg/dL   Nitrite NEGATIVE NEGATIVE   Leukocytes, UA NEGATIVE NEGATIVE  Comprehensive metabolic panel     Status: Abnormal   Collection Time: 07/29/17  2:32 PM  Result Value Ref Range   Sodium 141 135 - 145 mmol/L   Potassium 3.7 3.5 - 5.1 mmol/L   Chloride 102 101 - 111 mmol/L   CO2 30 22 - 32 mmol/L   Glucose, Bld 84 65 - 99 mg/dL   BUN 9 6 - 20 mg/dL   Creatinine, Ser 1.24 (H) 0.44 - 1.00 mg/dL   Calcium 9.1 8.9 - 10.3 mg/dL   Total Protein 7.7 6.5 - 8.1 g/dL   Albumin 4.3 3.5 - 5.0 g/dL   AST 19 15 - 41 U/L   ALT 19 14 - 54 U/L   Alkaline Phosphatase 96 38 - 126 U/L   Total Bilirubin 0.4 0.3 - 1.2 mg/dL   GFR calc non Af Amer 44 (L) >60 mL/min   GFR calc Af Amer 51 (L) >60 mL/min    Comment: (NOTE) The eGFR has been calculated using the CKD EPI equation. This  calculation has not been validated in all clinical situations. eGFR's persistently <60 mL/min signify possible Chronic Kidney Disease.    Anion gap 9 5 - 15  CBC with Differential     Status: None   Collection Time: 07/29/17  2:32 PM  Result Value Ref Range   WBC 5.6 4.0 - 10.5 K/uL   RBC 4.10 3.87 - 5.11 MIL/uL   Hemoglobin 12.4 12.0 - 15.0 g/dL   HCT 37.8 36.0 - 46.0 %   MCV 92.2 78.0 - 100.0 fL   MCH 30.2 26.0 - 34.0 pg   MCHC 32.8 30.0 - 36.0 g/dL   RDW 14.3 11.5 - 15.5 %   Platelets 268 150 - 400 K/uL   Neutrophils Relative % 65 %   Neutro Abs 3.7 1.7 - 7.7 K/uL   Lymphocytes Relative 27 %   Lymphs Abs 1.5 0.7 - 4.0 K/uL   Monocytes Relative 5 %   Monocytes Absolute 0.3 0.1 - 1.0 K/uL   Eosinophils Relative 3 %   Eosinophils Absolute 0.2 0.0 - 0.7 K/uL   Basophils Relative 0 %   Basophils Absolute 0.0 0.0 - 0.1 K/uL  Ammonia     Status: None   Collection Time: 07/29/17  2:32 PM  Result Value Ref Range   Ammonia 11 9 - 35 umol/L  I-stat troponin, ED      Status: None   Collection Time: 07/29/17  2:44 PM  Result Value Ref Range   Troponin i, poc 0.00 0.00 - 0.08 ng/mL   Comment 3            Comment: Due to the release kinetics of cTnI, a negative result within the first hours of the onset of symptoms does not rule out myocardial infarction with certainty. If myocardial infarction is still suspected, repeat the test at appropriate intervals.   CBG monitoring, ED     Status: Abnormal   Collection Time: 07/29/17  3:43 PM  Result Value Ref Range   Glucose-Capillary 52 (L) 65 - 99 mg/dL  CBG monitoring, ED     Status: None   Collection Time: 07/29/17  4:48 PM  Result Value Ref Range   Glucose-Capillary 75 65 - 99 mg/dL  CBG monitoring, ED     Status: Abnormal   Collection Time: 07/29/17  6:01 PM  Result Value Ref Range   Glucose-Capillary 59 (L) 65 - 99 mg/dL  POC CBG, ED     Status: Abnormal   Collection Time: 07/29/17  7:27 PM  Result Value Ref Range   Glucose-Capillary 58 (L) 65 - 99 mg/dL  CBG monitoring, ED     Status: Abnormal   Collection Time: 07/29/17 10:19 PM  Result Value Ref Range   Glucose-Capillary 52 (L) 65 - 99 mg/dL   Comment 1 Notify RN    Comment 2 Document in Chart   CBG monitoring, ED     Status: Abnormal   Collection Time: 07/29/17 10:56 PM  Result Value Ref Range   Glucose-Capillary 49 (L) 65 - 99 mg/dL   Comment 1 Notify RN    Comment 2 Document in Chart   CBG monitoring, ED     Status: None   Collection Time: 07/29/17 11:55 PM  Result Value Ref Range   Glucose-Capillary 96 65 - 99 mg/dL  CBG monitoring, ED     Status: Abnormal   Collection Time: 07/30/17  1:34 AM  Result Value Ref Range   Glucose-Capillary 106 (H) 65 - 99 mg/dL  CBG  monitoring, ED     Status: Abnormal   Collection Time: 07/30/17  2:59 AM  Result Value Ref Range   Glucose-Capillary 114 (H) 65 - 99 mg/dL  CBG monitoring, ED     Status: Abnormal   Collection Time: 07/30/17  4:00 AM  Result Value Ref Range   Glucose-Capillary  109 (H) 65 - 99 mg/dL   Dg Chest 2 View  Result Date: 07/29/2017 CLINICAL DATA:  Pain following fall EXAM: CHEST  2 VIEW COMPARISON:  None. FINDINGS: There is no edema or consolidation. Heart is upper normal in size with pulmonary vascularity within normal limits. There is aortic atherosclerosis. No evident adenopathy. No bone lesions. IMPRESSION: Aortic atherosclerosis.  No edema or consolidation. Aortic Atherosclerosis (ICD10-I70.0). Electronically Signed   By: Lowella Grip III M.D.   On: 07/29/2017 14:19   Ct Head Wo Contrast  Result Date: 07/29/2017 CLINICAL DATA:  Fall, altered mental status, dementia EXAM: CT HEAD WITHOUT CONTRAST CT CERVICAL SPINE WITHOUT CONTRAST TECHNIQUE: Multidetector CT imaging of the head and cervical spine was performed following the standard protocol without intravenous contrast. Multiplanar CT image reconstructions of the cervical spine were also generated. COMPARISON:  CT head dated 06/25/2017 FINDINGS: CT HEAD FINDINGS Brain: No evidence of acute infarction, hemorrhage, hydrocephalus, extra-axial collection or mass lesion/mass effect. 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 Secondary ex vacuo dilatation of the occipital horn of the left lateral ventricle. Subcortical white matter and periventricular small vessel ischemic changes. 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 Skull: Normal. Negative for fracture or focal lesion. Sinuses/Orbits: The visualized paranasal sinuses are essentially clear. The mastoid air cells are unopacified. Other: None. CT CERVICAL SPINE FINDINGS Alignment: Mild straightening of the cervical spine. Skull base and vertebrae: No acute fracture. No primary bone lesion or focal pathologic process. Soft tissues and spinal canal: No prevertebral fluid or swelling. No visible canal hematoma. Disc levels: Mild-to-moderate degenerative changes of the upper cervical  spine. Spinal canal is patent. Upper chest: Visualized lung apices are clear. Other: Visualized thyroid is unremarkable. IMPRESSION: No evidence of acute intracranial abnormality. 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 No evidence of traumatic injury to the cervical spine. Mild to moderate degenerative changes. Electronically Signed   By: Julian Hy M.D.   On: 07/29/2017 14:06   Ct Cervical Spine Wo Contrast  Result Date: 07/29/2017 CLINICAL DATA:  Fall, altered mental status, dementia EXAM: CT HEAD WITHOUT CONTRAST CT CERVICAL SPINE WITHOUT CONTRAST TECHNIQUE: Multidetector CT imaging of the head and cervical spine was performed following the standard protocol without intravenous contrast. Multiplanar CT image reconstructions of the cervical spine were also generated. COMPARISON:  CT head dated 06/25/2017 FINDINGS: CT HEAD FINDINGS Brain: No evidence of acute infarction, hemorrhage, hydrocephalus, extra-axial collection or mass lesion/mass effect. 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 Secondary ex vacuo dilatation of the occipital horn of the left lateral ventricle. Subcortical white matter and periventricular small vessel ischemic changes. 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 Skull: Normal. Negative for fracture or focal lesion. Sinuses/Orbits: The visualized paranasal sinuses are essentially clear. The mastoid air cells are unopacified. Other: None. CT CERVICAL SPINE FINDINGS Alignment: Mild straightening of the cervical spine. Skull base and vertebrae: No acute fracture. No primary bone lesion or focal pathologic process. Soft tissues and spinal canal: No prevertebral fluid or swelling. No  visible canal hematoma. Disc levels: Mild-to-moderate degenerative changes of the upper cervical spine. Spinal canal is patent. Upper  chest: Visualized lung apices are clear. Other: Visualized thyroid is unremarkable. IMPRESSION: No evidence of acute intracranial abnormality. 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 No evidence of traumatic injury to the cervical spine. Mild to moderate degenerative changes. Electronically Signed   By: Julian Hy M.D.   On: 07/29/2017 14:06    Pending Labs Unresulted Labs    Start     Ordered   07/31/17 0500  Hemoglobin A1c  Tomorrow morning,   R     07/30/17 1034   07/31/17 7062  Basic metabolic panel  Tomorrow morning,   R     07/30/17 1034      Vitals/Pain Today's Vitals   07/30/17 0700 07/30/17 0730 07/30/17 0900 07/30/17 1130  BP: (!) 113/59 133/70 132/66 124/70  Pulse: (!) 57 (!) 54 (!) 58 60  Resp: 19 (!) 22 15 (!) 21  Temp:      TempSrc:      SpO2: 96% 95% 98% 98%  Weight:      Height:      PainSc:        Isolation Precautions No active isolations  Medications Medications  lacosamide (VIMPAT) tablet 150 mg (150 mg Oral Given 07/30/17 0913)  lamoTRIgine (LAMICTAL) tablet 200 mg (200 mg Oral Given 07/30/17 0913)  metoprolol tartrate (LOPRESSOR) tablet 25 mg (25 mg Oral Given 07/30/17 0913)  QUEtiapine (SEROQUEL XR) 24 hr tablet 300 mg (300 mg Oral Given 07/30/17 0050)  traZODone (DESYREL) tablet 150 mg (150 mg Oral Given 07/30/17 0050)  irbesartan (AVAPRO) tablet 300 mg (300 mg Oral Given 07/30/17 0926)    And  hydrochlorothiazide (HYDRODIURIL) tablet 25 mg (25 mg Oral Given 37/62/83 1517)  folic acid (FOLVITE) tablet 1 mg (1 mg Oral Given 07/30/17 0913)  potassium chloride (K-DUR) CR tablet 10 mEq (10 mEq Oral Given 07/30/17 0913)  dextrose 10 % infusion ( Intravenous New Bag/Given 07/30/17 0139)  polyethylene glycol (MIRALAX / GLYCOLAX) packet 17 g (not administered)  sennosides-docusate sodium (SENOKOT-S) 8.6-50 MG tablet 2 tablet (not administered)  clonazePAM (KLONOPIN)  tablet 0.5 mg (not administered)  heparin injection 5,000 Units (not administered)  acetaminophen (TYLENOL) tablet 650 mg (not administered)    Or  acetaminophen (TYLENOL) suppository 650 mg (not administered)  dextrose 50 % solution 25 mL (25 mLs Intravenous Given 07/29/17 2347)    Mobility non-ambulatory

## 2017-07-30 NOTE — Evaluation (Signed)
Occupational Therapy Evaluation Patient Details Name: Angela DohenyCarolyn Eley MRN: [id number] DOB: Jul 04, 1951 Today's Date: 07/30/2017    History of Present Illness Angela DohenyCarolyn Suski is a 66 y.o. female with medical history significant of a stroke with aphasia and right hemianopia, hypertension, hyperlipidemia, seizure disorder admitted through ED s/p falls and with AMS.   Clinical Impression   Pt was admitted for the above.  Unsure of PLOF.  Pt needs min A for balance for adls and toilet transfers.  Additionally she needed min A for grooming to locate objects and apply toothpaste.      Follow Up Recommendations  Supervision/Assistance - 24 hour;Home health OT;SNF    Equipment Recommendations   (defer to next venue)    Recommendations for Other Services       Precautions / Restrictions Precautions Precautions: Fall Restrictions Weight Bearing Restrictions: No      Mobility Bed Mobility         Supine to sit: Supervision Sit to supine: Supervision   General bed mobility comments: multimodal cues  Transfers   Equipment used: None   Sit to Stand: Min assist         General transfer comment: steadying assistance    Balance           Standing balance support: During functional activity Standing balance-Leahy Scale: Fair                             ADL either performed or assessed with clinical judgement   ADL Overall ADL's : Needs assistance/impaired Eating/Feeding: Set up;Supervision/ safety   Grooming: Oral care;Minimal assistance;Wash/dry hands;Standing   Upper Body Bathing: Minimal assistance;Sitting   Lower Body Bathing: Minimal assistance;Sit to/from stand   Upper Body Dressing : Minimal assistance;Sitting   Lower Body Dressing: Sit to/from stand;Minimal assistance   Toilet Transfer: Minimal assistance;Ambulation;Comfort height toilet   Toileting- Clothing Manipulation and Hygiene: Min guard;Sitting/lateral lean         General ADL  Comments: ambulated to bathroom and performed toileting and washed hands.  Had stood at sink in room prior to this and washed face and brushed teeth.  Pt has hemianopsia and had difficulty finding objects. Also she needed assistance to put toothpaste on toothbrush:  she started to apply to handle of toothbrush.  Unsteady walking to bathroom     Vision   Additional Comments: unsure of vision.  H/O R hemi     Perception Perception Comments: difficulty aligning toothpaste on brush   Praxis Praxis Praxis-Other Comments: wfls for adls    Pertinent Vitals/Pain Pain Assessment: No/denies pain     Hand Dominance     Extremity/Trunk Assessment Upper Extremity Assessment Upper Extremity Assessment: Overall WFL for tasks assessed           Communication Communication Communication: Receptive difficulties;Expressive difficulties (multimodal cues at times)   Cognition Arousal/Alertness: Awake/alert Behavior During Therapy: Impulsive Overall Cognitive Status: History of cognitive impairments - at baseline                                     General Comments       Exercises     Shoulder Instructions      Home Living Family/patient expects to be discharged to:: Unsure Living Arrangements: Spouse/significant other  Additional Comments: Due to aphasia, pt was unable to clarify home living 100%.  It seems she lives with husband in 1 level home without steps and that she has a walker but does not use it.      Prior Functioning/Environment          Comments: unsure        OT Problem List: Decreased strength;Decreased activity tolerance;Impaired balance (sitting and/or standing);Impaired vision/perception;Decreased knowledge of use of DME or AE;Pain;Decreased safety awareness;Decreased cognition (decreased communication)      OT Treatment/Interventions: Self-care/ADL training;DME and/or AE instruction;Therapeutic  activities;Cognitive remediation/compensation;Visual/perceptual remediation/compensation;Balance training;Patient/family education    OT Goals(Current goals can be found in the care plan section) Acute Rehab OT Goals Patient Stated Goal: No goals stated OT Goal Formulation:  (pt generally agreeable to plan) Time For Goal Achievement: 08/06/17 Potential to Achieve Goals: Good ADL Goals Pt Will Transfer to Toilet: with min guard assist;bedside commode;ambulating;regular height toilet (vs) Additional ADL Goal #1: pt will complete adl with min guard and multimodal cues, sit to sta  OT Frequency: Min 2X/week   Barriers to D/C:            Co-evaluation              AM-PAC PT "6 Clicks" Daily Activity     Outcome Measure Help from another person eating meals?: A Little Help from another person taking care of personal grooming?: A Little Help from another person toileting, which includes using toliet, bedpan, or urinal?: A Little Help from another person bathing (including washing, rinsing, drying)?: A Little Help from another person to put on and taking off regular upper body clothing?: A Little Help from another person to put on and taking off regular lower body clothing?: A Little 6 Click Score: 18   End of Session    Activity Tolerance: Patient tolerated treatment well Patient left: in bed;with call bell/phone within reach;with bed alarm set  OT Visit Diagnosis: Unsteadiness on feet (R26.81)                Time: 5409-8119 OT Time Calculation (min): 38 min Charges:  OT General Charges $OT Visit: 1 Visit OT Evaluation $OT Eval Moderate Complexity: 1 Mod OT Treatments $Self Care/Home Management : 8-22 mins G-Codes: OT G-codes **NOT FOR INPATIENT CLASS** Functional Assessment Tool Used: Clinical judgement Functional Limitation: Self care Self Care Current Status (J4782): At least 20 percent but less than 40 percent impaired, limited or restricted Self Care Goal Status  (N5621): At least 1 percent but less than 20 percent impaired, limited or restricted   Marica Otter, OTR/L [phone] 07/30/2017  Joycelin Radloff 07/30/2017, 2:06 PM

## 2017-07-30 NOTE — Clinical Social Work Note (Signed)
Clinical Social Work Assessment  Patient Details  Name: Angela DohenyCarolyn Godbee MRN: [id number] Date of Birth: Mar 02, 1951  Date of referral:  07/30/17               Reason for consult:  Facility Placement                Permission sought to share information with:    Permission granted to share information::     Name::        Agency::     Relationship::     Contact Information:     Housing/Transportation Living arrangements for the past 2 months:  Apartment Source of Information:  Spouse (Spouse- Materials engineerCarl ) Patient Interpreter Needed:  None Criminal Activity/Legal Involvement Pertinent to Current Situation/Hospitalization:    Significant Relationships:  Spouse Lives with:  Spouse Do you feel safe going back to the place where you live?  No Need for family participation in patient care:  Yes (Comment)  Care giving concerns: Spouse, Baldo AshCarl, thinks patient would benefit from SNF placement at this time.    Social Worker assessment / plan: CSW spoke with patients spouse, Baldo AshCarl, regarding disposition planning. PT has recommended SNF at this time and spouse is agreeable to this discharge plan. Per spouse, patient has never been to a skilled nursing facility in the past however did mention patient [facility]. Spouse is not familiar with skilled nursing facilities in the area and is open to any offers. CSW will complete FL[facility]    Employment status:  Retired Database administratornsurance information:  Managed Medicare PT Recommendations:  Skilled Nursing Facility Information / Referral to community resources:  Skilled Nursing Facility  Patient/Family's Response to care: Spouse appreciated CSW.   Patient/Family's Understanding of and Emotional Response to Diagnosis, Current Treatment, and Prognosis:  Unknown.   Emotional Assessment Appearance:  Appears stated age Attitude/Demeanor/Rapport:    Affect (typically observed):  Unable to  Assess Orientation:  Oriented to Self Alcohol / Substance use:    Psych involvement (Current and /or in the community):  No (Comment)  Discharge Needs  Concerns to be addressed:  No discharge needs identified Readmission within the last 30 days:  No Current discharge risk:  None Barriers to Discharge:  No Barriers Identified   Donnie Coffinrin M Huxton Glaus, LCSW 07/30/2017, 1:31 PM

## 2017-07-30 NOTE — [place] FL2 (Signed)
[place] MEDICAID FL2 LEVEL OF CARE SCREENING TOOL     IDENTIFICATION  Patient Name: Angela Fields Birthdate: 10-Dec-1950 Sex: female Admission Date (Current Location): 07/29/2017  [facility] [place]   Facility and Address:  [facility],  [address], [place] [id number]      Provider Number: [id number]  Attending Physician Name and Address:  Osvaldo ShipperKrishnan, Gokul, MD  Relative Name and Phone Number:       Current Level of Care: Hospital Recommended Level of Care: Skilled Nursing Facility Prior Approval Number:    Date Approved/Denied:   PASRR Number:    Discharge Plan: SNF    Current Diagnoses: Patient Active Problem List   Diagnosis Date Noted  . Hypoglycemia 07/30/2017  . Cognitive and behavioral changes 06/26/2017  . Seizure (HCC) 06/19/2017  . Status epilepticus (HCC)   . Bradycardia   . Hypokalemia   . Hypertension 09/24/2016  . History of CVA (cerebrovascular accident) 09/24/2016  . 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 Hyperlipidemia 09/24/2016    Orientation RESPIRATION BLADDER Height & Weight     Self  Normal Continent Weight: 161 lb (73 kg) Height:  5\' 5"  (165.1 cm)  BEHAVIORAL SYMPTOMS/MOOD NEUROLOGICAL BOWEL NUTRITION STATUS      Continent    AMBULATORY STATUS COMMUNICATION OF NEEDS Skin   Limited Assist Verbally Normal                       Personal Care Assistance Level of Assistance  Bathing, Feeding, Dressing Bathing Assistance: Limited assistance Feeding assistance: Independent Dressing Assistance: Limited assistance     Functional Limitations Info             SPECIAL CARE FACTORS FREQUENCY  PT (By licensed PT), OT (By licensed OT)     PT Frequency: 5 OT Frequency: 5            Contractures      Additional Factors Info  Allergies, Code Status Code Status Info: Full Code  Allergies Info: NKA            Current Medications (07/30/2017):  This is  the current hospital active medication list Current Facility-Administered Medications  Medication Dose Route Frequency Provider Last Rate Last Dose  . acetaminophen (TYLENOL) tablet 650 mg  650 mg Oral Q6H PRN Bobette Mortiz, David Manuel, MD       Or  . acetaminophen (TYLENOL) suppository 650 mg  650 mg Rectal Q6H PRN Bobette Mortiz, David Manuel, MD      . clonazePAM Scarlette Calico(KLONOPIN) tablet 0.5 mg  0.5 mg Oral TID Bobette Mortiz, David Manuel, MD      . dextrose 10 % infusion   Intravenous Continuous Bobette Mortiz, David Manuel, MD 75 mL/hr at 07/30/17 0139    . folic acid (FOLVITE) tablet 1 mg  1 mg Oral Daily Cristina GongHammond, Elizabeth W, PA-C   1 mg at 07/30/17 [id number]  . heparin injection 5,000 Units  5,000 Units Subcutaneous Q8H Bobette Mortiz, David Manuel, MD      . irbesartan Evlyn Kanner(AVAPRO) tablet 300 mg  300 mg Oral Daily Cristina GongHammond, Elizabeth W, [place]   300 mg at 07/30/17 [id number]   And  . hydrochlorothiazide (HYDRODIURIL) tablet 25 mg  25 mg Oral Daily Cristina GongHammond, Elizabeth W, [place]   25 mg at 07/30/17 [id number]  . lacosamide (VIMPAT) tablet 150 mg  150 mg Oral BID Cristina GongHammond, Elizabeth W, PA-C   150 mg at 07/30/17 0913  . lamoTRIgine (LAMICTAL) tablet 200 mg  200 mg Oral BID Cristina Gong, PA-C   200 mg at 07/30/17 4098  . metoprolol tartrate (LOPRESSOR) tablet 25 mg  25 mg Oral BID Cristina Gong, PA-C   25 mg at 07/30/17 0913  . polyethylene glycol (MIRALAX / GLYCOLAX) packet 17 g  17 g Oral Daily Bobette Mo, MD      . potassium chloride (K-DUR) CR tablet 10 mEq  10 mEq Oral Daily Cristina Gong, [place]   10 mEq at 07/30/17 0913  . QUEtiapine (SEROQUEL XR) 24 hr tablet 300 mg  300 mg Oral QHS Cristina Gong, PA-C   300 mg at 07/30/17 0050  . senna-docusate (Senokot-S) tablet 2 tablet  2 tablet Oral Daily Bobette Mo, MD      . traZODone (DESYREL) tablet 150 mg  150 mg Oral QHS Cristina Gong, [place]   150 mg at 07/30/17 1191     Discharge Medications: Please see discharge summary for a list of discharge  medications.  Relevant Imaging Results:  Relevant Lab Results:   Additional Information    Donnie Coffin, LCSW

## 2017-07-30 NOTE — ED Notes (Addendum)
Patient given breakfast tray. Currently eating.

## 2017-07-30 NOTE — Progress Notes (Signed)
[facility] PROGRESS NOTE  Angela Fields ZOX:[id number] DOB: [birth year] DOA: 07/29/2017  PCP: Caffie Damme, MD  Brief History/Interval Summary: 66 year old African-American female with a past medical history dementia, type 2 diabetes, hyperlipidemia, hypertension, seizures, previous stroke who presented after having 2 falls at home. Patient had been more drowsy and somnolent in the last few weeks. According to family her antiepileptic medication including Lamictal and Vimpat doses were increased recently. Patient wears also found to have hypoglycemia. She required D10 infusion.  Reason for Visit: hypoglycemia  Consultants: none  Procedures: none  Antibiotics: none  Subjective/Interval History: Patient is a poor historian due to her dementia. She denies any pain issues currently. She is having her breakfast.  ROS: denies any nausea or vomiting  Objective:  Vital Signs  Vitals:   07/30/17 0600 07/30/17 0700 07/30/17 0730 07/30/17 0900  BP: 133/65 (!) 113/59 133/70 132/66  Pulse: (!) 56 (!) 57 (!) 54 (!) 58  Resp: 19 19 (!) 22 15  Temp:      TempSrc:      SpO2: 97% 96% 95% 98%  Weight:      Height:       No intake or output data in the 24 hours ending 07/30/17 1040 Filed Weights   07/29/17 1214  Weight: 73 kg (161 lb)    General appearance: alert, cooperative, distracted and no distress Head: Normocephalic, without obvious abnormality, atraumatic Resp: clear to auscultation bilaterally Cardio: regular rate and rhythm, S1, S2 normal, no murmur, click, rub or gallop GI: soft, non-tender; bowel sounds normal; no masses,  no organomegaly Extremities: extremities normal, atraumatic, no cyanosis or edema Neurologic: no obvious focal neurological deficits noted. She is disoriented due to her history of dementia  Lab Results:  Data Reviewed: I have personally reviewed following labs and imaging studies  CBC:  Recent Labs Lab 07/29/17 1432  WBC 5.6    NEUTROABS 3.7  HGB 12.4  HCT 37.8  MCV 92.2  PLT 268    Basic Metabolic Panel:  Recent Labs Lab 07/29/17 1432  NA 141  K 3.7  CL 102  CO2 30  GLUCOSE 84  BUN 9  CREATININE 1.24*  CALCIUM 9.1    GFR: Estimated Creatinine Clearance: 43.5 mL/min (A) (by C-G formula based on SCr of 1.24 mg/dL (H)).  Liver Function Tests:  Recent Labs Lab 07/29/17 1432  AST 19  ALT 19  ALKPHOS 96  BILITOT 0.4  PROT 7.7  ALBUMIN 4.3    Recent Labs Lab 07/29/17 1432  AMMONIA 11    CBG:  Recent Labs Lab 07/29/17 2256 07/29/17 2355 07/30/17 0134 07/30/17 0259 07/30/17 0400  GLUCAP 49* 96 106* 114* 109*             Radiology Studies: Dg Chest 2 View  Result Date: 07/29/2017 CLINICAL DATA:  Pain following fall EXAM: CHEST  2 VIEW COMPARISON:  None. FINDINGS: There is no edema or consolidation. Heart is upper normal in size with pulmonary vascularity within normal limits. There is aortic atherosclerosis. No evident adenopathy. No bone lesions. IMPRESSION: Aortic atherosclerosis.  No edema or consolidation. Aortic Atherosclerosis (ICD10-I70.0). Electronically Signed   By: Bretta Bang III M.D.   On: 07/29/2017 14:19   Ct Head Wo Contrast  Result Date: 07/29/2017 CLINICAL DATA:  Fall, altered mental status, dementia EXAM: CT HEAD WITHOUT CONTRAST CT CERVICAL SPINE WITHOUT CONTRAST TECHNIQUE: Multidetector CT imaging of the head and cervical spine was performed following the standard protocol without intravenous contrast. Multiplanar CT image  reconstructions of the cervical spine were also generated. COMPARISON:  CT head dated 06/25/2017 FINDINGS: CT HEAD FINDINGS Brain: No evidence of acute infarction, hemorrhage, hydrocephalus, extra-axial collection or mass lesion/mass effect. 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 Secondary ex vacuo dilatation of the occipital horn of the left lateral ventricle. Subcortical white matter and  periventricular small vessel ischemic changes. 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 Skull: Normal. Negative for fracture or focal lesion. Sinuses/Orbits: The visualized paranasal sinuses are essentially clear. The mastoid air cells are unopacified. Other: None. CT CERVICAL SPINE FINDINGS Alignment: Mild straightening of the cervical spine. Skull base and vertebrae: No acute fracture. No primary bone lesion or focal pathologic process. Soft tissues and spinal canal: No prevertebral fluid or swelling. No visible canal hematoma. Disc levels: Mild-to-moderate degenerative changes of the upper cervical spine. Spinal canal is patent. Upper chest: Visualized lung apices are clear. Other: Visualized thyroid is unremarkable. IMPRESSION: No evidence of acute intracranial abnormality. 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 No evidence of traumatic injury to the cervical spine. Mild to moderate degenerative changes. Electronically Signed   By: Charline BillsSriyesh  Emalene Welte M.D.   On: 07/29/2017 14:06   Ct Cervical Spine Wo Contrast  Result Date: 07/29/2017 CLINICAL DATA:  Fall, altered mental status, dementia EXAM: CT HEAD WITHOUT CONTRAST CT CERVICAL SPINE WITHOUT CONTRAST TECHNIQUE: Multidetector CT imaging of the head and cervical spine was performed following the standard protocol without intravenous contrast. Multiplanar CT image reconstructions of the cervical spine were also generated. COMPARISON:  CT head dated 06/25/2017 FINDINGS: CT HEAD FINDINGS Brain: No evidence of acute infarction, hemorrhage, hydrocephalus, extra-axial collection or mass lesion/mass effect. 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 Secondary ex vacuo dilatation of the occipital horn of the left lateral ventricle. Subcortical white matter and periventricular small vessel ischemic  changes. 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 Skull: Normal. Negative for fracture or focal lesion. Sinuses/Orbits: The visualized paranasal sinuses are essentially clear. The mastoid air cells are unopacified. Other: None. CT CERVICAL SPINE FINDINGS Alignment: Mild straightening of the cervical spine. Skull base and vertebrae: No acute fracture. No primary bone lesion or focal pathologic process. Soft tissues and spinal canal: No prevertebral fluid or swelling. No visible canal hematoma. Disc levels: Mild-to-moderate degenerative changes of the upper cervical spine. Spinal canal is patent. Upper chest: Visualized lung apices are clear. Other: Visualized thyroid is unremarkable. IMPRESSION: No evidence of acute intracranial abnormality. 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 No evidence of traumatic injury to the cervical spine. Mild to moderate degenerative changes. Electronically Signed   By: Charline BillsSriyesh  Aseel [place] M.D.   On: 07/29/2017 14:06     Medications:  Scheduled: . clonazePAM  0.5 mg Oral TID  . folic acid  1 mg Oral Daily  . heparin  5,000 Units Subcutaneous Q8H  . irbesartan  300 mg Oral Daily   And  . hydrochlorothiazide  25 mg Oral Daily  . lacosamide  150 mg Oral BID  . lamoTRIgine  200 mg Oral BID  . metoprolol tartrate  25 mg Oral BID  . polyethylene glycol  17 g Oral Daily  . potassium chloride  10 mEq Oral Daily  . QUEtiapine  300 mg Oral QHS  . sennosides-docusate sodium  2 tablet Oral Daily  .  traZODone  150 mg Oral QHS   Continuous: . dextrose 75 mL/hr at 07/30/17 0139   PRN:  Assessment/Plan:  Principal Problem:   Hypoglycemia Active Problems:   Hypertension   History of CVA (cerebrovascular accident)   Hyperlipidemia   Seizure (HCC)   Bradycardia    Hypoglycemia Patient not noted to be on any glucose lowering agents  at home. Her hypoglycemia is most likely due to poor oral intake. Currently, requiring D10 infusion. Monitor CBGs. Check HbA1c.  Increased somnolence. Probably due to recent dose changes to her antiepileptic medications. Mental status appears to be at baseline now. CT head did not show any acute findings. Continue to monitor for now.  History of seizure disorder. Doses of Lamictal and Vimpat were reduced after discussions with neurology.  History of stroke. Not noted to be an aspirin and was started on the same. No focal neurological deficits noted on examination.  History of essential hypertension.  Continue home medications.   DVT Prophylaxis: subcutaneous heparin    Code Status: full code  Family Communication: no family noted at bedside  Disposition Plan: management as outlined above. Await CBGs to stabilize. PT and OT evaluation.   LOS: 0 days   [facility]  [facility] Pager [phone] 07/30/2017, 10:40 AM  If 7PM-7AM, please contact night-coverage at [email or web address], password [facility]

## 2017-07-30 NOTE — Evaluation (Signed)
Physical Therapy Evaluation Patient Details Name: Angela Fields MRN: [id number] DOB: 26-Oct-1950 Today's Date: 07/30/2017   History of Present Illness  Angela Fields is a 66 y.o. female with medical history significant of a stroke with aphasia and right hemianopia, hypertension, hyperlipidemia, seizure disorder admitted through ED s/p falls and with AMS.  Clinical Impression  Pt admitted as above and presenting with functional mobility limitations 2* ambulatory balance deficits, dementia related cognitive deficits and questionable safety awareness.  To return home, pt would require 24/7 assist for safety.  If unavailable, pt would benefit from follow up rehab at SNF level to maximize safety and IND.    Follow Up Recommendations SNF;Supervision/Assistance - 24 hour;Home health PT    Equipment Recommendations  None recommended by PT    Recommendations for Other Services OT consult     Precautions / Restrictions Precautions Precautions: Fall Restrictions Weight Bearing Restrictions: No      Mobility  Bed Mobility Overal bed mobility: Needs Assistance Bed Mobility: Supine to Sit;Sit to Supine     Supine to sit: Supervision Sit to supine: Min assist   General bed mobility comments: Increased time with use of bed rail to exit bed.  Min assist to initiate legs into bed with pt not following verbal cues  Transfers Overall transfer level: Needs assistance Equipment used: None Transfers: Sit to/from Stand Sit to Stand: Min assist;Min guard         General transfer comment: cues for safe transition position and use of UEs to self assist.  Min assist/guard for stability with initial standing  Ambulation/Gait Ambulation/Gait assistance: Min assist Ambulation Distance (Feet): 400 Feet Assistive device: 1 person hand held assist Gait Pattern/deviations: Step-through pattern;Decreased step length - right;Decreased step length - left;Shuffle;Wide base of support Gait velocity: mod  pace   General Gait Details: Pt declining to utilize RW.  Min HHA for stability with pt highly distractable and with limited safety awarenes, pt is at significant risk of falling   Stairs            Wheelchair Mobility    Modified Rankin (Stroke Patients Only)       Balance Overall balance assessment: Needs assistance Sitting-balance support: No upper extremity supported;Feet supported Sitting balance-Leahy Scale: Good     Standing balance support: No upper extremity supported Standing balance-Leahy Scale: Fair                               Pertinent Vitals/Pain Pain Assessment: No/denies pain    Home Living Family/patient expects to be discharged to:: Unsure Living Arrangements: Spouse/significant other               Additional Comments: Due to aphasia and questionable cognitive deficits, pt was unable to clarify home living 100%.  It seems she lives with husband in 1 level home without steps and that she has a walker but does not use it.    Prior Function           Comments: unclear 2* pt questionable cognition.  Pt indicates IND     Hand Dominance        Extremity/Trunk Assessment   Upper Extremity Assessment Upper Extremity Assessment: Overall WFL for tasks assessed    Lower Extremity Assessment Lower Extremity Assessment: Overall WFL for tasks assessed    Cervical / Trunk Assessment Cervical / Trunk Assessment: Normal  Communication   Communication: Expressive difficulties  Cognition Arousal/Alertness: Awake/alert Behavior During Therapy:  Impulsive Overall Cognitive Status: History of cognitive impairments - at baseline                                        General Comments      Exercises     Assessment/Plan    PT Assessment Patient needs continued PT services  PT Problem List Decreased balance;Decreased mobility;Decreased knowledge of use of DME;Decreased safety awareness       PT Treatment  Interventions DME instruction;Gait training;Functional mobility training;Therapeutic activities;Therapeutic exercise;Balance training;Cognitive remediation;Patient/family education    PT Goals (Current goals can be found in the Care Plan section)  Acute Rehab PT Goals Patient Stated Goal: No goals stated PT Goal Formulation: Patient unable to participate in goal setting Time For Goal Achievement: 08/13/17 Potential to Achieve Goals: Fair    Frequency Min 3X/week   Barriers to discharge        Co-evaluation               AM-PAC PT "6 Clicks" Daily Activity  Outcome Measure Difficulty turning over in bed (including adjusting bedclothes, sheets and blankets)?: A Little Difficulty moving from lying on back to sitting on the side of the bed? : A Little Difficulty sitting down on and standing up from a chair with arms (e.g., wheelchair, bedside commode, etc,.)?: A Little Help needed moving to and from a bed to chair (including a wheelchair)?: A Little Help needed walking in hospital room?: A Little Help needed climbing 3-5 steps with a railing? : A Little 6 Click Score: 18    End of Session Equipment Utilized During Treatment: Gait belt Activity Tolerance: Patient tolerated treatment well Patient left: in bed;with call bell/phone within reach Nurse Communication: Mobility status PT Visit Diagnosis: Unsteadiness on feet (R26.81);Difficulty in walking, not elsewhere classified (R26.2);History of falling (Z91.81)    Time: [id number] PT Time Calculation (min) (ACUTE ONLY): 22 min   Charges:   PT Evaluation $PT Eval Moderate Complexity: 1 Mod     PT G Codes:   PT G-Codes **NOT FOR INPATIENT CLASS** Functional Assessment Tool Used: AM-PAC 6 Clicks Basic Mobility Functional Limitation: Mobility: Walking and moving around Mobility: Walking and Moving Around Current Status ([id number]([id number]): At least 40 percent but less than 60 percent impaired, limited or restricted Mobility: Walking and  Moving Around Goal Status [phone](G8979): At least 1 percent but less than 20 percent impaired, limited or restricted    Pg [phone]   Henok Heacock 07/30/2017, 10:39 AM

## 2017-07-30 NOTE — ED Notes (Signed)
Call report to Amil AmenJulia [phone] @ 1210. [address], Jovita Persing Johnson

## 2017-07-30 NOTE — H&P (Signed)
History and Physical    Angela Fields ZOX:[id number] DOB: 15-Nov-1950 DOA: 07/29/2017  PCP: Caffie Damme, MD   Patient coming from: Home.  I have personally briefly reviewed patient's old medical records in [facility]  Chief Complaint: fall.  HPI: Angela Fields is a 66 y.o. female with medical history significant of dementia, type 2 diabetes, hyperlipidemia, hypertension, seizures, CVA who was brought to the emergency department via EMS after having 2 falls at home, then following to sleep on the floor apparently due to increased drowsiness after her antiepileptic medications (Lamictal and Vimpat) were titrated up recently. She is able to answer simple questions sometimes, but is unable to provide further history.  ED Course: Initial vital signs temperature 37.26F, pulse 57, respirations 18, blood pressure 167/75 mmHg O2 sat 95% on room air. The patient has been noticed to have persistent hypoglycemia with at least 5 readings ranging between 49-59 mg/dL despite being administered dextrose 50% and having eaten multiple food items earlier. She was given her evening regular medications at home. I started the medication on a dextrose 10% infusion in the ED.  Her workup shows normal urinalysis, normal CBC, normal ammonia level, normal troponin level, unremarkable EKG, normal CMP except for mildly elevated creatinine of 1.24 mg/dL.  Her imaging shows CT head with old left parietal occipital stroke, but no acute intracranial pathology. No trauma was seen on CT spine. Chest radiograph showed on the aortic atherosclerosis.  Review of Systems: unable to obtain due to the patient's dementia.    Past Medical History:  Diagnosis Date  . Dementia   . Diabetes mellitus without complication (HCC)   . Hyperlipidemia   . Hypertension   . Seizures (HCC)   . Stroke [facility])     History reviewed. No pertinent surgical history.   reports that she has never smoked. She has never used smokeless  tobacco. She reports that she does not drink alcohol or use drugs.  No Known Allergies  Family history: Unable to obtain due to patient's dementia.    Prior to Admission medications   Medication Sig Start Date End Date Taking? Authorizing Provider  acetaminophen (TYLENOL 8 HOUR ARTHRITIS PAIN) 650 MG CR tablet Take 650 mg by mouth every 4 (four) hours as needed for pain.   Yes [provider]  clonazePAM (KLONOPIN) 1 MG tablet Take 0.5 mg by mouth 3 (three) times daily. 05/23/17  Yes [provider]  folic acid (FOLVITE) 1 MG tablet Take 1 mg by mouth daily.   Yes [provider]  lacosamide (VIMPAT) 200 MG TABS tablet Take 1 tablet (200 mg total) by mouth 2 (two) times daily. 06/22/17  Yes Osei-Bonsu, Greggory Stallion, MD  lamoTRIgine (LAMICTAL) 200 MG tablet Take 1 tablet (200 mg total) by mouth 2 (two) times daily. 06/22/17  Yes Osei-Bonsu, Greggory Stallion, MD  metoprolol tartrate (LOPRESSOR) 25 MG tablet Take 25 mg by mouth 2 (two) times daily.   Yes [provider]  polyethylene glycol (MIRALAX / GLYCOLAX) packet Take 17 g by mouth daily.   Yes [provider]  potassium chloride (K-DUR) 10 MEQ tablet Take 10 mEq by mouth daily.   Yes [provider]  QUEtiapine (SEROQUEL XR) 300 MG 24 hr tablet Take 300 mg by mouth at bedtime.   Yes [provider]  sennosides-docusate sodium (SENOKOT-S) 8.6-50 MG tablet Take 2 tablets by mouth daily.   Yes [provider]  traZODone (DESYREL) 50 MG tablet Take 1 tablet (50 mg total) by mouth at  bedtime. Patient taking differently: Take 150 mg by mouth at bedtime.  06/26/17  Yes Lord, Herminio Heads, NP  valsartan-hydrochlorothiazide (DIOVAN-HCT) 320-25 MG tablet Take 1 tablet by mouth daily.   Yes [provider]  white petrolatum ointment Apply 1 application topically 3 (three) times daily as needed for dry skin.   Yes [provider]  pravastatin (PRAVACHOL) 10 MG tablet Take 10 mg by mouth  at bedtime.     [provider]    Physical Exam: Vitals:   07/30/17 0000 07/30/17 0030 07/30/17 0100 07/30/17 0130  BP: (!) 147/69 (!) 151/74 (!) 146/65 138/62  Pulse: 63 63 71 68  Resp: 17 14 17  (!) 22  Temp:      TempSrc:      SpO2: 97% 96% 98% 95%  Weight:      Height:        Constitutional: NAD, calm, comfortable Eyes: PERRL, lids and conjunctivae normal ENMT: Mucous membranes are moist. Posterior pharynx clear of any exudate or lesions. Neck: normal, supple, no masses, no thyromegaly Respiratory: clear to auscultation bilaterally, no wheezing, no crackles. Normal respiratory effort. No accessory muscle use.  Cardiovascular: Regular rate and rhythm, no murmurs / rubs / gallops. No extremity edema. 2+ pedal pulses. No carotid bruits.  Abdomen: Soft, no tenderness, no masses palpated. No hepatosplenomegaly. Bowel sounds positive.  Musculoskeletal: no clubbing / cyanosis. Good ROM, no contractures. Normal muscle tone.  Skin: no significant rashes, lesions, ulcers on very limited skin exam Neurologic: expressive aphasia, Sensation is grossly intact, DTR normal. Strength seems to be grossly equal 5/5 in all 4. Unable to perform nose to finger (instructions understanding?).  Psychiatric: Alert and oriented x 1, disoriented to place, time and situation.    Labs on Admission: I have personally reviewed following labs and imaging studies  CBC:  Recent Labs Lab 07/29/17 1432  WBC 5.6  NEUTROABS 3.7  HGB 12.4  HCT 37.8  MCV 92.2  PLT 268   Basic Metabolic Panel:  Recent Labs Lab 07/29/17 1432  NA 141  K 3.7  CL 102  CO2 30  GLUCOSE 84  BUN 9  CREATININE 1.24*  CALCIUM 9.1   GFR: Estimated Creatinine Clearance: 43.5 mL/min (A) (by C-G formula based on SCr of 1.24 mg/dL (H)). Liver Function Tests:  Recent Labs Lab 07/29/17 1432  AST 19  ALT 19  ALKPHOS 96  BILITOT 0.4  PROT 7.7  ALBUMIN 4.3   No results for input(s): LIPASE, AMYLASE in the last  168 hours.  Recent Labs Lab 07/29/17 1432  AMMONIA 11   Coagulation Profile: No results for input(s): INR, PROTIME in the last 168 hours. Cardiac Enzymes: No results for input(s): CKTOTAL, CKMB, CKMBINDEX, TROPONINI in the last 168 hours. BNP (last 3 results) No results for input(s): PROBNP in the last 8760 hours. HbA1C: No results for input(s): HGBA1C in the last 72 hours. CBG:  Recent Labs Lab 07/29/17 1801 07/29/17 1927 07/29/17 2219 07/29/17 2256 07/29/17 2355  GLUCAP 59* 58* 52* 49* 96   Lipid Profile: No results for input(s): CHOL, HDL, LDLCALC, TRIG, CHOLHDL, LDLDIRECT in the last 72 hours. Thyroid Function Tests: No results for input(s): TSH, T4TOTAL, FREET4, T3FREE, THYROIDAB in the last 72 hours. Anemia Panel: No results for input(s): VITAMINB12, FOLATE, FERRITIN, TIBC, IRON, RETICCTPCT in the last 72 hours. Urine analysis:    Component Value Date/Time   COLORURINE STRAW (A) 07/29/2017 1347   APPEARANCEUR CLEAR 07/29/2017 1347   LABSPEC 1.005 07/29/2017 1347  PHURINE 7.0 07/29/2017 1347   GLUCOSEU NEGATIVE 07/29/2017 1347   HGBUR NEGATIVE 07/29/2017 1347   BILIRUBINUR NEGATIVE 07/29/2017 1347   KETONESUR NEGATIVE 07/29/2017 1347   PROTEINUR NEGATIVE 07/29/2017 1347   NITRITE NEGATIVE 07/29/2017 1347   LEUKOCYTESUR NEGATIVE 07/29/2017 1347    Radiological Exams on Admission: Dg Chest 2 View  Result Date: 07/29/2017 CLINICAL DATA:  Pain following fall EXAM: CHEST  2 VIEW COMPARISON:  None. FINDINGS: There is no edema or consolidation. Heart is upper normal in size with pulmonary vascularity within normal limits. There is aortic atherosclerosis. No evident adenopathy. No bone lesions. IMPRESSION: Aortic atherosclerosis.  No edema or consolidation. Aortic Atherosclerosis (ICD10-I70.0). Electronically Signed   By: Bretta BangWilliam  Woodruff III M.D.   On: 07/29/2017 14:19   Ct Head Wo Contrast  Result Date: 07/29/2017 CLINICAL DATA:  Fall, altered mental status,  dementia EXAM: CT HEAD WITHOUT CONTRAST CT CERVICAL SPINE WITHOUT CONTRAST TECHNIQUE: Multidetector CT imaging of the head and cervical spine was performed following the standard protocol without intravenous contrast. Multiplanar CT image reconstructions of the cervical spine were also generated. COMPARISON:  CT head dated 06/25/2017 FINDINGS: CT HEAD FINDINGS Brain: No evidence of acute infarction, hemorrhage, hydrocephalus, extra-axial collection or mass lesion/mass effect. 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 Secondary ex vacuo dilatation of the occipital horn of the left lateral ventricle. Subcortical white matter and periventricular small vessel ischemic changes. 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 Skull: Normal. Negative for fracture or focal lesion. Sinuses/Orbits: The visualized paranasal sinuses are essentially clear. The mastoid air cells are unopacified. Other: None. CT CERVICAL SPINE FINDINGS Alignment: Mild straightening of the cervical spine. Skull base and vertebrae: No acute fracture. No primary bone lesion or focal pathologic process. Soft tissues and spinal canal: No prevertebral fluid or swelling. No visible canal hematoma. Disc levels: Mild-to-moderate degenerative changes of the upper cervical spine. Spinal canal is patent. Upper chest: Visualized lung apices are clear. Other: Visualized thyroid is unremarkable. IMPRESSION: No evidence of acute intracranial abnormality. 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 No evidence of traumatic injury to the cervical spine. Mild to moderate degenerative changes. Electronically Signed   By: Charline BillsSriyesh  Krishnan M.D.   On: 07/29/2017 14:06   Ct Cervical Spine Wo Contrast  Result Date: 07/29/2017 CLINICAL DATA:  Fall, altered mental status, dementia EXAM: CT HEAD WITHOUT CONTRAST CT  CERVICAL SPINE WITHOUT CONTRAST TECHNIQUE: Multidetector CT imaging of the head and cervical spine was performed following the standard protocol without intravenous contrast. Multiplanar CT image reconstructions of the cervical spine were also generated. COMPARISON:  CT head dated 06/25/2017 FINDINGS: CT HEAD FINDINGS Brain: No evidence of acute infarction, hemorrhage, hydrocephalus, extra-axial collection or mass lesion/mass effect. 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 Secondary ex vacuo dilatation of the occipital horn of the left lateral ventricle. Subcortical white matter and periventricular small vessel ischemic changes. 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 Skull: Normal. Negative for fracture or focal lesion. Sinuses/Orbits: The visualized paranasal sinuses are essentially clear. The mastoid air cells are unopacified. Other: None. CT CERVICAL SPINE FINDINGS Alignment: Mild straightening of the cervical spine. Skull base and vertebrae: No acute fracture. No primary bone lesion or focal pathologic process. Soft tissues and spinal canal: No prevertebral fluid or swelling. No visible canal hematoma. Disc levels: Mild-to-moderate degenerative changes of the upper  cervical spine. Spinal canal is patent. Upper chest: Visualized lung apices are clear. Other: Visualized thyroid is unremarkable. IMPRESSION: No evidence of acute intracranial abnormality. 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 No evidence of traumatic injury to the cervical spine. Mild to moderate degenerative changes. Electronically Signed   By: Charline Bills M.D.   On: 07/29/2017 14:06    EKG: Independently reviewed.  Vent. rate 60 BPM PR interval * ms QRS duration 99 ms QT/QTc 398/398 ms P-R-T axes 56 13 50 Sinus rhythm Low voltage, precordial  leads  Assessment/Plan Principal Problem:   Hypoglycemia Observation/telemetry. Continue dextrose 10% infusion. Monitor CBG periodically. Encourage oral intake.  Active Problems:   Hypertension Continue metoprolol 25 mg by mouth twice a day. Monitor blood pressure and heart rate. Consider adjusting his bradycardia persists.    History of CVA (cerebrovascular accident) Not on aspirin, unless is not listed in her medications. Aspirin 81 mg by mouth daily. Supportive care.    Hyperlipidemia Continue pravastatin 10 mg by mouth daily.    Seizure [facility]) Per neurology, which was contacted by EDP: Decrease Lamictal to 200 mg by mouth twice a day. Decrease Vimpat to 150 mg by mouth twice a day.    Bradycardia On metoprolol 25 mg by mouth twice a day. Telemetry monitoring. Monitor heart rate.    Constipation Continue daily MiraLAX and Senokot.   DVT prophylaxis: Heparin SQ. Code Status: Full code. Family Communication: None at bedside. Disposition Plan: observation for hypoglycemia treatment and monitoring. Consults called:  Admission status: observation/telemetry.   Bobette Mo MD [facility] Pager [phone].  If 7PM-7AM, please contact night-coverage [email or web address] Password [facility]  07/30/2017, 2:07 AM   This document was prepared using Dragon voice recognition software and may contain some unintended errors.

## 2017-07-31 DIAGNOSIS — R569 Unspecified convulsions: Secondary | ICD-10-CM

## 2017-07-31 DIAGNOSIS — G40909 Epilepsy, unspecified, not intractable, without status epilepticus: Secondary | ICD-10-CM | POA: Diagnosis present

## 2017-07-31 DIAGNOSIS — I10 Essential (primary) hypertension: Secondary | ICD-10-CM | POA: Diagnosis present

## 2017-07-31 DIAGNOSIS — E11649 Type 2 diabetes mellitus with hypoglycemia without coma: Secondary | ICD-10-CM | POA: Diagnosis present

## 2017-07-31 DIAGNOSIS — Z7982 Long term (current) use of aspirin: Secondary | ICD-10-CM | POA: Diagnosis not present

## 2017-07-31 DIAGNOSIS — I700 Atherosclerosis of aorta: Secondary | ICD-10-CM | POA: Diagnosis present

## 2017-07-31 DIAGNOSIS — G4733 Obstructive sleep apnea (adult) (pediatric): Secondary | ICD-10-CM | POA: Diagnosis present

## 2017-07-31 DIAGNOSIS — Z8673 Personal history of transient ischemic attack (TIA), and cerebral infarction without residual deficits: Secondary | ICD-10-CM | POA: Diagnosis not present

## 2017-07-31 DIAGNOSIS — R001 Bradycardia, unspecified: Secondary | ICD-10-CM | POA: Diagnosis present

## 2017-07-31 DIAGNOSIS — Z79899 Other long term (current) drug therapy: Secondary | ICD-10-CM | POA: Diagnosis not present

## 2017-07-31 DIAGNOSIS — E785 Hyperlipidemia, unspecified: Secondary | ICD-10-CM | POA: Diagnosis present

## 2017-07-31 DIAGNOSIS — K5900 Constipation, unspecified: Secondary | ICD-10-CM | POA: Diagnosis present

## 2017-07-31 DIAGNOSIS — F0391 Unspecified dementia with behavioral disturbance: Secondary | ICD-10-CM | POA: Diagnosis present

## 2017-07-31 DIAGNOSIS — E162 Hypoglycemia, unspecified: Secondary | ICD-10-CM | POA: Diagnosis present

## 2017-07-31 LAB — BASIC METABOLIC PANEL
ANION GAP: 9 (ref 5–15)
BUN: 14 mg/dL (ref 6–20)
CO2: 30 mmol/L (ref 22–32)
Calcium: 8.7 mg/dL — ABNORMAL LOW (ref 8.9–10.3)
Chloride: 100 mmol/L — ABNORMAL LOW (ref 101–111)
Creatinine, Ser: 1.13 mg/dL — ABNORMAL HIGH (ref 0.44–1.00)
GFR calc Af Amer: 57 mL/min — ABNORMAL LOW (ref >60)
GFR, EST NON AFRICAN AMERICAN: 49 mL/min — AB (ref >60)
Glucose, Bld: 106 mg/dL — ABNORMAL HIGH (ref 65–99)
POTASSIUM: 3.8 mmol/L (ref 3.5–5.1)
SODIUM: 139 mmol/L (ref 135–145)

## 2017-07-31 LAB — GLUCOSE, CAPILLARY
GLUCOSE-CAPILLARY: 101 mg/dL — AB (ref 65–99)
GLUCOSE-CAPILLARY: 104 mg/dL — AB (ref 65–99)
GLUCOSE-CAPILLARY: 109 mg/dL — AB (ref 65–99)
GLUCOSE-CAPILLARY: 113 mg/dL — AB (ref 65–99)
GLUCOSE-CAPILLARY: 90 mg/dL (ref 65–99)
Glucose-Capillary: 97 mg/dL (ref 65–99)
Glucose-Capillary: 93 mg/dL (ref 65–99)
Glucose-Capillary: 126 mg/dL — ABNORMAL HIGH (ref 65–99)
Glucose-Capillary: 90 mg/dL (ref 65–99)
Glucose-Capillary: 81 mg/dL (ref 65–99)
Glucose-Capillary: 102 mg/dL — ABNORMAL HIGH (ref 65–99)
Glucose-Capillary: 100 mg/dL — ABNORMAL HIGH (ref 65–99)

## 2017-07-31 LAB — HEMOGLOBIN A1C
Hgb A1c MFr Bld: 5.4 % (ref 4.8–5.6)
MEAN PLASMA GLUCOSE: 108.28 mg/dL

## 2017-07-31 MED ORDER — DEXTROSE 50 % IV SOLN
INTRAVENOUS | Status: AC
  Administered 2017-07-31: 25 mL
  Filled 2017-07-31: qty 50

## 2017-07-31 NOTE — Progress Notes (Addendum)
CM spoke with husband via phone. Husband states that his DC plan is for pt to come back home with home health services. [facility] contacted for [facility]. Will need orders for HHPT/OT/RN at dc. Sandford Crazeora Sameeha Rockefeller RN,BSN,NCM [phone]

## 2017-07-31 NOTE — Care Management Obs Status (Signed)
MEDICARE OBSERVATION STATUS NOTIFICATION   Patient Details  Name: Philomena DohenyCarolyn Molony MRN: [id number] Date of Birth: May 16, 1951   Medicare Observation Status Notification Given:  Yes  Pt confused and husband unavailable. Copy left in room.  Bartholome BillCLEMENTS, Lexey Fletes H, RN 07/31/2017, 1:38 PM

## 2017-07-31 NOTE — Progress Notes (Signed)
[facility] PROGRESS NOTE  Angela Fields ZOX:[id number] DOB: September 23, 1951 DOA: 07/29/2017  PCP: Caffie Damme, MD  Brief History/Interval Summary: 65 year old African-American female with a past medical history dementia, type 2 diabetes, hyperlipidemia, hypertension, seizures, previous stroke who presented after having 2 falls at home. Patient had been more drowsy and somnolent in the last few weeks. According to family her antiepileptic medication including Lamictal and Vimpat doses were increased recently. Patient also found to have hypoglycemia. She required D10 infusion.  Reason for Visit: hypoglycemia  Consultants: none  Procedures: none  Antibiotics: none  Subjective/Interval History: Patient is a poor historian. She states that she is feeling well.  ROS: denies any nausea, vomiting.  Objective:  Vital Signs  Vitals:   07/30/17 1254 07/30/17 2020 07/31/17 0408 07/31/17 0900  BP: (!) 136/53 (!) 147/67 116/60 (!) 110/97  Pulse: (!) 58 (!) 59 60 (!) 57  Resp:  18 18   Temp:  97.8 F (36.6 C) 97.6 F (36.4 C)   TempSrc:  Oral Oral   SpO2: 100% 99% 100%   Weight:      Height:        Intake/Output Summary (Last 24 hours) at 07/31/17 1250 Last data filed at 07/31/17 1200  Gross per 24 hour  Intake          2858.33 ml  Output                0 ml  Net          2858.33 ml   Filed Weights   07/29/17 1214  Weight: 73 kg (161 lb)    General appearance: awake alert. Distracted. In no distress Resp: clear to auscultation bilaterally Cardio:S1, S2 normal. Regular. No distress.. No rubs, murmurs, bruits GI: abdomen is soft. Nontender, nondistended. Bowel sounds are present. No masses or organomegaly. Extremities: no edema Neurologic: no obvious focal neurological deficits noted. She is disoriented due to her history of dementia  Lab Results:  Data Reviewed: I have personally reviewed following labs and imaging studies  CBC:  Recent Labs Lab 07/29/17 1432   WBC 5.6  NEUTROABS 3.7  HGB 12.4  HCT 37.8  MCV 92.2  PLT 268    Basic Metabolic Panel:  Recent Labs Lab 07/29/17 1432 07/31/17 0521  NA 141 139  K 3.7 3.8  CL 102 100*  CO2 30 30  GLUCOSE 84 106*  BUN 9 14  CREATININE 1.24* 1.13*  CALCIUM 9.1 8.7*    GFR: Estimated Creatinine Clearance: 47.7 mL/min (A) (by C-G formula based on SCr of 1.13 mg/dL (H)).  Liver Function Tests:  Recent Labs Lab 07/29/17 1432  AST 19  ALT 19  ALKPHOS 96  BILITOT 0.4  PROT 7.7  ALBUMIN 4.3    Recent Labs Lab 07/29/17 1432  AMMONIA 11    CBG:  Recent Labs Lab 07/31/17 0403 07/31/17 0710 07/31/17 0913 07/31/17 1037 07/31/17 1203  GLUCAP 93 104* 113* 126* 101*             Radiology Studies: Dg Chest 2 View  Result Date: 07/29/2017 CLINICAL DATA:  Pain following fall EXAM: CHEST  2 VIEW COMPARISON:  None. FINDINGS: There is no edema or consolidation. Heart is upper normal in size with pulmonary vascularity within normal limits. There is aortic atherosclerosis. No evident adenopathy. No bone lesions. IMPRESSION: Aortic atherosclerosis.  No edema or consolidation. Aortic Atherosclerosis (ICD10-I70.0). Electronically Signed   By: Bretta Bang III M.D.   On: 07/29/2017 14:19  Ct Head Wo Contrast  Result Date: 07/29/2017 CLINICAL DATA:  Fall, altered mental status, dementia EXAM: CT HEAD WITHOUT CONTRAST CT CERVICAL SPINE WITHOUT CONTRAST TECHNIQUE: Multidetector CT imaging of the head and cervical spine was performed following the standard protocol without intravenous contrast. Multiplanar CT image reconstructions of the cervical spine were also generated. COMPARISON:  CT head dated 06/25/2017 FINDINGS: CT HEAD FINDINGS Brain: No evidence of acute infarction, hemorrhage, hydrocephalus, extra-axial collection or mass lesion/mass effect. 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 Secondary ex vacuo dilatation of the occipital horn of the left  lateral ventricle. Subcortical white matter and periventricular small vessel ischemic changes. 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 Skull: Normal. Negative for fracture or focal lesion. Sinuses/Orbits: The visualized paranasal sinuses are essentially clear. The mastoid air cells are unopacified. Other: None. CT CERVICAL SPINE FINDINGS Alignment: Mild straightening of the cervical spine. Skull base and vertebrae: No acute fracture. No primary bone lesion or focal pathologic process. Soft tissues and spinal canal: No prevertebral fluid or swelling. No visible canal hematoma. Disc levels: Mild-to-moderate degenerative changes of the upper cervical spine. Spinal canal is patent. Upper chest: Visualized lung apices are clear. Other: Visualized thyroid is unremarkable. IMPRESSION: No evidence of acute intracranial abnormality. 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 No evidence of traumatic injury to the cervical spine. Mild to moderate degenerative changes. Electronically Signed   By: Charline Bills M.D.   On: 07/29/2017 14:06   Ct Cervical Spine Wo Contrast  Result Date: 07/29/2017 CLINICAL DATA:  Fall, altered mental status, dementia EXAM: CT HEAD WITHOUT CONTRAST CT CERVICAL SPINE WITHOUT CONTRAST TECHNIQUE: Multidetector CT imaging of the head and cervical spine was performed following the standard protocol without intravenous contrast. Multiplanar CT image reconstructions of the cervical spine were also generated. COMPARISON:  CT head dated 06/25/2017 FINDINGS: CT HEAD FINDINGS Brain: No evidence of acute infarction, hemorrhage, hydrocephalus, extra-axial collection or mass lesion/mass effect. 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 Secondary ex vacuo dilatation of the occipital horn of the left lateral ventricle. Subcortical white  matter and periventricular small vessel ischemic changes. 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 Skull: Normal. Negative for fracture or focal lesion. Sinuses/Orbits: The visualized paranasal sinuses are essentially clear. The mastoid air cells are unopacified. Other: None. CT CERVICAL SPINE FINDINGS Alignment: Mild straightening of the cervical spine. Skull base and vertebrae: No acute fracture. No primary bone lesion or focal pathologic process. Soft tissues and spinal canal: No prevertebral fluid or swelling. No visible canal hematoma. Disc levels: Mild-to-moderate degenerative changes of the upper cervical spine. Spinal canal is patent. Upper chest: Visualized lung apices are clear. Other: Visualized thyroid is unremarkable. IMPRESSION: No evidence of acute intracranial abnormality. 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 No evidence of traumatic injury to the cervical spine. Mild to moderate degenerative changes. Electronically Signed   By: Charline Bills M.D.   On: 07/29/2017 14:06     Medications:  Scheduled: . clonazePAM  0.5 mg Oral TID  . folic acid  1 mg Oral Daily  . heparin  5,000 Units Subcutaneous Q8H  . irbesartan  300 mg Oral Daily   And  . hydrochlorothiazide  25 mg Oral Daily  . lacosamide  150 mg Oral BID  . lamoTRIgine  200  mg Oral BID  . metoprolol tartrate  25 mg Oral BID  . polyethylene glycol  17 g Oral Daily  . potassium chloride  10 mEq Oral Daily  . QUEtiapine  300 mg Oral QHS  . senna-docusate  2 tablet Oral Daily  . traZODone  150 mg Oral QHS   Continuous: . dextrose 50 mL/hr at 07/31/17 [id number]   PRN:  Assessment/Plan:  Principal Problem:   Hypoglycemia Active Problems:   Hypertension   History of CVA (cerebrovascular accident)   Hyperlipidemia   Seizure (HCC)   Bradycardia    Hypoglycemia Reason for her  hypoglycemia is not entirely clear. Patient does not carry a diagnosis of diabetes and is not on any glucose lowering agents at home. Could be due to poor oral intake. Seems to have improved some with D10 infusion, but once again had low glucose level this morning per nursing staff. She had to be given D50. HbA1c is 5.4. Check Insulin, c-peptide, BHB, pro-insulin levels.  Increased somnolence. Probably due to recent dose changes to her antiepileptic medications. Mental status appears to be at baseline now. CT head did not show any acute findings. Continue to monitor for now.  History of seizure disorder. Doses of Lamictal and Vimpat were reduced after discussions with neurology.  History of stroke. Not noted to be an aspirin and was started on the same. No focal neurological deficits noted on examination.  History of essential hypertension.  Continue home medications.   DVT Prophylaxis: subcutaneous heparin    Code Status: full code  Family Communication: no family noted at bedside  Disposition Plan: await improvement in glucose levels.   LOS: 0 days   Asc Tcg LLCKRISHNAN,Ridhima Golberg  [facility] Pager [phone] 07/31/2017, 12:50 PM  If 7PM-7AM, please contact night-coverage at [email or web address], password [facility]

## 2017-07-31 NOTE — Progress Notes (Addendum)
LCSW consulted for disposition: ? SNF placement  Patient at this time does not qualify for SNF placement as she is walking 400 Feet.  Her insurance is [employer] which requires prior authorization and with the amount walking she will not qualify for this level of care covered under her benefit.  LCSW will discuss with CM and family alternative discharge plans such as home health. 1:53 PM LCSW spoke with patient husband, updated regarding patient status and need for home health due to patient's ability and walking 43900ft.  Husband agreeable, that is much improvement and agreeable he would like her home health vs SNF.  CM has been updated regarding plan and DC home with home health.  Will follow up tomorrow.  No other CSW needs at this time.  Available if needs arise.  Angela EmoryHannah Zayveon Raschke LCSW, MSW Clinical Social Work: Optician, dispensingystem [employer] Coverage for :  [phone]

## 2017-08-01 DIAGNOSIS — I10 Essential (primary) hypertension: Secondary | ICD-10-CM

## 2017-08-01 LAB — CBC
HCT: 35.2 % — ABNORMAL LOW (ref 36.0–46.0)
HEMOGLOBIN: 11 g/dL — AB (ref 12.0–15.0)
MCH: 28.9 pg (ref 26.0–34.0)
MCHC: 31.3 g/dL (ref 30.0–36.0)
MCV: 92.4 fL (ref 78.0–100.0)
Platelets: 231 10*3/uL (ref 150–400)
RBC: 3.81 MIL/uL — ABNORMAL LOW (ref 3.87–5.11)
RDW: 14.6 % (ref 11.5–15.5)
WBC: 5.7 10*3/uL (ref 4.0–10.5)

## 2017-08-01 LAB — BASIC METABOLIC PANEL
Anion gap: 9 (ref 5–15)
BUN: 15 mg/dL (ref 6–20)
CHLORIDE: 99 mmol/L — AB (ref 101–111)
CO2: 30 mmol/L (ref 22–32)
CREATININE: 1.24 mg/dL — AB (ref 0.44–1.00)
Calcium: 8.8 mg/dL — ABNORMAL LOW (ref 8.9–10.3)
GFR calc Af Amer: 51 mL/min — ABNORMAL LOW (ref >60)
GFR calc non Af Amer: 44 mL/min — ABNORMAL LOW (ref >60)
GLUCOSE: 108 mg/dL — AB (ref 65–99)
Potassium: 3.7 mmol/L (ref 3.5–5.1)
SODIUM: 138 mmol/L (ref 135–145)

## 2017-08-01 LAB — GLUCOSE, CAPILLARY
GLUCOSE-CAPILLARY: 108 mg/dL — AB (ref 65–99)
GLUCOSE-CAPILLARY: 95 mg/dL (ref 65–99)
GLUCOSE-CAPILLARY: 111 mg/dL — AB (ref 65–99)
GLUCOSE-CAPILLARY: 119 mg/dL — AB (ref 65–99)
Glucose-Capillary: 123 mg/dL — ABNORMAL HIGH (ref 65–99)
Glucose-Capillary: 108 mg/dL — ABNORMAL HIGH (ref 65–99)
Glucose-Capillary: 97 mg/dL (ref 65–99)

## 2017-08-01 LAB — CORTISOL: Cortisol, Plasma: 14 ug/dL

## 2017-08-01 LAB — TSH: TSH: 1.903 u[IU]/mL (ref 0.350–4.500)

## 2017-08-01 LAB — BETA-HYDROXYBUTYRIC ACID: Beta-Hydroxybutyric Acid: 0.09 mmol/L (ref 0.05–0.27)

## 2017-08-01 MED ORDER — ENSURE ENLIVE PO LIQD
237.0000 mL | Freq: Three times a day (TID) | ORAL | Status: DC
  Administered 2017-08-01 – 2017-08-05 (×13): 237 mL via ORAL

## 2017-08-01 NOTE — Progress Notes (Signed)
Occupational Therapy Treatment Patient Details Name: Angela Fields MRN: [id number] DOB: 08-Apr-1951 Today's Date: 08/01/2017    History of present illness Angela Fields is a 66 y.o. female with medical history significant of a stroke with aphasia and right hemianopia, hypertension, hyperlipidemia, seizure disorder admitted through ED s/p falls and with AMS.   OT comments  Balance Improving.  Needs supervision/assist for adls due to decreased vision  Follow Up Recommendations  Supervision/Assistance - 24 hour;Home health OT;SNF    Equipment Recommendations  3 in 1 bedside commode    Recommendations for Other Services      Precautions / Restrictions Precautions Precautions: Fall Restrictions Weight Bearing Restrictions: No       Mobility Bed Mobility               General bed mobility comments: oob  Transfers       Sit to Stand: Min guard         General transfer comment: for safety    Balance                                           ADL either performed or assessed with clinical judgement   ADL       Grooming: Wash/dry hands;Wash/dry face;Brushing hair;Min Production designer, theatre/television/film: Minimal assistance;Ambulation;Comfort height toilet   Toileting- Clothing Manipulation and Hygiene: Min guard;Sit to/from stand         General ADL Comments: stopped by when pt was eating:  assisted with set up and orienting her to where things were. She was putting brown sugar into empty cup thinking it was her oatmeal.  More steady walking to bathroom and sink.  Needed multimodal cues to step completely to commode     Vision   Additional Comments: decreased vision; h/o hemanopsia   Perception     Praxis      Cognition Arousal/Alertness: Awake/alert Behavior During Therapy: WFL for tasks assessed/performed Overall Cognitive Status: History of cognitive impairments - at baseline                                           Exercises     Shoulder Instructions       General Comments      Pertinent Vitals/ Pain       Pain Assessment: No/denies pain  Home Living                                          Prior Functioning/Environment              Frequency  Min 2X/week        Progress Toward Goals  OT Goals(current goals can now be found in the care plan section)        Plan      Co-evaluation                 AM-PAC PT "6 Clicks" Daily Activity     Outcome Measure   Help from another person eating meals?: A Little Help from another person taking care of personal grooming?: A  Little Help from another person toileting, which includes using toliet, bedpan, or urinal?: A Little Help from another person bathing (including washing, rinsing, drying)?: A Little Help from another person to put on and taking off regular upper body clothing?: A Little Help from another person to put on and taking off regular lower body clothing?: A Little 6 Click Score: 18    End of Session    OT Visit Diagnosis: Unsteadiness on feet (R26.81)   Activity Tolerance Patient tolerated treatment well   Patient Left in chair;with call bell/phone within reach;with chair alarm set   Nurse Communication          Time: [phone] OT Time Calculation (min): 39 min  Charges: OT General Charges $OT Visit: 1 Visit OT Treatments $Self Care/Home Management : 38-52 mins  Marica OtterMaryellen Zaccheus Edmister, OTR/L [phone] 08/01/2017   Davene Jobin 08/01/2017, 9:32 AM

## 2017-08-01 NOTE — Progress Notes (Signed)
Physical Therapy Treatment Patient Details Name: Angela Fields MRN: [id number] DOB: Mar 07, 1951 Today's Date: 08/01/2017    History of Present Illness Angela Fields is a 66 y.o. female with medical history significant of a stroke with aphasia and right hemianopia, hypertension, hyperlipidemia, seizure disorder admitted through ED s/p falls and with AMS.    PT Comments    Assisted pt OOB to amb a great distance however VERY UNSTEADY drunken gait with inability to self correct any balance deficit.  X 3 LOB during session, therapist recovered.  Cognitively and physically unable to safely use a walker with noted decreased R UE functional use (delayed grip/decreased strength) vs L.  Assisted back to room assisted to bathroom.  Another near fall clearing doorframe and required Min Assist to control desend to toilet safely.  Pt able to perform self peri care in standing however unable to maintain a safe balance.  Therapist assisted supporting.  Turning and side stepping, pt demonstrated increased gait instability with near LOB and poor self coorection.  Would not advise pt amb by herself.  Assisted back to bed and activited bed alarm.   Per chart review, spouse plans to have pt return home vs SNF.  Strongly rec SNF due to pt's balance and weakness.  If D/C to home will need 24/7 care and constant support when amb.   Follow Up Recommendations  SNF;Supervision/Assistance - 24 hour;Home health PT (pending spouse)     Equipment Recommendations  None recommended by PT    Recommendations for Other Services       Precautions / Restrictions Precautions Precautions: Fall Precaution Comments: s/p CVA and present expressive aphagia Restrictions Weight Bearing Restrictions: No    Mobility  Bed Mobility   Bed Mobility: Supine to Sit;Sit to Supine     Supine to sit: Supervision Sit to supine: Supervision   General bed mobility comments: 75% VC's for safety, pt impulsive  Transfers Overall  transfer level: Needs assistance Equipment used: None Transfers: Sit to/from Stand;Stand Pivot Transfers Sit to Stand: Min guard;Min assist Stand pivot transfers: Min guard;Min assist       General transfer comment: 75% VC's for safety esp with turn completion prior to sit and hand placement  Ambulation/Gait Ambulation/Gait assistance: Min assist;Mod assist Ambulation Distance (Feet): 275 Feet Assistive device: 1 person hand held assist Gait Pattern/deviations: Step-through pattern;Decreased step length - right;Decreased step length - left;Shuffle;Wide base of support Gait velocity: WFL   General Gait Details: pt unable to safely navigate walker noted decreased functional use R UE vs L.  Amb hand held assist, MOD drunken gait with later LOB x 3 therapist recovered.  HIGH FALL RISK   Stairs            Wheelchair Mobility    Modified Rankin (Stroke Patients Only)       Balance                                            Cognition Arousal/Alertness: Awake/alert   Overall Cognitive Status: History of cognitive impairments - at baseline                                 General Comments: pleasant, following commands present with expressive aphagia      Exercises      General Comments  Pertinent Vitals/Pain Pain Assessment: No/denies pain    Home Living                      Prior Function            PT Goals (current goals can now be found in the care plan section) Progress towards PT goals: Progressing toward goals    Frequency    Min 3X/week      PT Plan Current plan remains appropriate    Co-evaluation              AM-PAC PT "6 Clicks" Daily Activity  Outcome Measure  Difficulty turning over in bed (including adjusting bedclothes, sheets and blankets)?: A Little Difficulty moving from lying on back to sitting on the side of the bed? : A Little Difficulty sitting down on and standing up  from a chair with arms (e.g., wheelchair, bedside commode, etc,.)?: A Little Help needed moving to and from a bed to chair (including a wheelchair)?: A Little Help needed walking in hospital room?: A Little Help needed climbing 3-5 steps with a railing? : A Little 6 Click Score: 18    End of Session Equipment Utilized During Treatment: Gait belt Activity Tolerance: Patient tolerated treatment well Patient left: in bed;with call bell/phone within reach Nurse Communication: Mobility status PT Visit Diagnosis: Unsteadiness on feet (R26.81);Difficulty in walking, not elsewhere classified (R26.2);History of falling (Z91.81)     Time: 1349-1420 PT Time Calculation (min) (ACUTE ONLY): 31 min  Charges:  $Gait Training: 8-22 mins $Therapeutic Activity: 8-22 mins                    G Codes:       Felecia Shelling  PTA [facility]  Acute  Rehab Pager      [phone]

## 2017-08-01 NOTE — Progress Notes (Signed)
[facility] PROGRESS NOTE  Angela DohenyCarolyn Fields AVW:[id number] DOB: Dec 27, 1950 DOA: 07/29/2017  PCP: Caffie DammeSmith, Karla, MD  Brief History/Interval Summary: 66 year old African-American female with a past medical history dementia, type 2 diabetes, hyperlipidemia, hypertension, seizures, previous stroke who presented after having 2 falls at home. Patient had been more drowsy and somnolent in the last few weeks. According to family her antiepileptic medication including Lamictal and Vimpat doses were increased recently. Patient also found to have hypoglycemia. She required D10 infusion.  Reason for Visit: hypoglycemia  Consultants: none  Procedures: none  Antibiotics: none  Subjective/Interval History: Patient remains a poor historian. He states that she is feeling better.  ROS: denies any nausea, vomiting.  Objective:  Vital Signs  Vitals:   07/31/17 2122 08/01/17 0410 08/01/17 0934 08/01/17 1335  BP:  (!) 114/52  (!) 90/56  Pulse: 77 (!) 57 65 63  Resp:  18  18  Temp:  98 F (36.7 C)  98.7 F (37.1 C)  TempSrc:  Oral  Oral  SpO2:  98%  98%  Weight:      Height:        Intake/Output Summary (Last 24 hours) at 08/01/17 1506 Last data filed at 08/01/17 1416  Gross per 24 hour  Intake             1330 ml  Output                0 ml  Net             1330 ml   Filed Weights   07/29/17 1214  Weight: 73 kg (161 lb)    General appearance: wake alert. Distracted. In no distress. Resp: clear to auscultation bilaterally Cardio: S1, S2 is normal, regular. No S3, S4. No rubs, murmurs or bruit. GI: abdomen remains soft. Nontender, nondistended. Bowel sounds are present. No masses, organomegaly Extremities: no edema Neurologic: no obvious focal neurological deficits noted. She is disoriented due to her history of dementia  Lab Results:  Data Reviewed: I have personally reviewed following labs and imaging studies  CBC:  Recent Labs Lab 07/29/17 1432 08/01/17 0502  WBC  5.6 5.7  NEUTROABS 3.7  --   HGB 12.4 11.0*  HCT 37.8 35.2*  MCV 92.2 92.4  PLT 268 231    Basic Metabolic Panel:  Recent Labs Lab 07/29/17 1432 07/31/17 0521 08/01/17 0502  NA 141 139 138  K 3.7 3.8 3.7  CL 102 100* 99*  CO2 30 30 30   GLUCOSE 84 106* 108*  BUN 9 14 15   CREATININE 1.24* 1.13* 1.24*  CALCIUM 9.1 8.7* 8.8*    GFR: Estimated Creatinine Clearance: 43.5 mL/min (A) (by C-G formula based on SCr of 1.24 mg/dL (H)).  Liver Function Tests:  Recent Labs Lab 07/29/17 1432  AST 19  ALT 19  ALKPHOS 96  BILITOT 0.4  PROT 7.7  ALBUMIN 4.3    Recent Labs Lab 07/29/17 1432  AMMONIA 11    CBG:  Recent Labs Lab 08/01/17 0002 08/01/17 0147 08/01/17 0406 08/01/17 0725 08/01/17 1111  GLUCAP 109* 123* 108* 108* 95             Radiology Studies: No results found.   Medications:  Scheduled: . clonazePAM  0.5 mg Oral TID  . feeding supplement (ENSURE ENLIVE)  237 mL Oral TID BM  . folic acid  1 mg Oral Daily  . heparin  5,000 Units Subcutaneous Q8H  . irbesartan  300 mg Oral Daily  And  . hydrochlorothiazide  25 mg Oral Daily  . lacosamide  150 mg Oral BID  . lamoTRIgine  200 mg Oral BID  . metoprolol tartrate  25 mg Oral BID  . polyethylene glycol  17 g Oral Daily  . potassium chloride  10 mEq Oral Daily  . QUEtiapine  300 mg Oral QHS  . senna-docusate  2 tablet Oral Daily  . traZODone  150 mg Oral QHS   Continuous: . dextrose 50 mL/hr at 08/01/17 0147   PRN:  Assessment/Plan:  Principal Problem:   Hypoglycemia Active Problems:   Hypertension   History of CVA (cerebrovascular accident)   Hyperlipidemia   Seizure (HCC)   Bradycardia    Hypoglycemia Reason for her hypoglycemia is not entirely clear. Patient does not have a diagnosis of diabetes and is not on any glucose lowering agents at home. Initially thought to be due to poor oral intake which could still be the reason. Patient requires a D10 infusion to prevent  hypoglycemia. She will be given Ensure protein supplements. Encourage oral intake. Cortisol level normal. TSH normal. C-peptide, insulin, proinsulin levels are pending.   Increased somnolence. Probably due to recent dose changes to her antiepileptic medications. Mental status has improved from the time of admission. But she remains disoriented, which could be due to dementia.  History of seizure disorder. Doses of Lamictal and Vimpat were reduced after discussions with neurology.  History of stroke. Not noted to be an aspirin and was started on the same. No focal neurological deficits noted on examination.  History of essential hypertension.  Continue home medications.   DVT Prophylaxis: subcutaneous heparin    Code Status: full code  Family Communication: discussed with patient's husband yesterday Disposition Plan: await improvement in glucose levels. Seen by physical therapy. She may need short-term rehabilitation.   LOS: 1 day   [facility]  [facility] Pager [phone] 08/01/2017, 3:06 PM  If 7PM-7AM, please contact night-coverage at [email or web address], password [facility]

## 2017-08-02 DIAGNOSIS — Z8673 Personal history of transient ischemic attack (TIA), and cerebral infarction without residual deficits: Secondary | ICD-10-CM

## 2017-08-02 LAB — GLUCOSE, CAPILLARY
GLUCOSE-CAPILLARY: 105 mg/dL — AB (ref 65–99)
GLUCOSE-CAPILLARY: 89 mg/dL (ref 65–99)
GLUCOSE-CAPILLARY: 106 mg/dL — AB (ref 65–99)
GLUCOSE-CAPILLARY: 80 mg/dL (ref 65–99)
Glucose-Capillary: 120 mg/dL — ABNORMAL HIGH (ref 65–99)
Glucose-Capillary: 92 mg/dL (ref 65–99)
Glucose-Capillary: 97 mg/dL (ref 65–99)

## 2017-08-02 LAB — BASIC METABOLIC PANEL
ANION GAP: 9 (ref 5–15)
BUN: 20 mg/dL (ref 6–20)
CHLORIDE: 100 mmol/L — AB (ref 101–111)
CO2: 30 mmol/L (ref 22–32)
Calcium: 8.6 mg/dL — ABNORMAL LOW (ref 8.9–10.3)
Creatinine, Ser: 1.44 mg/dL — ABNORMAL HIGH (ref 0.44–1.00)
GFR calc Af Amer: 42 mL/min — ABNORMAL LOW (ref >60)
GFR calc non Af Amer: 36 mL/min — ABNORMAL LOW (ref >60)
GLUCOSE: 103 mg/dL — AB (ref 65–99)
POTASSIUM: 3.9 mmol/L (ref 3.5–5.1)
Sodium: 139 mmol/L (ref 135–145)

## 2017-08-02 LAB — C-PEPTIDE: C-Peptide: 5.7 ng/mL — ABNORMAL HIGH (ref 1.1–4.4)

## 2017-08-02 LAB — INSULIN-LIKE GROWTH FACTOR: Somatomedin C: 94 ng/mL (ref 38–163)

## 2017-08-02 MED ORDER — ASPIRIN EC 81 MG PO TBEC
81.0000 mg | DELAYED_RELEASE_TABLET | Freq: Every day | ORAL | Status: DC
  Administered 2017-08-02 – 2017-08-05 (×4): 81 mg via ORAL
  Filled 2017-08-02 (×4): qty 1

## 2017-08-02 MED ORDER — METOPROLOL TARTRATE 25 MG PO TABS
12.5000 mg | ORAL_TABLET | Freq: Two times a day (BID) | ORAL | Status: DC
  Administered 2017-08-03: 12.5 mg via ORAL
  Filled 2017-08-02: qty 1

## 2017-08-02 NOTE — Progress Notes (Signed)
[facility] PROGRESS NOTE  Philomena DohenyCarolyn Kriesel ZOX:[id number] DOB: 1951-09-28 DOA: 07/29/2017  PCP: Caffie DammeSmith, Karla, MD  Brief History/Interval Summary: [age] year old African-American female with a past medical history dementia, type 2 diabetes, hyperlipidemia, hypertension, seizures, previous stroke who presented after having 2 falls at home. Patient had been more drowsy and somnolent in the last few weeks. According to family her antiepileptic medication including Lamictal and Vimpat doses were increased recently. Patient also found to have hypoglycemia. She required D10 infusion.  Reason for Visit: hypoglycemia  Consultants: none  Procedures: none  Antibiotics: none  Subjective/Interval History: Patient poor historian.  ROS: Denies any nausea or vomiting  Objective:  Vital Signs  Vitals:   08/01/17 1335 08/01/17 2052 08/02/17 0423 08/02/17 1026  BP: (!) 90/56 (!) 107/39 (!) 93/44 (!) 110/50  Pulse: 63 73 (!) 59 62  Resp: 18 19 20    Temp: 98.7 F (37.1 C) 98.7 F (37.1 C) 98.7 F (37.1 C)   TempSrc: Oral Oral Oral   SpO2: 98% 97% 99%   Weight:      Height:        Intake/Output Summary (Last 24 hours) at 08/02/17 1334 Last data filed at 08/02/17 0900  Gross per 24 hour  Intake             1610 ml  Output              250 ml  Net             1360 ml   Filed Weights   07/29/17 1214  Weight: 73 kg (161 lb)    General appearance: Awake alert.  In no distress. distracted. Resp: Clear to auscultation bilaterally Cardio: S1-S2 is normal regular.  No S3-S4.  No rubs murmurs or bruit GI: Abdomen remains soft.  Nontender nondistended.  Bowel sounds are present.  No masses organomegaly Extremities: no edema Neurologic: no obvious focal neurological deficits noted. She is disoriented due to her history of dementia  Lab Results:  Data Reviewed: I have personally reviewed following labs and imaging studies  CBC:  Recent Labs Lab 07/29/17 1432 08/01/17 0502  WBC  5.6 5.7  NEUTROABS 3.7  --   HGB 12.4 11.0*  HCT 37.8 35.2*  MCV 92.2 92.4  PLT 268 231    Basic Metabolic Panel:  Recent Labs Lab 07/29/17 1432 07/31/17 0521 08/01/17 0502 08/02/17 0518  NA 141 139 138 139  K 3.7 3.8 3.7 3.9  CL 102 100* 99* 100*  CO2 30 30 30 30   GLUCOSE 84 106* 108* 103*  BUN 9 14 15 20   CREATININE 1.24* 1.13* 1.24* 1.44*  CALCIUM 9.1 8.7* 8.8* 8.6*    GFR: Estimated Creatinine Clearance: 37.4 mL/min (A) (by C-G formula based on SCr of 1.44 mg/dL (H)).  Liver Function Tests:  Recent Labs Lab 07/29/17 1432  AST 19  ALT 19  ALKPHOS 96  BILITOT 0.4  PROT 7.7  ALBUMIN 4.3    Recent Labs Lab 07/29/17 1432  AMMONIA 11    CBG:  Recent Labs Lab 08/01/17 2151 08/02/17 0004 08/02/17 0423 08/02/17 0735 08/02/17 1204  GLUCAP 119* 92 97 105* 89             Radiology Studies: No results found.   Medications:  Scheduled: . aspirin EC  81 mg Oral Daily  . clonazePAM  0.5 mg Oral TID  . feeding supplement (ENSURE ENLIVE)  237 mL Oral TID BM  . folic acid  1 mg Oral  Daily  . heparin  5,000 Units Subcutaneous Q8H  . irbesartan  300 mg Oral Daily   And  . hydrochlorothiazide  25 mg Oral Daily  . lacosamide  150 mg Oral BID  . lamoTRIgine  200 mg Oral BID  . metoprolol tartrate  25 mg Oral BID  . polyethylene glycol  17 g Oral Daily  . potassium chloride  10 mEq Oral Daily  . QUEtiapine  300 mg Oral QHS  . senna-docusate  2 tablet Oral Daily  . traZODone  150 mg Oral QHS   Continuous: . dextrose 50 mL/hr at 08/01/17 2149   PRN:  Assessment/Plan:  Principal Problem:   Hypoglycemia Active Problems:   Hypertension   History of CVA (cerebrovascular accident)   Hyperlipidemia   Seizure (HCC)   Bradycardia    Hypoglycemia Reason for her hypoglycemia is not entirely clear. Patient does not have a diagnosis of diabetes and is not on any glucose lowering agents at home. Initially thought to be due to poor oral intake which  could still be the reason. Patient requires a D10 infusion to prevent hypoglycemia. Will decrease rate and monitor. C-peptide level noted to be 5.7 which is higher than normal range.  Insulin and proinsulin levels are pending.  Beta hydroxybutyrate acid 0.09.  This levels which was normal.  No recent abdominal imaging studies noted in the EMR. Proceed with MRI abdomen to rule out any pancreatic lesions.    Increased somnolence. Probably due to recent dose changes to her antiepileptic medications. Mental status has improved from the time of admission. But she remains disoriented, which could be due to dementia.  History of seizure disorder. Doses of Lamictal and Vimpat were reduced after discussions with neurology.  History of stroke. Not noted to be an aspirin and was started on the same. No focal neurological deficits noted on examination.  History of essential hypertension.  Blood pressure noted to be borderline low.  We will stop her ARB/diuretic. We will reduce the dose of her metoprolol.   DVT Prophylaxis: subcutaneous heparin    Code Status: full code  Family Communication: Discussed with patient's husband Disposition Plan: Continues to require D10.  MRI abdomen ordered.     LOS: 2 days   [facility]  [facility] Pager [phone] 08/02/2017, 1:34 PM  If 7PM-7AM, please contact night-coverage at [email or web address], password [facility]

## 2017-08-02 NOTE — Progress Notes (Signed)
Refused CBG check 

## 2017-08-03 LAB — BASIC METABOLIC PANEL
ANION GAP: 8 (ref 5–15)
BUN: 28 mg/dL — ABNORMAL HIGH (ref 6–20)
CHLORIDE: 101 mmol/L (ref 101–111)
CO2: 30 mmol/L (ref 22–32)
Calcium: 8.6 mg/dL — ABNORMAL LOW (ref 8.9–10.3)
Creatinine, Ser: 1.32 mg/dL — ABNORMAL HIGH (ref 0.44–1.00)
GFR calc Af Amer: 47 mL/min — ABNORMAL LOW (ref >60)
GFR, EST NON AFRICAN AMERICAN: 40 mL/min — AB (ref >60)
Glucose, Bld: 120 mg/dL — ABNORMAL HIGH (ref 65–99)
POTASSIUM: 4.1 mmol/L (ref 3.5–5.1)
Sodium: 139 mmol/L (ref 135–145)

## 2017-08-03 LAB — GLUCOSE, CAPILLARY
GLUCOSE-CAPILLARY: 125 mg/dL — AB (ref 65–99)
GLUCOSE-CAPILLARY: 125 mg/dL — AB (ref 65–99)
GLUCOSE-CAPILLARY: 119 mg/dL — AB (ref 65–99)
Glucose-Capillary: 105 mg/dL — ABNORMAL HIGH (ref 65–99)
Glucose-Capillary: 119 mg/dL — ABNORMAL HIGH (ref 65–99)

## 2017-08-03 LAB — CBC
HCT: 31.4 % — ABNORMAL LOW (ref 36.0–46.0)
Hemoglobin: 10.2 g/dL — ABNORMAL LOW (ref 12.0–15.0)
MCH: 30.5 pg (ref 26.0–34.0)
MCHC: 32.5 g/dL (ref 30.0–36.0)
MCV: 94 fL (ref 78.0–100.0)
PLATELETS: 224 10*3/uL (ref 150–400)
RBC: 3.34 MIL/uL — AB (ref 3.87–5.11)
RDW: 15 % (ref 11.5–15.5)
WBC: 7 10*3/uL (ref 4.0–10.5)

## 2017-08-03 NOTE — Progress Notes (Signed)
Pt has refused CPAP for the night.  RT to monitor and assess as needed.  

## 2017-08-03 NOTE — Progress Notes (Signed)
[facility] PROGRESS NOTE  Angela Fields RUE:[id number] DOB: [birth year] DOA: 07/29/2017  PCP: Caffie Damme, MD  Brief History/Interval Summary: 66 year old African-American female with a past medical history dementia, type 2 diabetes, hyperlipidemia, hypertension, seizures, previous stroke who presented after having 2 falls at home. Patient had been more drowsy and somnolent in the last few weeks. According to family her antiepileptic medication including Lamictal and Vimpat doses were increased recently. Patient also found to have hypoglycemia. She required D10 infusion.  Reason for Visit: hypoglycemia  Consultants: none  Procedures: none  Antibiotics: none  Subjective/Interval History: Patient is a poor historian.  Denies any complaints.  ROS: Denies any nausea or vomiting  Objective:  Vital Signs  Vitals:   08/02/17 1026 08/02/17 1353 08/02/17 2039 08/03/17 0525  BP: (!) 110/50 (!) 104/46 (!) 100/56 (!) 102/55  Pulse: 62 69 65 67  Resp:  16 18 18   Temp:  98.4 F (36.9 C) 98.7 F (37.1 C) 98.6 F (37 C)  TempSrc:  Oral Oral Oral  SpO2:  96% 97% 97%  Weight:      Height:        Intake/Output Summary (Last 24 hours) at 08/03/17 1243 Last data filed at 08/03/17 0900  Gross per 24 hour  Intake          1423.33 ml  Output                0 ml  Net          1423.33 ml   Filed Weights   07/29/17 1214  Weight: 73 kg (161 lb)    General appearance: Awake and alert but distracted.  In no distress. Resp: Clear to auscultation bilaterally Cardio: S1 GI: Abdomen remains soft.  Nontender nondistended.  Bowel sounds are present.  No masses organomegaly Extremities: no edema Neurologic: no obvious focal neurological deficits noted. She is disoriented due to her history of dementia  Lab Results:  Data Reviewed: I have personally reviewed following labs and imaging studies  CBC:  Recent Labs Lab 07/29/17 1432 08/01/17 0502 08/03/17 0546  WBC 5.6 5.7 7.0   NEUTROABS 3.7  --   --   HGB 12.4 11.0* 10.2*  HCT 37.8 35.2* 31.4*  MCV 92.2 92.4 94.0  PLT 268 231 224    Basic Metabolic Panel:  Recent Labs Lab 07/29/17 1432 07/31/17 0521 08/01/17 0502 08/02/17 0518 08/03/17 0546  NA 141 139 138 139 139  K 3.7 3.8 3.7 3.9 4.1  CL 102 100* 99* 100* 101  CO2 30 30 30 30 30   GLUCOSE 84 106* 108* 103* 120*  BUN 9 14 15 20  28*  CREATININE 1.24* 1.13* 1.24* 1.44* 1.32*  CALCIUM 9.1 8.7* 8.8* 8.6* 8.6*    GFR: Estimated Creatinine Clearance: 40.8 mL/min (A) (by C-G formula based on SCr of 1.32 mg/dL (H)).  Liver Function Tests:  Recent Labs Lab 07/29/17 1432  AST 19  ALT 19  ALKPHOS 96  BILITOT 0.4  PROT 7.7  ALBUMIN 4.3    Recent Labs Lab 07/29/17 1432  AMMONIA 11    CBG:  Recent Labs Lab 08/02/17 2055 08/02/17 2356 08/03/17 0521 08/03/17 0732 08/03/17 1132  GLUCAP 80 120* 125* 105* 125*             Radiology Studies: No results found.   Medications:  Scheduled: . aspirin EC  81 mg Oral Daily  . clonazePAM  0.5 mg Oral TID  . feeding supplement (ENSURE ENLIVE)  237 mL  Oral TID BM  . folic acid  1 mg Oral Daily  . heparin  5,000 Units Subcutaneous Q8H  . lacosamide  150 mg Oral BID  . lamoTRIgine  200 mg Oral BID  . metoprolol tartrate  12.5 mg Oral BID  . polyethylene glycol  17 g Oral Daily  . potassium chloride  10 mEq Oral Daily  . QUEtiapine  300 mg Oral QHS  . senna-docusate  2 tablet Oral Daily  . traZODone  150 mg Oral QHS   Continuous: . dextrose 10 mL/hr at 08/02/17 1335   PRN:  Assessment/Plan:  Principal Problem:   Hypoglycemia Active Problems:   Hypertension   History of CVA (cerebrovascular accident)   Hyperlipidemia   Seizure (HCC)   Bradycardia    Hypoglycemia Reason for her hypoglycemia is not entirely clear. Patient does not have a diagnosis of diabetes and is not on any glucose lowering agents at home. Initially thought to be due to poor oral intake which could  still be the reason. Patient required D10 infusion to prevent hypoglycemia. C-peptide level noted to be 5.7 which is higher than normal range.  Insulin and proinsulin levels are pending.  Beta hydroxybutyrate acid 0.09. No recent abdominal imaging studies noted in the EMR. Proceed with MRI abdomen to rule out any pancreatic lesions.  She was taken off of her D10 infusion overnight.  CBGs are stable.  Continue to monitor.  Increased somnolence. Probably due to recent dose changes to her antiepileptic medications. Mental status has improved from the time of admission. But she remains disoriented, which could be due to dementia.  History of seizure disorder. Doses of Lamictal and Vimpat were reduced after discussions with neurology.  History of stroke. Not noted to be an aspirin and was started on the same. No focal neurological deficits noted on examination.  History of obstructive sleep apnea Informed of this diagnosis by her husband today.  She does use a CPAP machine at home.  History of essential hypertension.  Blood pressure noted to be borderline low.  We stopped her ARB/diuretic.  Dose of metoprolol was reduced.  Blood pressure remains borderline low.  We will stop it for now.   DVT Prophylaxis: subcutaneous heparin    Code Status: full code  Family Communication: Discussed with the patient's husband Disposition Plan: Management as outlined above.  MRI abdomen pending.  Physical therapy continues to recommend SNF for short-term rehab due to unsteady gait.  Discussed with husband.  He would like to pursue this option.  We will have social worker reevaluate.   LOS: 3 days   [facility] SystemKRISHNAN,Jasline Buskirk  [facility] Pager [phone] 08/03/2017, 12:43 PM  If 7PM-7AM, please contact night-coverage at [email or web address], password [email or web address]

## 2017-08-04 ENCOUNTER — Inpatient Hospital Stay (HOSPITAL_COMMUNITY): Payer: Medicare HMO

## 2017-08-04 LAB — GLUCOSE, CAPILLARY
GLUCOSE-CAPILLARY: 96 mg/dL (ref 65–99)
GLUCOSE-CAPILLARY: 122 mg/dL — AB (ref 65–99)
GLUCOSE-CAPILLARY: 123 mg/dL — AB (ref 65–99)
Glucose-Capillary: 109 mg/dL — ABNORMAL HIGH (ref 65–99)
Glucose-Capillary: 132 mg/dL — ABNORMAL HIGH (ref 65–99)

## 2017-08-04 NOTE — Progress Notes (Signed)
Pt refused CPAP qhs.  Pt states that she wears cpap at home but does not want to wear it while in the hospital.  Pt education provided but still refused.  Pt encouraged to contact RT if she should change her mind.

## 2017-08-04 NOTE — Progress Notes (Signed)
[facility] PROGRESS NOTE  Angela DohenyCarolyn Fields WUJ:[id number] DOB: June 29, 1951 DOA: 07/29/2017  PCP: Caffie DammeSmith, Karla, MD  Brief History/Interval Summary: 66 year old African-American female with a past medical history dementia, type 2 diabetes, hyperlipidemia, hypertension, seizures, previous stroke who presented after having 2 falls at home. Patient had been more drowsy and somnolent in the last few weeks. According to family her antiepileptic medication including Lamictal and Vimpat doses were increased recently. Patient also found to have hypoglycemia. She required D10 infusion for many days.  Finally taken off of it about 36 hours ago.  Reason for Visit: hypoglycemia  Consultants: none  Procedures: none  Antibiotics: none  Subjective/Interval History: Patient remains a poor historian.  She appears to be comfortable.  Not showing any signs of discomfort.  She denies any complaints.  ROS: Denies any nausea or vomiting  Objective:  Vital Signs  Vitals:   08/03/17 0525 08/03/17 1346 08/03/17 2034 08/04/17 0411  BP: (!) 102/55 (!) 116/40 (!) 110/53 (!) 94/45  Pulse: 67 66 83 77  Resp: 18 18 18 18   Temp: 98.6 F (37 C) (!) 97.5 F (36.4 C) 98.5 F (36.9 C) 98.3 F (36.8 C)  TempSrc: Oral Oral Oral Oral  SpO2: 97% 100% 100% 99%  Weight:      Height:        Intake/Output Summary (Last 24 hours) at 08/04/17 1037 Last data filed at 08/04/17 0600  Gross per 24 hour  Intake              680 ml  Output                0 ml  Net              680 ml   Filed Weights   07/29/17 1214  Weight: 73 kg (161 lb)    General appearance: Awake alert.  In no distress Resp: Clear to auscultation bilaterally Cardio: S1-S2 is normal regular.  No S3-S4.  No rubs murmurs or bruit GI: Abdomen remains soft.  Nontender nondistended.  Bowel sounds are present.  No masses organomegaly  Extremities: no edema Neurologic: no obvious focal neurological deficits noted. She is disoriented due to  her history of dementia  Lab Results:  Data Reviewed: I have personally reviewed following labs and imaging studies  CBC:  Recent Labs Lab 07/29/17 1432 08/01/17 0502 08/03/17 0546  WBC 5.6 5.7 7.0  NEUTROABS 3.7  --   --   HGB 12.4 11.0* 10.2*  HCT 37.8 35.2* 31.4*  MCV 92.2 92.4 94.0  PLT 268 231 224    Basic Metabolic Panel:  Recent Labs Lab 07/29/17 1432 07/31/17 0521 08/01/17 0502 08/02/17 0518 08/03/17 0546  NA 141 139 138 139 139  K 3.7 3.8 3.7 3.9 4.1  CL 102 100* 99* 100* 101  CO2 30 30 30 30 30   GLUCOSE 84 106* 108* 103* 120*  BUN 9 14 15 20  28*  CREATININE 1.24* 1.13* 1.24* 1.44* 1.32*  CALCIUM 9.1 8.7* 8.8* 8.6* 8.6*    GFR: Estimated Creatinine Clearance: 40.8 mL/min (A) (by C-G formula based on SCr of 1.32 mg/dL (H)).  Liver Function Tests:  Recent Labs Lab 07/29/17 1432  AST 19  ALT 19  ALKPHOS 96  BILITOT 0.4  PROT 7.7  ALBUMIN 4.3    Recent Labs Lab 07/29/17 1432  AMMONIA 11    CBG:  Recent Labs Lab 08/03/17 1642 08/03/17 2033 08/04/17 0021 08/04/17 0407 08/04/17 0724  GLUCAP 119* 119* 132*  109* 96             Radiology Studies: No results found.   Medications:  Scheduled: . aspirin EC  81 mg Oral Daily  . clonazePAM  0.5 mg Oral TID  . feeding supplement (ENSURE ENLIVE)  237 mL Oral TID BM  . folic acid  1 mg Oral Daily  . heparin  5,000 Units Subcutaneous Q8H  . lacosamide  150 mg Oral BID  . lamoTRIgine  200 mg Oral BID  . polyethylene glycol  17 g Oral Daily  . potassium chloride  10 mEq Oral Daily  . QUEtiapine  300 mg Oral QHS  . senna-docusate  2 tablet Oral Daily  . traZODone  150 mg Oral QHS   Continuous: . dextrose Stopped (08/03/17 0700)   PRN:  Assessment/Plan:  Principal Problem:   Hypoglycemia Active Problems:   Hypertension   History of CVA (cerebrovascular accident)   Hyperlipidemia   Seizure (HCC)   Bradycardia    Hypoglycemia Reason for her hypoglycemia was not entirely  clear. Patient does not have a diagnosis of diabetes and is not on any glucose lowering agents at home. Initially thought to be due to poor oral intake which could still be the reason. Patient required D10 infusion to prevent hypoglycemia. C-peptide level noted to be 5.7 which is higher than normal range.  Insulin and proinsulin levels are pending.  Beta hydroxybutyrate acid 0.09. No recent abdominal imaging studies noted in the EMR. MRI abdomen to rule out any pancreatic lesions.  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  Blood glucose levels remain stable off of D10.  Continue to encourage oral intake.  Increased somnolence/history of dementia Now resolved.  This was probably due to recent dose changes to her antiepileptic medications. But she remains disoriented, which could be due to dementia.  Seen by physical therapy who recommends short-term rehab.  Discussed with the patient's husband who is agreeable to same if insurance would cover.  Social worker to look into this today.  History of seizure disorder. Doses of Lamictal and Vimpat were reduced after discussions with neurology.  History of stroke. Not noted to be an aspirin and was started on the same. No focal neurological deficits noted on examination.  History of obstructive sleep apnea She does use a CPAP machine at home.  History of essential hypertension.  Blood pressure noted to be borderline low.  We stopped her ARB/diuretic and metoprolol.   DVT Prophylaxis: subcutaneous heparin    Code Status: full code  Family Communication: Discussed with the patient's husband Disposition Plan: Management as outlined above.  MRI abdomen pending.     LOS: 4 days   [facility]  [facility] Pager [phone] 08/04/2017, 10:37 AM  If 7PM-7AM, please contact night-coverage at [email or web address], password [facility]

## 2017-08-04 NOTE — Care Management Important Message (Signed)
Important Message  Patient Details  Name: Angela Fields MRN: [id number] Date of Birth: [birth year]   Medicare Important Message Given:  Yes    Caren MacadamFuller, Riya Huxford 08/04/2017, 1:04 PMImportant Message  Patient Details  Name: Angela Fields MRN: [id number] Date of Birth: [birth year]   Medicare Important Message Given:  Yes    Caren MacadamFuller, Ahmad Vanwey 08/04/2017, 1:04 PM

## 2017-08-04 NOTE — Progress Notes (Signed)
Physical Therapy Treatment Patient Details Name: Angela DohenyCarolyn Genco MRN: [id number] DOB: Jul 20, 1951 Today's Date: 08/04/2017    History of Present Illness Angela DohenyCarolyn Jabs is a 66 y.o. female with medical history significant of a stroke with aphasia and right hemianopsia, hypertension, hyperlipidemia, seizure disorder admitted through ED s/p falls and with AMS.    PT Comments    Pt very happy to be ambulating.  Pt continues to remain a high fall risk due to her unsteady gait and multiple instances of LOB during session.  Attempted to provide pt with RW prior to ambulating however she declines use of assistive device.      Follow Up Recommendations  SNF;Supervision/Assistance - 24 hour     Equipment Recommendations  None recommended by PT    Recommendations for Other Services       Precautions / Restrictions Precautions Precautions: Fall Precaution Comments: hx CVA and presents with expressive aphasia    Mobility  Bed Mobility Overal bed mobility: Needs Assistance Bed Mobility: Supine to Sit;Sit to Supine     Supine to sit: Supervision Sit to supine: Supervision   General bed mobility comments: supervision for safety  Transfers Overall transfer level: Needs assistance Equipment used: None Transfers: Sit to/from Stand Sit to Stand: Min guard         General transfer comment: min/guard for safety, verbal cues for technique  Ambulation/Gait Ambulation/Gait assistance: Min assist Ambulation Distance (Feet): 400 Feet Assistive device: None Gait Pattern/deviations: Step-through pattern;Decreased stride length     General Gait Details: provided RW for steadying assist however pt pushed RW assist (does not prefer to use despite unsteadiness), min assist for unsteadiness, LOB x3 with standing activities   Stairs            Wheelchair Mobility    Modified Rankin (Stroke Patients Only)       Balance                               High Level  Balance Comments: performed head turns for 40 feet during ambulation with assist required for balance, able to walk backwards 20 feet min/guard            Cognition Arousal/Alertness: Awake/alert Behavior During Therapy: WFL for tasks assessed/performed Overall Cognitive Status: History of cognitive impairments - at baseline                                 General Comments: pleasant, following commands, able to state date with increased time      Exercises      General Comments        Pertinent Vitals/Pain Pain Assessment: No/denies pain    Home Living                      Prior Function            PT Goals (current goals can now be found in the care plan section) Progress towards PT goals: Progressing toward goals    Frequency    Min 3X/week      PT Plan Current plan remains appropriate    Co-evaluation              AM-PAC PT "6 Clicks" Daily Activity  Outcome Measure  Difficulty turning over in bed (including adjusting bedclothes, sheets and blankets)?: None Difficulty moving from lying on back to sitting  on the side of the bed? : A Little Difficulty sitting down on and standing up from a chair with arms (e.g., wheelchair, bedside commode, etc,.)?: A Little Help needed moving to and from a bed to chair (including a wheelchair)?: A Little Help needed walking in hospital room?: A Little Help needed climbing 3-5 steps with a railing? : A Little 6 Click Score: 19    End of Session Equipment Utilized During Treatment: Gait belt Activity Tolerance: Patient tolerated treatment well Patient left: in bed;with call bell/phone within reach;with bed alarm set   PT Visit Diagnosis: Unsteadiness on feet (R26.81);Difficulty in walking, not elsewhere classified (R26.2)     Time: 1914-7829 PT Time Calculation (min) (ACUTE ONLY): 22 min  Charges:  $Gait Training: 8-22 mins                    G Codes:      Zenovia Jarred, PT,  DPT 08/04/2017 Pager: [phone]  Maida Sale E 08/04/2017, 12:40 PM

## 2017-08-04 NOTE — Progress Notes (Signed)
CSW contacted by patient's attending MD and informed that patient's family is interested in SNF.   CSW spoke with patient and patient's husband about SNF recommendation. Patient's husband reported that he is agreeable if insurance will pay for SNF.   Patient at this time does not qualify for SNF placement as she is walking 400 Feet.  Her insurance is [employer] which requires prior authorization and with the amount walking she will not qualify for this level of care covered under her benefit.   CSW informed patient's husband that he has the option to private pay for SNF. Patient's husband reported that he is unable to private pay. Patient's husband verbalized understanding of plan for patient to discharge home with home health. CSW will update patient's RNCM.  CSW signing off, no other needs identified at this time.   Celso SickleKimberly Chrishun Scheer, [place]WA Clinical Social Worker [facility] Cell#: [phone]

## 2017-08-04 NOTE — Progress Notes (Signed)
Occupational Therapy Treatment Patient Details Name: Angela Fields MRN: [id number] DOB: 1950/12/22 Today's Date: 08/04/2017    History of present illness Angela Fields is a 66 y.o. female with medical history significant of a stroke with aphasia and right hemianopsia, hypertension, hyperlipidemia, seizure disorder admitted through ED s/p falls and with AMS.   OT comments  Pt will need SNF unless family able to provide 24/7 A  Follow Up Recommendations  Supervision/Assistance - 24 hour;Home health OT;SNF    Equipment Recommendations  3 in 1 bedside commode    Recommendations for Other Services      Precautions / Restrictions Precautions Precautions: Fall Precaution Comments: hx CVA and presents with expressive aphasia       Mobility Bed Mobility Overal bed mobility: Needs Assistance Bed Mobility: Supine to Sit;Sit to Supine     Supine to sit: Supervision Sit to supine: Supervision   General bed mobility comments: supervision for safety  Transfers Overall transfer level: Needs assistance Equipment used: None Transfers: Sit to/from Stand Sit to Stand: Min guard         General transfer comment: min/guard for safety, verbal cues for technique        ADL either performed or assessed with clinical judgement   ADL   Eating/Feeding: Set up;Supervision/ safety;Sitting   Grooming: Wash/dry hands;Wash/dry face;Brushing hair;Sitting;Set up                   Toilet Transfer: Minimal assistance;Ambulation;Comfort height toilet   Toileting- Clothing Manipulation and Hygiene: Sit to/from stand;Minimal assistance                         Cognition Arousal/Alertness: Awake/alert Behavior During Therapy: WFL for tasks assessed/performed Overall Cognitive Status: History of cognitive impairments - at baseline                                 General Comments: pleasant, following commands, able to state date with increased time                  Pertinent Vitals/ Pain       Pain Assessment: No/denies pain     Prior Functioning/Environment              Frequency  Min 2X/week        Progress Toward Goals  OT Goals(current goals can now be found in the care plan section)  Progress towards OT goals: Progressing toward goals     Plan Discharge plan remains appropriate    Co-evaluation                 AM-PAC PT "6 Clicks" Daily Activity     Outcome Measure   Help from another person eating meals?: A Little Help from another person taking care of personal grooming?: A Little Help from another person toileting, which includes using toliet, bedpan, or urinal?: A Little Help from another person bathing (including washing, rinsing, drying)?: A Little Help from another person to put on and taking off regular upper body clothing?: A Little Help from another person to put on and taking off regular lower body clothing?: A Little 6 Click Score: 18    End of Session    OT Visit Diagnosis: Unsteadiness on feet (R26.81)   Activity Tolerance Patient tolerated treatment well   Patient Left in chair;with call bell/phone within reach;with chair alarm set   Nurse Communication  Time: 1300-1311 OT Time Calculation (min): 11 min  Charges: OT General Charges $OT Visit: 1 Visit OT Treatments $Self Care/Home Management : 8-22 mins  StevensLori Kylil Swopes, [place]OT [phone]   Alba CoryREDDING, Kataleya Zaugg D 08/04/2017, 1:32 PM

## 2017-08-05 LAB — GLUCOSE, CAPILLARY
GLUCOSE-CAPILLARY: 110 mg/dL — AB (ref 65–99)
GLUCOSE-CAPILLARY: 119 mg/dL — AB (ref 65–99)
GLUCOSE-CAPILLARY: 82 mg/dL (ref 65–99)
GLUCOSE-CAPILLARY: 86 mg/dL (ref 65–99)
GLUCOSE-CAPILLARY: 98 mg/dL (ref 65–99)
Glucose-Capillary: 98 mg/dL (ref 65–99)

## 2017-08-05 LAB — PROINSULIN/INSULIN RATIO
Insulin: 16 u[IU]/mL
Proinsulin/Insulin Ratio: 29 %
Proinsulin: 31 pmol/L

## 2017-08-05 MED ORDER — LAMOTRIGINE 200 MG PO TABS: 200.0000 mg | ORAL_TABLET | Freq: Two times a day (BID) | ORAL | 2 refills | Status: DC

## 2017-08-05 MED ORDER — LACOSAMIDE 150 MG PO TABS: 150.0000 mg | ORAL_TABLET | Freq: Two times a day (BID) | ORAL | 2 refills | Status: DC

## 2017-08-05 MED ORDER — ENSURE ENLIVE PO LIQD: 237.0000 mL | Freq: Three times a day (TID) | ORAL | 12 refills | Status: DC

## 2017-08-05 MED ORDER — ASPIRIN 81 MG PO TBEC: 81.0000 mg | DELAYED_RELEASE_TABLET | Freq: Every day | ORAL | 0 refills | Status: AC

## 2017-08-05 NOTE — Care Management Note (Signed)
Case Management Note  Patient Details  Name: Philomena DohenyCarolyn Karnik MRN: [id number] Date of Birth: 1951-07-13  Subjective/Objective: Noted recc SNF-unable to private pay.HHC ordered. Spoke to patient in rm/spouse on phone about HHC agency choice-chose [facility]-rep Clydie BraunKaren aware of orders, & d/c today. No further CM needs.                  Action/Plan:d/c home w/HHC.   Expected Discharge Date:  08/05/17               Expected Discharge Plan:  Home w Home Health Services  In-House Referral:  Clinical Social Work  Discharge planning Services  CM Consult  Post Acute Care Choice:  Home Health Choice offered to:  Spouse  DME Arranged:    DME Agency:     HH Arranged:  RN, PT, OT, Nurse's Aide, Social Work [employer] Agency:  [facility]  Status of Service:  Completed, signed off  If discussed at [employer] Length of [employer] Meetings, dates discussed:    Additional Comments:  Lanier ClamMahabir, Mikahla Wisor, RN 08/05/2017, 11:00 AM

## 2017-08-05 NOTE — Discharge Summary (Signed)
[facility]  Physician Discharge Summary   Patient ID: Angela Fields MRN: [id number] DOB/AGE: 1951/02/03 66 y.o.  Admit date: 07/29/2017 Discharge date: 08/05/2017  PCP: Caffie Damme, MD  DISCHARGE DIAGNOSES:  Principal Problem:   Hypoglycemia Active Problems:   Hypertension   History of CVA (cerebrovascular accident)   Hyperlipidemia   Seizure (HCC)   Bradycardia   RECOMMENDATIONS FOR OUTPATIENT FOLLOW UP: 1. Home health has been ordered. 2. Consider MRI abdomen with and without contrast to rule out pancreatic lesions as a reason for her hypoglycemia 3. Antihypertensives have been discontinued.  DISCHARGE CONDITION: fair  Diet recommendation: Regular as tolerated  Filed Weights   07/29/17 1214  Weight: 73 kg (161 lb)    INITIAL HISTORY: 66 year old African-American female with a past medical history dementia, type 2 diabetes, hyperlipidemia, hypertension, seizures, previous stroke who presented after having 2 falls at home. Patient had been more drowsy and somnolent in the last few weeks. According to family her antiepileptic medication including Lamictal and Vimpat doses were increased recently. Patient also found to have hypoglycemia. She required D10 infusion for many days.  Finally taken off of it about 36 hours ago.   HOSPITAL COURSE:   Hypoglycemia Reason for her hypoglycemia was not entirely clear. Patient does not have a diagnosis of diabetes and is not on any glucose lowering agents at home. Initially thought to be due to poor oral intake which could still be the reason. Patient required D10 infusion to prevent hypoglycemia. C-peptide level noted to be 5.7 which is higher than normal range.  Insulin and proinsulin levels are pending.  Beta hydroxybutyrate acid 0.09. No recent abdominal imaging studies noted in the EMR. MRI abdomen was ordered to rule out any pancreatic lesions.  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  So radiologist did not approve this MRI in this patient at this time.  It was communicated to them that she had MRI of her brain back in April at [facility].  They tried calling there to to get details however were unable to do so.  Primary care physician can pursue this further. Her CBGs have stabilized in the meantime.  She has been off of D10 infusion for more than 48 hours.  Patient and husband told the importance of adequate oral intake.  Ensure supplements 3 times a day.    Increased somnolence/history of dementia Increased somnolence resolved.  This was probably due to recent dose changes to her antiepileptic medications. But she remains disoriented, which could be due to dementia.  Seen by physical therapy who recommends short-term rehab.    Unfortunately insurance did not cover this.  Home health has been ordered.  History of seizure disorder. No seizure activity noted during this hospitalization.  To new home medications.  Dose of lacosamide was lowered per neurology recommendation.  History of stroke. Not noted to be an aspirin and was started on the same. No focal neurological deficits noted on examination.  History of obstructive sleep apnea She does use a CPAP machine at home.  History of essential hypertension.  Blood pressure noted to be borderline low.  We stopped her ARB/diuretic and metoprolol.  Blood pressure has stabilized.  Continue these medications.  Overall stable.  Okay for discharge home today with home health.    PERTINENT LABS:  The results of significant diagnostics from this hospitalization (including imaging, microbiology, ancillary and laboratory) are listed below  for reference.     Labs: Basic Metabolic Panel:  Recent Labs Lab 07/29/17 1432 07/31/17 0521 08/01/17 0502 08/02/17 0518 08/03/17 0546  NA 141 139 138 139 139  K 3.7 3.8 3.7 3.9 4.1  CL 102 100* 99* 100* 101  CO2 30 30 30 30  30   GLUCOSE 84 106* 108* 103* 120*  BUN 9 14 15 20  28*  CREATININE 1.24* 1.13* 1.24* 1.44* 1.32*  CALCIUM 9.1 8.7* 8.8* 8.6* 8.6*   Liver Function Tests:  Recent Labs Lab 07/29/17 1432  AST 19  ALT 19  ALKPHOS 96  BILITOT 0.4  PROT 7.7  ALBUMIN 4.3    Recent Labs Lab 07/29/17 1432  AMMONIA 11   CBC:  Recent Labs Lab 07/29/17 1432 08/01/17 0502 08/03/17 0546  WBC 5.6 5.7 7.0  NEUTROABS 3.7  --   --   HGB 12.4 11.0* 10.2*  HCT 37.8 35.2* 31.4*  MCV 92.2 92.4 94.0  PLT 268 231 224    CBG:  Recent Labs Lab 08/04/17 2043 08/04/17 2348 08/05/17 0411 08/05/17 0723 08/05/17 1141  GLUCAP 98 110* 98 86 119*     IMAGING STUDIES Dg Chest 2 View  Result Date: 07/29/2017 CLINICAL DATA:  Pain following fall EXAM: CHEST  2 VIEW COMPARISON:  None. FINDINGS: There is no edema or consolidation. Heart is upper normal in size with pulmonary vascularity within normal limits. There is aortic atherosclerosis. No evident adenopathy. No bone lesions. IMPRESSION: Aortic atherosclerosis.  No edema or consolidation. Aortic Atherosclerosis (ICD10-I70.0). Electronically Signed   By: Bretta BangWilliam  Woodruff III M.D.   On: 07/29/2017 14:19   Ct Head Wo Contrast  Result Date: 07/29/2017 CLINICAL DATA:  Fall, altered mental status, dementia EXAM: CT HEAD WITHOUT CONTRAST CT CERVICAL SPINE WITHOUT CONTRAST TECHNIQUE: Multidetector CT imaging of the head and cervical spine was performed following the standard protocol without intravenous contrast. Multiplanar CT image reconstructions of the cervical spine were also generated. COMPARISON:  CT head dated 06/25/2017 FINDINGS: CT HEAD FINDINGS Brain: No evidence of acute infarction, hemorrhage, hydrocephalus, extra-axial collection or mass lesion/mass effect. 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 Secondary ex vacuo dilatation of the occipital horn of the left lateral ventricle. Subcortical white matter and  periventricular small vessel ischemic changes. 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 Skull: Normal. Negative for fracture or focal lesion. Sinuses/Orbits: The visualized paranasal sinuses are essentially clear. The mastoid air cells are unopacified. Other: None. CT CERVICAL SPINE FINDINGS Alignment: Mild straightening of the cervical spine. Skull base and vertebrae: No acute fracture. No primary bone lesion or focal pathologic process. Soft tissues and spinal canal: No prevertebral fluid or swelling. No visible canal hematoma. Disc levels: Mild-to-moderate degenerative changes of the upper cervical spine. Spinal canal is patent. Upper chest: Visualized lung apices are clear. Other: Visualized thyroid is unremarkable. IMPRESSION: No evidence of acute intracranial abnormality. 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 No evidence of traumatic injury to the cervical spine. Mild to moderate degenerative changes. Electronically Signed   By: Charline BillsSriyesh  Demere Dotzler M.D.   On: 07/29/2017 14:06   Ct Cervical Spine Wo Contrast  Result Date: 07/29/2017 CLINICAL DATA:  Fall, altered mental status, dementia EXAM: CT HEAD WITHOUT CONTRAST CT CERVICAL SPINE WITHOUT CONTRAST TECHNIQUE: Multidetector CT imaging of the head and cervical spine was performed following the standard protocol without intravenous contrast. Multiplanar CT image reconstructions of the cervical  spine were also generated. COMPARISON:  CT head dated 06/25/2017 FINDINGS: CT HEAD FINDINGS Brain: No evidence of acute infarction, hemorrhage, hydrocephalus, extra-axial collection or mass lesion/mass effect. 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 Secondary ex vacuo dilatation of the occipital horn of the left lateral ventricle. Subcortical white matter and periventricular small vessel ischemic  changes. 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 Skull: Normal. Negative for fracture or focal lesion. Sinuses/Orbits: The visualized paranasal sinuses are essentially clear. The mastoid air cells are unopacified. Other: None. CT CERVICAL SPINE FINDINGS Alignment: Mild straightening of the cervical spine. Skull base and vertebrae: No acute fracture. No primary bone lesion or focal pathologic process. Soft tissues and spinal canal: No prevertebral fluid or swelling. No visible canal hematoma. Disc levels: Mild-to-moderate degenerative changes of the upper cervical spine. Spinal canal is patent. Upper chest: Visualized lung apices are clear. Other: Visualized thyroid is unremarkable. IMPRESSION: No evidence of acute intracranial abnormality. 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 No evidence of traumatic injury to the cervical spine. Mild to moderate degenerative changes. Electronically Signed   By: Charline Bills M.D.   On: 07/29/2017 14:06    DISCHARGE EXAMINATION: Vitals:   08/04/17 0411 08/04/17 1506 08/04/17 2007 08/05/17 0418  BP: (!) 94/45 (!) 114/53 (!) 119/57 (!) 108/40  Pulse: 77 83 78 72  Resp: 18 19 18 18   Temp: 98.3 F (36.8 C) 98.2 F (36.8 C) 98.1 F (36.7 C) 98.1 F (36.7 C)  TempSrc: Oral Oral Oral Oral  SpO2: 99% 99% 95% 95%  Weight:      Height:       General appearance: alert, cooperative, distracted and no distress Resp: clear to auscultation bilaterally Cardio: regular rate and rhythm, S1, S2 normal, no murmur, click, rub or gallop GI: soft, non-tender; bowel sounds normal; no masses,  no organomegaly  DISPOSITION: Home with husband.  Home health  Discharge Instructions    Call MD for:  difficulty breathing, headache or visual disturbances    Complete by:  As directed    Call MD for:  extreme fatigue    Complete by:  As  directed    Call MD for:  persistant dizziness or light-headedness    Complete by:  As directed    Call MD for:  persistant nausea and vomiting    Complete by:  As directed    Call MD for:  severe uncontrolled pain    Complete by:  As directed    Call MD for:  temperature >100.4    Complete by:  As directed    Diet general    Complete by:  As directed    Discharge instructions    Complete by:  As directed    Please be sure to follow-up with your primary care provider in 1-2 weeks.  Please maintain good oral intake to avoid further drop in glucose levels.  You were cared for by a hospitalist during your hospital stay. If you have any questions about your discharge medications or the care you received while you were in the hospital after you are discharged, you can call the unit and asked to speak with the hospitalist on call if the hospitalist that took care of you is not available. Once you are discharged, your primary care physician will handle any further medical issues. Please note that NO REFILLS for any discharge medications will be authorized  once you are discharged, as it is imperative that you return to your primary care physician (or establish a relationship with a primary care physician if you do not have one) for your aftercare needs so that they can reassess your need for medications and monitor your lab values. If you do not have a primary care physician, you can call [phone] for a physician referral.   Increase activity slowly    Complete by:  As directed       ALLERGIES: No Known Allergies   Current Discharge Medication List    START taking these medications   Details  aspirin EC 81 MG EC tablet Take 1 tablet (81 mg total) by mouth daily. Qty: 30 tablet, Refills: 0    feeding supplement, ENSURE ENLIVE, (ENSURE ENLIVE) LIQD Take 237 mLs by mouth 3 (three) times daily between meals. Qty: 90 Bottle, Refills: 12      CONTINUE these medications which have CHANGED    Details  lacosamide 150 MG TABS Take 1 tablet (150 mg total) by mouth 2 (two) times daily. Qty: 60 tablet, Refills: 2    lamoTRIgine (LAMICTAL) 200 MG tablet Take 1 tablet (200 mg total) by mouth 2 (two) times daily. Qty: 60 tablet, Refills: 2      CONTINUE these medications which have NOT CHANGED   Details  acetaminophen (TYLENOL 8 HOUR ARTHRITIS PAIN) 650 MG CR tablet Take 650 mg by mouth every 4 (four) hours as needed for pain.    clonazePAM (KLONOPIN) 1 MG tablet Take 0.5 mg by mouth 3 (three) times daily. Refills: 0    folic acid (FOLVITE) 1 MG tablet Take 1 mg by mouth daily.    polyethylene glycol (MIRALAX / GLYCOLAX) packet Take 17 g by mouth daily.    QUEtiapine (SEROQUEL XR) 300 MG 24 hr tablet Take 300 mg by mouth at bedtime.    sennosides-docusate sodium (SENOKOT-S) 8.6-50 MG tablet Take 2 tablets by mouth daily.    traZODone (DESYREL) 50 MG tablet Take 1 tablet (50 mg total) by mouth at bedtime. Qty: 30 tablet, Refills: 0    white petrolatum ointment Apply 1 application topically 3 (three) times daily as needed for dry skin.    pravastatin (PRAVACHOL) 10 MG tablet Take 10 mg by mouth at bedtime.       STOP taking these medications     metoprolol tartrate (LOPRESSOR) 25 MG tablet      potassium chloride (K-DUR) 10 MEQ tablet      valsartan-hydrochlorothiazide (DIOVAN-HCT) 320-25 MG tablet         Follow-up Information    [facility] Follow up.   Why:  RN/PT/OT/CSW/aide Contact information: [address] [place] [address] [phone]        Caffie Damme, MD. Schedule an appointment as soon as possible for a visit in 1 week(s).   Specialty:  Family Medicine Why:  Consider getting an outpatient MRI of abdomen with and without contrast to look for lesions in the pancreas.  See discharge summary for details. Contact information: [address] [facility] [place] [address] [phone]           TOTAL DISCHARGE TIME: 35  minutes  [facility]  [facility] Pager [phone]  08/05/2017, 11:50 AM

## 2017-08-05 NOTE — Discharge Instructions (Signed)
Hypoglycemia Hypoglycemia is when the sugar (glucose) level in the blood is too low. Symptoms of low blood sugar may include:  Feeling: ? Hungry. ? Worried or nervous (anxious). ? Sweaty and clammy. ? Confused. ? Dizzy. ? Sleepy. ? Sick to your stomach (nauseous).  Having: ? A fast heartbeat. ? A headache. ? A change in your vision. ? Jerky movements that you cannot control (seizure). ? Nightmares. ? Tingling or no feeling (numbness) around the mouth, lips, or tongue.  Having trouble with: ? Talking. ? Paying attention (concentrating). ? Moving (coordination). ? Sleeping.  Shaking.  Passing out (fainting).  Getting upset easily (irritability).  Low blood sugar can happen to people who have diabetes and people who do not have diabetes. Low blood sugar can happen quickly, and it can be an emergency. Treating Low Blood Sugar Low blood sugar is often treated by eating or drinking something sugary right away. If you can think clearly and swallow safely, follow the 15:15 rule:  Take 15 grams of a fast-acting carb (carbohydrate). Some fast-acting carbs are: ? 1 tube of glucose gel. ? 3 sugar tablets (glucose pills). ? 6-8 pieces of hard candy. ? 4 oz (120 mL) of fruit juice. ? 4 oz (120 mL) of regular (not diet) soda.  Check your blood sugar 15 minutes after you take the carb.  If your blood sugar is still at or below 70 mg/dL (3.9 mmol/L), take 15 grams of a carb again.  If your blood sugar does not go above 70 mg/dL (3.9 mmol/L) after 3 tries, get help right away.  After your blood sugar goes back to normal, eat a meal or a snack within 1 hour.  Treating Very Low Blood Sugar If your blood sugar is at or below 54 mg/dL (3 mmol/L), you have very low blood sugar (severe hypoglycemia). This is an emergency. Do not wait to see if the symptoms will go away. Get medical help right away. Call your local emergency services (911 in the [place] Do not drive yourself to the  hospital. If you have very low blood sugar and you cannot eat or drink, you may need a glucagon shot (injection). A family member or friend should learn how to check your blood sugar and how to give you a glucagon shot. Ask your doctor if you need to have a glucagon shot kit at home. Follow these instructions at home: General instructions  Avoid any diets that cause you to not eat enough food. Talk with your doctor before you start any new diet.  Take over-the-counter and prescription medicines only as told by your doctor.  Limit alcohol to no more than 1 drink per day for nonpregnant women and 2 drinks per day for men. One drink equals 12 oz of beer, 5 oz of wine, or 1 oz of hard liquor.  Keep all follow-up visits as told by your doctor. This is important. If You Have Diabetes:   Make sure you know the symptoms of low blood sugar.  Always keep a source of sugar with you, such as: ? Sugar. ? Sugar tablets. ? Glucose gel. ? Fruit juice. ? Regular soda (not diet soda). ? Milk. ? Hard candy. ? Honey.  Take your medicines as told.  Follow your exercise and meal plan. ? Eat on time. Do not skip meals. ? Follow your sick day plan when you cannot eat or drink normally. Make this plan ahead of time with your doctor.  Check your blood sugar as often  as told by your doctor. Always check before and after exercise.  Share your diabetes care plan with: ? Your work or school. ? People you live with.  Check your pee (urine) for ketones: ? When you are sick. ? As told by your doctor.  Carry a card or wear jewelry that says you have diabetes. If You Have Low Blood Sugar From Other Causes:   Check your blood sugar as often as told by your doctor.  Follow instructions from your doctor about what you cannot eat or drink. Contact a doctor if:  You have trouble keeping your blood sugar in your target range.  You have low blood sugar often. Get help right away if:  You still have  symptoms after you eat or drink something sugary.  Your blood sugar is at or below 54 mg/dL (3 mmol/L).  You have jerky movements that you cannot control.  You pass out. These symptoms may be an emergency. Do not wait to see if the symptoms will go away. Get medical help right away. Call your local emergency services (911 in the [place] Do not drive yourself to the hospital. This information is not intended to replace advice given to you by your health care provider. Make sure you discuss any questions you have with your health care provider. Document Released: 12/25/2009 Document Revised: 03/07/2016 Document Reviewed: 11/03/2015 Elsevier Interactive Patient Education  [employer].

## 2017-11-06 ENCOUNTER — Encounter (HOSPITAL_COMMUNITY): Payer: Self-pay | Admitting: Nurse Practitioner

## 2017-11-06 ENCOUNTER — Emergency Department (HOSPITAL_COMMUNITY)
Admission: EM | Admit: 2017-11-06 | Discharge: 2017-11-07 | Disposition: A | Discharge status: Home / Self Care | Payer: Medicare HMO | Attending: Emergency Medicine | Admitting: Emergency Medicine

## 2017-11-06 ENCOUNTER — Emergency Department (HOSPITAL_COMMUNITY): Payer: Medicare HMO

## 2017-11-06 ENCOUNTER — Other Ambulatory Visit: Payer: Self-pay

## 2017-11-06 DIAGNOSIS — R413 Other amnesia: Secondary | ICD-10-CM | POA: Diagnosis not present

## 2017-11-06 DIAGNOSIS — Z7982 Long term (current) use of aspirin: Secondary | ICD-10-CM | POA: Insufficient documentation

## 2017-11-06 DIAGNOSIS — Z8673 Personal history of transient ischemic attack (TIA), and cerebral infarction without residual deficits: Secondary | ICD-10-CM

## 2017-11-06 DIAGNOSIS — R4689 Other symptoms and signs involving appearance and behavior: Secondary | ICD-10-CM

## 2017-11-06 DIAGNOSIS — F0391 Unspecified dementia with behavioral disturbance: Secondary | ICD-10-CM | POA: Insufficient documentation

## 2017-11-06 DIAGNOSIS — Z046 Encounter for general psychiatric examination, requested by authority: Secondary | ICD-10-CM | POA: Diagnosis present

## 2017-11-06 DIAGNOSIS — Z79899 Other long term (current) drug therapy: Secondary | ICD-10-CM | POA: Insufficient documentation

## 2017-11-06 DIAGNOSIS — R4587 Impulsiveness: Secondary | ICD-10-CM | POA: Diagnosis not present

## 2017-11-06 DIAGNOSIS — R4189 Other symptoms and signs involving cognitive functions and awareness: Secondary | ICD-10-CM | POA: Diagnosis not present

## 2017-11-06 DIAGNOSIS — Z8659 Personal history of other mental and behavioral disorders: Secondary | ICD-10-CM

## 2017-11-06 DIAGNOSIS — Z008 Encounter for other general examination: Secondary | ICD-10-CM

## 2017-11-06 DIAGNOSIS — Z049 Encounter for examination and observation for unspecified reason: Secondary | ICD-10-CM

## 2017-11-06 LAB — CBG MONITORING, ED: Glucose-Capillary: 77 mg/dL (ref 65–99)

## 2017-11-06 LAB — COMPREHENSIVE METABOLIC PANEL
ALT: 19 U/L (ref 14–54)
ANION GAP: 7 (ref 5–15)
AST: 20 U/L (ref 15–41)
Albumin: 4.5 g/dL (ref 3.5–5.0)
Alkaline Phosphatase: 90 U/L (ref 38–126)
BUN: 11 mg/dL (ref 6–20)
CHLORIDE: 108 mmol/L (ref 101–111)
CO2: 28 mmol/L (ref 22–32)
Calcium: 9.3 mg/dL (ref 8.9–10.3)
Creatinine, Ser: 1.02 mg/dL — ABNORMAL HIGH (ref 0.44–1.00)
GFR calc non Af Amer: 55 mL/min — ABNORMAL LOW (ref >60)
Glucose, Bld: 86 mg/dL (ref 65–99)
POTASSIUM: 3.6 mmol/L (ref 3.5–5.1)
SODIUM: 143 mmol/L (ref 135–145)
Total Bilirubin: 0.2 mg/dL — ABNORMAL LOW (ref 0.3–1.2)
Total Protein: 8.1 g/dL (ref 6.5–8.1)

## 2017-11-06 LAB — CBC
HCT: 40.4 % (ref 36.0–46.0)
HEMOGLOBIN: 13.3 g/dL (ref 12.0–15.0)
MCH: 29.8 pg (ref 26.0–34.0)
MCHC: 32.9 g/dL (ref 30.0–36.0)
MCV: 90.4 fL (ref 78.0–100.0)
PLATELETS: 227 10*3/uL (ref 150–400)
RBC: 4.47 MIL/uL (ref 3.87–5.11)
RDW: 13.5 % (ref 11.5–15.5)
WBC: 6.1 10*3/uL (ref 4.0–10.5)

## 2017-11-06 LAB — URINALYSIS, ROUTINE W REFLEX MICROSCOPIC
BILIRUBIN URINE: NEGATIVE
Bacteria, UA: NONE SEEN
Glucose, UA: NEGATIVE mg/dL
KETONES UR: NEGATIVE mg/dL
Leukocytes, UA: NEGATIVE
NITRITE: NEGATIVE
PROTEIN: NEGATIVE mg/dL
Specific Gravity, Urine: 1.011 (ref 1.005–1.030)
pH: 7 (ref 5.0–8.0)

## 2017-11-06 LAB — SALICYLATE LEVEL

## 2017-11-06 LAB — RAPID URINE DRUG SCREEN, HOSP PERFORMED
Amphetamines: NOT DETECTED
BENZODIAZEPINES: NOT DETECTED
Barbiturates: NOT DETECTED
Cocaine: NOT DETECTED
Opiates: NOT DETECTED
Tetrahydrocannabinol: NOT DETECTED

## 2017-11-06 LAB — ACETAMINOPHEN LEVEL

## 2017-11-06 LAB — ETHANOL: Alcohol, Ethyl (B): <10 mg/dL (ref <10)

## 2017-11-06 MED ORDER — POLYETHYLENE GLYCOL 3350 17 G PO PACK
17.0000 g | PACK | Freq: Every day | ORAL | Status: DC
  Administered 2017-11-07: 17 g via ORAL
  Filled 2017-11-06: qty 1

## 2017-11-06 MED ORDER — ENSURE ENLIVE PO LIQD
237.0000 mL | Freq: Three times a day (TID) | ORAL | Status: DC
  Administered 2017-11-07: 237 mL via ORAL
  Filled 2017-11-06 (×3): qty 237

## 2017-11-06 MED ORDER — LACOSAMIDE 50 MG PO TABS
150.0000 mg | ORAL_TABLET | Freq: Two times a day (BID) | ORAL | Status: DC
  Administered 2017-11-06 – 2017-11-07 (×2): 150 mg via ORAL
  Filled 2017-11-06 (×2): qty 3

## 2017-11-06 MED ORDER — ASPIRIN EC 81 MG PO TBEC
81.0000 mg | DELAYED_RELEASE_TABLET | Freq: Every day | ORAL | Status: DC
  Administered 2017-11-07: 81 mg via ORAL
  Filled 2017-11-06: qty 1

## 2017-11-06 MED ORDER — TRAZODONE HCL 50 MG PO TABS
50.0000 mg | ORAL_TABLET | Freq: Every day | ORAL | Status: DC
  Administered 2017-11-06: 50 mg via ORAL
  Filled 2017-11-06: qty 1

## 2017-11-06 MED ORDER — LAMOTRIGINE 100 MG PO TABS
200.0000 mg | ORAL_TABLET | Freq: Two times a day (BID) | ORAL | Status: DC
  Administered 2017-11-06 – 2017-11-07 (×2): 200 mg via ORAL
  Filled 2017-11-06 (×2): qty 2

## 2017-11-06 MED ORDER — CLONAZEPAM 0.5 MG PO TABS
0.5000 mg | ORAL_TABLET | Freq: Three times a day (TID) | ORAL | Status: DC
  Administered 2017-11-06 – 2017-11-07 (×2): 0.5 mg via ORAL
  Filled 2017-11-06 (×2): qty 1

## 2017-11-06 MED ORDER — QUETIAPINE FUMARATE ER 300 MG PO TB24
300.0000 mg | ORAL_TABLET | Freq: Every day | ORAL | Status: DC
  Administered 2017-11-06: 300 mg via ORAL
  Filled 2017-11-06 (×2): qty 1

## 2017-11-06 MED ORDER — SENNOSIDES-DOCUSATE SODIUM 8.6-50 MG PO TABS
2.0000 | ORAL_TABLET | Freq: Every day | ORAL | Status: DC
  Administered 2017-11-07: 2 via ORAL
  Filled 2017-11-06: qty 2

## 2017-11-06 MED ORDER — FOLIC ACID 1 MG PO TABS
1.0000 mg | ORAL_TABLET | Freq: Every day | ORAL | Status: DC
  Administered 2017-11-06 – 2017-11-07 (×2): 1 mg via ORAL
  Filled 2017-11-06 (×2): qty 1

## 2017-11-06 MED ORDER — PRAVASTATIN SODIUM 10 MG PO TABS
10.0000 mg | ORAL_TABLET | Freq: Every day | ORAL | Status: DC
  Administered 2017-11-06: 10 mg via ORAL
  Filled 2017-11-06 (×2): qty 1
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
## 2017-11-06 NOTE — ED Notes (Signed)
Husband [phone]

## 2017-11-06 NOTE — ED Notes (Signed)
Bed: WA27 Expected date:  Expected time:  Means of arrival:  Comments: [employer] IVC 

## 2017-11-06 NOTE — BHH Counselor (Signed)
TTS writer attempted to speak with patient husband for collateral information. He requested TTS called him back in an 1 1/2.   Garlon HatchetCarl Dube - Husband - [phone])
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
## 2017-11-07 DIAGNOSIS — R413 Other amnesia: Secondary | ICD-10-CM

## 2017-11-07 DIAGNOSIS — G4700 Insomnia, unspecified: Secondary | ICD-10-CM

## 2017-11-07 DIAGNOSIS — R4189 Other symptoms and signs involving cognitive functions and awareness: Secondary | ICD-10-CM

## 2017-11-07 DIAGNOSIS — F0391 Unspecified dementia with behavioral disturbance: Secondary | ICD-10-CM | POA: Diagnosis not present

## 2017-11-07 DIAGNOSIS — R4689 Other symptoms and signs involving appearance and behavior: Secondary | ICD-10-CM

## 2017-11-07 DIAGNOSIS — R4587 Impulsiveness: Secondary | ICD-10-CM

## 2017-11-07 LAB — CBG MONITORING, ED: Glucose-Capillary: 86 mg/dL (ref 65–99)

## 2017-11-07 MED ORDER — ACETAMINOPHEN ER 650 MG PO TBCR: 650.0000 mg | EXTENDED_RELEASE_TABLET | Freq: Three times a day (TID) | ORAL | 0 refills | Status: AC | PRN

## 2017-11-07 NOTE — ED Notes (Signed)
Dr. Criss AlvineGoldston informed of pt's low b/p.  Per Dr. Criss AlvineGoldston, recheck in 30 minutes,

## 2017-11-07 NOTE — BHH Suicide Risk Assessment (Signed)
Suicide Risk Assessment  Discharge Assessment   [facility]   Principal Problem: Cognitive and behavioral changes Discharge Diagnoses:  Patient Active Problem List   Diagnosis Date Noted  . Cognitive and behavioral changes [R41.89, R46.89] 06/26/2017    Priority: High  . History of CVA (cerebrovascular accident) [Z86.73] 09/24/2016    Priority: High  . Hypoglycemia [E16.2] 07/30/2017  . Seizure (HCC) [R56.9] 06/19/2017  . Status epilepticus (HCC) [G40.901]   . Bradycardia [R00.1]   . Hypokalemia [E87.6]   . Hypertension [I10] 09/24/2016  . 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 Hyperlipidemia [E78.5] 09/24/2016    Total Time spent with patient: 45 minutes  Musculoskeletal: Strength & Muscle Tone: within normal limits Gait & Station: normal Patient leans: N/A  Psychiatric Specialty Exam: Physical Exam  Constitutional: She appears well-developed and well-nourished.  HENT:  Head: Normocephalic.  Neck: Normal range of motion.  Respiratory: Effort normal.  Musculoskeletal: Normal range of motion.  Neurological: She is alert.  Psychiatric: She has a normal mood and affect. Her speech is normal and behavior is normal. Thought content normal. Cognition and memory are impaired. She expresses impulsivity.    Review of Systems  Psychiatric/Behavioral: Positive for memory loss.  All other systems reviewed and are negative.   Blood pressure (!) 104/52, pulse (!) 54, temperature 98.3 F (36.8 C), temperature source Oral, resp. rate 16, height 5\' 5"  (1.651 m), weight 74.8 kg (165 lb), SpO2 98 %.Body mass index is 27.46 kg/m.  General Appearance: Casual  Eye Contact:  Good  Speech:  Normal Rate  Volume:  Normal  Mood:  Euphoric  Affect:  Congruent  Thought Process:  Coherent and Descriptions of Associations: Intact  Orientation:  Full (Time, Place, and Person)  Thought Content:  WDL and Logical   Suicidal Thoughts:  No  Homicidal Thoughts:  No  Memory:  Immediate;   Fair Recent;   Fair Remote;   Fair  Judgement:  Fair  Insight:  Fair  Psychomotor Activity:  Normal  Concentration:  Concentration: Good and Attention Span: Good  Recall:  FiservFair  Fund of Knowledge:  Fair  Language:  Good  Akathisia:  No  Handed:  Right  AIMS (if indicated):     Assets:  Housing Intimacy Leisure Time Physical Health Resilience Social Support  ADL's:  Intact  Cognition:  Impaired,  Mild  Sleep:       Mental Status Per Nursing Assessment::   On Admission:   aggression  Demographic Factors:  NA  Loss Factors: NA  Historical Factors: Impulsivity  Risk Reduction Factors:   Sense of responsibility to family, Living with another person, especially a relative, Positive social support and Positive therapeutic relationship  Continued Clinical Symptoms:  None  Cognitive Features That Contribute To Risk:  None    Suicide Risk:  Minimal: No identifiable suicidal ideation.  Patients presenting with no risk factors but with morbid ruminations; may be classified as minimal risk based on the severity of the depressive symptoms    Plan Of Care/Follow-up recommendations:  Activity:  as tolerated Diet:  heart healthy diet  LORD, JAMISON, NP 11/07/2017, 12:18 PM

## 2017-11-07 NOTE — Progress Notes (Signed)
IVC rescinded at 10:34AM
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
## 2018-03-15 ENCOUNTER — Other Ambulatory Visit: Payer: Self-pay

## 2018-03-15 ENCOUNTER — Emergency Department (HOSPITAL_COMMUNITY)
Admission: EM | Admit: 2018-03-15 | Discharge: 2018-03-16 | Disposition: A | Discharge status: Home / Self Care | Payer: Medicare HMO | Attending: Emergency Medicine | Admitting: Emergency Medicine

## 2018-03-15 ENCOUNTER — Emergency Department (HOSPITAL_COMMUNITY): Payer: Medicare HMO

## 2018-03-15 ENCOUNTER — Encounter (HOSPITAL_COMMUNITY): Payer: Self-pay | Admitting: *Deleted

## 2018-03-15 DIAGNOSIS — G309 Alzheimer's disease, unspecified: Secondary | ICD-10-CM | POA: Insufficient documentation

## 2018-03-15 DIAGNOSIS — F0281 Dementia in other diseases classified elsewhere with behavioral disturbance: Secondary | ICD-10-CM | POA: Insufficient documentation

## 2018-03-15 DIAGNOSIS — E119 Type 2 diabetes mellitus without complications: Secondary | ICD-10-CM | POA: Insufficient documentation

## 2018-03-15 DIAGNOSIS — F22 Delusional disorders: Secondary | ICD-10-CM

## 2018-03-15 DIAGNOSIS — R451 Restlessness and agitation: Secondary | ICD-10-CM

## 2018-03-15 DIAGNOSIS — E876 Hypokalemia: Secondary | ICD-10-CM | POA: Insufficient documentation

## 2018-03-15 DIAGNOSIS — Z79899 Other long term (current) drug therapy: Secondary | ICD-10-CM | POA: Diagnosis not present

## 2018-03-15 DIAGNOSIS — Z7982 Long term (current) use of aspirin: Secondary | ICD-10-CM | POA: Diagnosis not present

## 2018-03-15 DIAGNOSIS — I10 Essential (primary) hypertension: Secondary | ICD-10-CM | POA: Diagnosis not present

## 2018-03-15 DIAGNOSIS — I4589 Other specified conduction disorders: Secondary | ICD-10-CM | POA: Insufficient documentation

## 2018-03-15 DIAGNOSIS — R9431 Abnormal electrocardiogram [ECG] [EKG]: Secondary | ICD-10-CM

## 2018-03-15 LAB — URINALYSIS, COMPLETE (UACMP) WITH MICROSCOPIC
BACTERIA UA: NONE SEEN
Bilirubin Urine: NEGATIVE
Glucose, UA: NEGATIVE mg/dL
Ketones, ur: NEGATIVE mg/dL
Leukocytes, UA: NEGATIVE
Nitrite: NEGATIVE
PH: 6 (ref 5.0–8.0)
Protein, ur: NEGATIVE mg/dL
SPECIFIC GRAVITY, URINE: 1.005 (ref 1.005–1.030)

## 2018-03-15 LAB — ACETAMINOPHEN LEVEL: Acetaminophen (Tylenol), Serum: <10 ug/mL — ABNORMAL LOW (ref 10–30)

## 2018-03-15 LAB — CBC WITH DIFFERENTIAL/PLATELET
BASOS PCT: 0 %
Basophils Absolute: 0 10*3/uL (ref 0.0–0.1)
EOS ABS: 0 10*3/uL (ref 0.0–0.7)
Eosinophils Relative: 1 %
HEMATOCRIT: 38.5 % (ref 36.0–46.0)
HEMOGLOBIN: 12.5 g/dL (ref 12.0–15.0)
LYMPHS ABS: 1.9 10*3/uL (ref 0.7–4.0)
Lymphocytes Relative: 31 %
MCH: 29.6 pg (ref 26.0–34.0)
MCHC: 32.5 g/dL (ref 30.0–36.0)
MCV: 91.2 fL (ref 78.0–100.0)
MONOS PCT: 5 %
Monocytes Absolute: 0.3 10*3/uL (ref 0.1–1.0)
NEUTROS ABS: 4 10*3/uL (ref 1.7–7.7)
NEUTROS PCT: 63 %
Platelets: 214 10*3/uL (ref 150–400)
RBC: 4.22 MIL/uL (ref 3.87–5.11)
RDW: 13.2 % (ref 11.5–15.5)
WBC: 6.2 10*3/uL (ref 4.0–10.5)

## 2018-03-15 LAB — COMPREHENSIVE METABOLIC PANEL
ALBUMIN: 4.3 g/dL (ref 3.5–5.0)
ALK PHOS: 85 U/L (ref 38–126)
ALT: 13 U/L — AB (ref 14–54)
AST: 15 U/L (ref 15–41)
Anion gap: 11 (ref 5–15)
BUN: 14 mg/dL (ref 6–20)
CALCIUM: 9.2 mg/dL (ref 8.9–10.3)
CO2: 26 mmol/L (ref 22–32)
CREATININE: 1.12 mg/dL — AB (ref 0.44–1.00)
Chloride: 105 mmol/L (ref 101–111)
GFR calc non Af Amer: 49 mL/min — ABNORMAL LOW (ref >60)
GFR, EST AFRICAN AMERICAN: 57 mL/min — AB (ref >60)
GLUCOSE: 90 mg/dL (ref 65–99)
Potassium: 3.1 mmol/L — ABNORMAL LOW (ref 3.5–5.1)
SODIUM: 142 mmol/L (ref 135–145)
Total Bilirubin: 0.5 mg/dL (ref 0.3–1.2)
Total Protein: 7.2 g/dL (ref 6.5–8.1)

## 2018-03-15 LAB — RAPID URINE DRUG SCREEN, HOSP PERFORMED
AMPHETAMINES: NOT DETECTED
BENZODIAZEPINES: NOT DETECTED
Barbiturates: NOT DETECTED
COCAINE: NOT DETECTED
OPIATES: NOT DETECTED
Tetrahydrocannabinol: NOT DETECTED

## 2018-03-15 LAB — CBG MONITORING, ED: Glucose-Capillary: 104 mg/dL — ABNORMAL HIGH (ref 65–99)

## 2018-03-15 LAB — ETHANOL: Alcohol, Ethyl (B): <10 mg/dL (ref <10)

## 2018-03-15 LAB — AMMONIA: AMMONIA: 22 umol/L (ref 9–35)

## 2018-03-15 LAB — I-STAT CG4 LACTIC ACID, ED: Lactic Acid, Venous: 0.62 mmol/L (ref 0.5–1.9)

## 2018-03-15 LAB — SALICYLATE LEVEL: Salicylate Lvl: <7 mg/dL (ref 2.8–30.0)

## 2018-03-15 MED ORDER — SENNOSIDES-DOCUSATE SODIUM 8.6-50 MG PO TABS
2.0000 | ORAL_TABLET | Freq: Every day | ORAL | Status: DC
  Administered 2018-03-16: 2 via ORAL
  Filled 2018-03-15: qty 2

## 2018-03-15 MED ORDER — ALUM & MAG HYDROXIDE-SIMETH 200-200-20 MG/5ML PO SUSP: 30.0000 mL | Freq: Four times a day (QID) | ORAL | Status: DC | PRN

## 2018-03-15 MED ORDER — LAMOTRIGINE 100 MG PO TABS
200.0000 mg | ORAL_TABLET | Freq: Two times a day (BID) | ORAL | Status: DC
  Administered 2018-03-16 (×2): 200 mg via ORAL
  Filled 2018-03-15 (×2): qty 2

## 2018-03-15 MED ORDER — FOLIC ACID 1 MG PO TABS
1.0000 mg | ORAL_TABLET | Freq: Every day | ORAL | Status: DC
  Administered 2018-03-16: 1 mg via ORAL
  Filled 2018-03-15: qty 1

## 2018-03-15 MED ORDER — ASPIRIN EC 81 MG PO TBEC
81.0000 mg | DELAYED_RELEASE_TABLET | Freq: Every day | ORAL | Status: DC
  Administered 2018-03-16: 81 mg via ORAL
  Filled 2018-03-15: qty 1

## 2018-03-15 MED ORDER — WHITE PETROLATUM GEL
1.0000 "application " | Freq: Three times a day (TID) | Status: DC | PRN
  Filled 2018-03-15: qty 28.35

## 2018-03-15 MED ORDER — FELODIPINE ER 2.5 MG PO TB24
2.5000 mg | ORAL_TABLET | Freq: Every day | ORAL | Status: DC
  Administered 2018-03-16: 2.5 mg via ORAL
  Filled 2018-03-15 (×2): qty 1

## 2018-03-15 MED ORDER — ACETAMINOPHEN 325 MG PO TABS: 650.0000 mg | ORAL_TABLET | Freq: Four times a day (QID) | ORAL | Status: DC | PRN

## 2018-03-15 MED ORDER — LACOSAMIDE 50 MG PO TABS
150.0000 mg | ORAL_TABLET | Freq: Two times a day (BID) | ORAL | Status: DC
  Administered 2018-03-16 (×2): 150 mg via ORAL
  Filled 2018-03-15 (×2): qty 3

## 2018-03-15 MED ORDER — ZOLPIDEM TARTRATE 5 MG PO TABS: 5.0000 mg | ORAL_TABLET | Freq: Every evening | ORAL | Status: DC | PRN

## 2018-03-15 MED ORDER — CLONAZEPAM 0.5 MG PO TABS
0.5000 mg | ORAL_TABLET | Freq: Three times a day (TID) | ORAL | Status: DC | PRN
  Administered 2018-03-16: 0.5 mg via ORAL
  Filled 2018-03-15: qty 1

## 2018-03-15 MED ORDER — POLYETHYLENE GLYCOL 3350 17 G PO PACK
17.0000 g | PACK | Freq: Every day | ORAL | Status: DC
  Administered 2018-03-16: 17 g via ORAL
  Filled 2018-03-15: qty 1

## 2018-03-15 MED ORDER — POTASSIUM CHLORIDE CRYS ER 20 MEQ PO TBCR
60.0000 meq | EXTENDED_RELEASE_TABLET | Freq: Once | ORAL | Status: AC
  Administered 2018-03-16: 60 meq via ORAL
  Filled 2018-03-15: qty 3

## 2018-03-15 MED ORDER — PRAVASTATIN SODIUM 20 MG PO TABS
10.0000 mg | ORAL_TABLET | Freq: Every day | ORAL | Status: DC
  Administered 2018-03-16: 10 mg via ORAL
  Filled 2018-03-15: qty 1

## 2018-03-15 MED ORDER — ENSURE ENLIVE PO LIQD
237.0000 mL | Freq: Three times a day (TID) | ORAL | Status: DC
  Administered 2018-03-16 (×2): 237 mL via ORAL
  Filled 2018-03-15 (×4): qty 237

## 2018-03-15 NOTE — ED Notes (Signed)
Bed: WA31 Expected date:  Expected time:  Means of arrival:  Comments: 

## 2018-03-15 NOTE — ED Provider Notes (Signed)
[facility]-EMERGENCY DEPT Provider Note   CSN: [id number] Arrival date & time: 03/15/18  1736     History   Chief Complaint Chief Complaint  Patient presents with  . Paranoid    HPI Angela Fields is a 67 y.o. female with a PMHx of dementia, alzheimer's with behavioral changes, DM2, HTN, HLD, seizures, remote CVA, and other conditions listed below, who presents to the ED for reportedly having agitation and paranoid thoughts.  LEVEL 5 CAVEAT DUE TO DEMENTIA/?AMS, most of the history is provided by her close family friends ("spiritual family"), Mr. Angela Fields and Mr. Angela Fields.  The patient states "my husband went crazy, he got a man and they are always together", she refers to believing that her husband is in a relationship with this man; she is worried that her husband is trying to hurt her.  She is tangential at times, and has difficulty expressing herself, but states she has aphasia and can't get her words out.  She states that she's taking her medications correctly but she can't recall what medications she's on.  She denies having SI, HI, AVH, or using illicit drugs or EtOH.  She is a nonsmoker.  She denies having any medical complaints at this time, including any recent illnesses or fevers/chills, also denies CP, SOB, abd pain, N/V/D/C, hematuria, dysuria, myalgias, arthralgias, numbness, tingling, focal weakness, or any other complaints at this time however she has dementia and is confused therefore her answers are questionable for accuracy.  Her family friends state that ever since her stroke several years ago she has periods of time where she becomes very forgetful and has difficulty forming sentences, stutters.  They feel that she is mostly at her mental baseline today, but states that she was very upset with her husband and accusing him of many things which is why she was brought here.  They are not aware of any other recent problems or illnesses, but states that  this has happened before and she's had to come here.  Per chart review, her last visit for something similar was in January.  They're not sure if she's taking her medications or not.  Her husband is Angela Fields and his # is [phone]; they stated he was going to stay home for now because he wanted to let her calm down.  Of note, per chart review, pt was seen by her neurologist at [facility] (Dr. Windle Guard) on 02/17/18 due to seroquel 50mg  BID dosing causing too much drowsiness, so they decreased it to 50mg  QHS and added Nuedexta (dextromethorphan-quinidine) 20-10mg  once daily for a week then increase to BID.  She is on these for behavioral disturbances related to her alzheimer's dementia.  She was seen again by neurology on 03/12/18 due to the fact that when she increased the Nuedexta to BID she became drowsy, therefore they instructed her to continue on QD dosing.  Remainder of HPI/ROS is limited due to absence of relative/caregiver, and pt's confusion/?AMS.   Additional information obtained from her husband Angela Fields: states that they went to church this morning and she was doing fine, around 1pm they got food and after that the pt was acting differently, couldn't find her shoes and very forgetful; acting somewhat confused and unsteady.  He states that this type of episode will happen every few months, and that today's episode mimicked the episode in January; he states that she is forgetful at times, and is often times not oriented to place/time so this isn't really a change  from her usual baseline, but it seems to fluctuate.  Today she gradually became more agitated and upset, wanted to leave the house, and when he tried to stop her from leaving she started screaming out the window saying that he was trying to kill her.  At that time he called his spiritual family members and 911.  He denies that she's had any recent issues or illnesses, denies any fevers, cough, vomiting, diarrhea, etc.  States that she's compliant with her  meds, but isn't on Seroquel anymore (despite the fact that it looks like she was still supposed to be on it at night); he says that they both felt like when she was on the seroquel she was doing better overall, and wondered whether going back on it would be beneficial for these episodes.  She is still taking the Nuedexta once daily as prescribed, and all her other medications.    The history is provided by the patient, medical records and a friend. The history is limited by the condition of the patient. No language interpreter was used.    Past Medical History:  Diagnosis Date  . Dementia   . Diabetes mellitus without complication (HCC)   . Hyperlipidemia   . Hypertension   . Seizures (HCC)   . Stroke [facility])     Patient Active Problem List   Diagnosis Date Noted  . Hypoglycemia 07/30/2017  . Cognitive and behavioral changes 06/26/2017  . Seizure (HCC) 06/19/2017  . Status epilepticus (HCC)   . Bradycardia   . Hypokalemia   . Hypertension 09/24/2016  . History of CVA (cerebrovascular accident) 09/24/2016  . 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 Hyperlipidemia 09/24/2016    No past surgical history on file.   OB History   None      Home Medications    Prior to Admission medications   Medication Sig Start Date End Date Taking? Authorizing Provider  acetaminophen (TYLENOL 8 HOUR ARTHRITIS PAIN) 650 MG CR tablet Take 1 tablet (650 mg total) by mouth every 8 (eight) hours as needed for pain. 11/07/17   Charm Rings, NP  aspirin EC 81 MG EC tablet Take 1 tablet (81 mg total) by mouth daily. 08/05/17   Osvaldo Shipper, MD  clonazePAM (KLONOPIN) 1 MG tablet Take 0.5 mg by mouth 3 (three) times daily. 05/23/17   [provider]  feeding supplement, ENSURE ENLIVE, (ENSURE ENLIVE) LIQD Take 237 mLs by mouth 3 (three) times daily between meals. 08/05/17   Osvaldo Shipper, MD  felodipine (PLENDIL) 2.5 MG 24 hr tablet Take 2.5 mg by mouth daily.  10/25/17   [provider]  folic acid (FOLVITE) 1 MG tablet Take 1 mg by mouth daily.    [provider]  lacosamide 150 MG TABS Take 1 tablet (150 mg total) by mouth 2 (two) times daily. 08/05/17   Osvaldo Shipper, MD  lamoTRIgine (LAMICTAL) 200 MG tablet Take 1 tablet (200 mg total) by mouth 2 (two) times daily. 08/05/17   Osvaldo Shipper, MD  polyethylene glycol [facility] / Ethelene Hal) packet Take 17 g by mouth daily.    [provider]  pravastatin (PRAVACHOL) 10 MG tablet Take 10 mg by mouth at bedtime.     [provider]  QUEtiapine (SEROQUEL XR) 300 MG 24 hr tablet Take 300 mg by mouth at bedtime.    [provider]  QUEtiapine (SEROQUEL) 25 MG tablet Take 25 mg by mouth at bedtime. 10/20/17   [provider]  sennosides-docusate sodium (SENOKOT-S) 8.6-50 MG tablet Take 2 tablets by mouth daily.    [provider]  traZODone (DESYREL) 50 MG tablet Take 1 tablet (50 mg total) by mouth at bedtime. Patient taking differently: Take 150 mg by mouth at bedtime.  06/26/17   Charm Rings, NP  white petrolatum ointment Apply 1 application topically 3 (three) times daily as needed for dry skin.    [provider]    Family History No family history on file.  Social History Social History   Tobacco Use  . Smoking status: Never Smoker  . Smokeless tobacco: Never Used  Substance Use Topics  . Alcohol use: No  . Drug use: No     Allergies   Patient has no known allergies.   Review of Systems Review of Systems  Unable to perform ROS: Dementia  Constitutional: Negative for chills and fever.  Respiratory: Negative for shortness of breath.   Cardiovascular: Negative for chest pain.  Gastrointestinal: Negative for abdominal pain, constipation, diarrhea, nausea and vomiting.  Genitourinary: Negative for dysuria and hematuria.  Musculoskeletal: Negative for arthralgias and myalgias.  Skin: Negative for color change.    Allergic/Immunologic: Positive for immunocompromised state (DM2).  Neurological: Negative for weakness and numbness.  Psychiatric/Behavioral: Positive for agitation. Negative for hallucinations and suicidal ideas.   LEVEL 5 CAVEAT DUE TO DEMENTIA/?AMS  Physical Exam Updated Vital Signs BP (!) 142/65 (BP Location: Right Arm)   Pulse 77   Temp 98.3 F (36.8 C) (Oral)   Resp 18   Wt 74.8 kg (165 lb)   SpO2 94%   BMI 27.46 kg/m   Physical Exam  Constitutional: Vital signs are normal. She appears well-developed and well-nourished.  Non-toxic appearance. No distress.  Afebrile, nontoxic, NAD  HENT:  Head: Normocephalic and atraumatic.  Mouth/Throat: Oropharynx is clear and moist and mucous membranes are normal.  Eyes: Pupils are equal, round, and reactive to light. Conjunctivae and EOM are normal. Right eye exhibits no discharge. Left eye exhibits no discharge.  PERRL, no nystagmus although doesn't isn't really able to follow commands enough to perform adequate EOM exam but appears to have intact EOM.   Neck: Normal range of motion. Neck supple. No neck rigidity. Normal range of motion present.  No meningeal signs or rigidity  Cardiovascular: Normal rate, regular rhythm, normal heart sounds and intact distal pulses. Exam reveals no gallop and no friction rub.  No murmur heard. Pulmonary/Chest: Effort normal and breath sounds normal. No respiratory distress. She has no decreased breath sounds. She has no wheezes. She has no rhonchi. She has no rales.  Abdominal: Soft. Normal appearance and bowel sounds are normal. She exhibits no distension. There is no tenderness. There is no rigidity, no rebound, no guarding, no CVA tenderness, no tenderness at McBurney's point and negative Murphy's sign.  Musculoskeletal: Normal range of motion.  MAE x4 Strength and sensation grossly intact in all extremities Distal pulses intact  Neurological: She is alert. She has normal strength. No sensory  deficit. GCS eye subscore is 4. GCS verbal subscore is 4. GCS motor subscore is 5.  Alert and oriented to self, but not to place or time. Knows she's in [place] but has difficulty finding the words to express what city she's in; repeatedly states she has aphasia and therefore can't get the words out that she wants to. When asked the year, she says "1-9" and mumbles about not remembering the words, never able to state the year entirely; doesn't  know the month. Knows her DOB and name, but thinks she's 67y/o.  Stutters at times, has difficulty word finding. No slurred speech.  Has difficulty following simple commands, unable to perform finger-to-nose testing or rapid alternating movements.   Skin: Skin is warm, dry and intact. No rash noted.  Psychiatric: Her mood appears anxious. Her speech is tangential. She is not actively hallucinating. Thought content is paranoid. She expresses no homicidal and no suicidal ideation. She expresses no suicidal plans and no homicidal plans.  Somewhat anxious appearing, paranoid thoughts. Denies SI, HI, or AVH. Doesn't appear to be responding to internal stimuli. Tangential at times. Stuttering at times, and has difficulty word finding.   Nursing note and vitals reviewed.    ED Treatments / Results  Labs (all labs ordered are listed, but only abnormal results are displayed) Labs Reviewed  COMPREHENSIVE METABOLIC PANEL - Abnormal; Notable for the following components:      Result Value   Potassium 3.1 (*)    Creatinine, Ser 1.12 (*)    ALT 13 (*)    GFR calc non Af Amer 49 (*)    GFR calc Af Amer 57 (*)    All other components within normal limits  URINALYSIS, COMPLETE (UACMP) WITH MICROSCOPIC - Abnormal; Notable for the following components:   Hgb urine dipstick MODERATE (*)    All other components within normal limits  ACETAMINOPHEN LEVEL - Abnormal; Notable for the following components:   Acetaminophen (Tylenol), Serum <10 (*)    All other components within  normal limits  CBG MONITORING, ED - Abnormal; Notable for the following components:   Glucose-Capillary 104 (*)    All other components within normal limits  URINE CULTURE  CBC WITH DIFFERENTIAL/PLATELET  AMMONIA  RAPID URINE DRUG SCREEN, HOSP PERFORMED  ETHANOL  SALICYLATE LEVEL  I-STAT CG4 LACTIC ACID, ED    EKG EKG Interpretation  Date/Time:  Sunday March 15 2018 21:14:48 EDT Ventricular Rate:  51 PR Interval:    QRS Duration: 85 QT Interval:  594 QTC Calculation: 548 R Axis:   -6 Text Interpretation:  Sinus rhythm Borderline prolonged PR interval Low voltage, precordial leads Abnormal T, consider ischemia, diffuse leads Prolonged QT interval Confirmed by Lorre Nick ([id number]) on 03/15/2018 9:24:30 PM   Radiology Ct Head Wo Contrast  Result Date: 03/15/2018 CLINICAL DATA:  Unexplained acute mental status changes. EXAM: CT HEAD WITHOUT CONTRAST TECHNIQUE: Contiguous axial images were obtained from the base of the skull through the vertex without intravenous contrast. COMPARISON:  Numerous prior CT head examinations dating back to 09/24/2016, most recently 11/06/2017. FINDINGS: Brain: Remote prior LEFT parieto-occipital stroke with encephalomalacia, unchanged. Remote focal RIGHT occipital lobe stroke with encephalomalacia, unchanged. No significant cortical atrophy. Enlargement of the LATERAL ventricles, LEFT larger than RIGHT, unchanged, felt to be ex vacuo enlargement due to the encephalomalacia. Moderate changes of small vessel disease of the white matter. No mass lesion. No midline shift. No acute hemorrhage or hematoma. No extra-axial fluid collections. No evidence of acute infarction. 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 Mild BILATERAL carotid siphon atherosclerosis. No hyperdense vessel. Skull: No skull fracture or other focal osseous abnormality involving the skull. Sinuses/Orbits: Visualized paranasal sinuses, bilateral mastoid air  cells and bilateral middle ear cavities well-aerated. Visualized orbits and globes normal in appearance. Other: None. IMPRESSION: 1. No acute intracranial abnormality. 2. Remote large LEFT parieto-occipital stroke with encephalomalacia and remote focal RIGHT occipital lobe stroke with encephalomalacia. 3. Stable moderate chronic microvascular  ischemic changes of the white matter. Electronically Signed   By: Hulan Saas M.D.   On: 03/15/2018 18:45   Dg Chest Port 1 View  Result Date: 03/15/2018 CLINICAL DATA:  Altered mental status. Increased agitation. Evaluate for infection. EXAM: PORTABLE CHEST 1 VIEW COMPARISON:  11/06/2017 FINDINGS: Patient is rotated to the right.The cardiomediastinal contours are normal for rotation. Unchanged left lung base scarring. Pulmonary vasculature is normal. No consolidation, pleural effusion, or pneumothorax. No acute osseous abnormalities are seen. IMPRESSION: No acute findings, no evidence of infection. Left lung base scarring. Electronically Signed   By: Rubye Oaks M.D.   On: 03/15/2018 18:50    Procedures Procedures (including critical care time)  Medications Ordered in ED Medications  potassium chloride SA (K-DUR,KLOR-CON) CR tablet 60 mEq (has no administration in time range)  aspirin EC tablet 81 mg (has no administration in time range)  clonazePAM (KLONOPIN) tablet 0.5 mg (has no administration in time range)  feeding supplement (ENSURE ENLIVE) (ENSURE ENLIVE) liquid 237 mL (has no administration in time range)  felodipine (PLENDIL) 24 hr tablet 2.5 mg (has no administration in time range)  folic acid (FOLVITE) tablet 1 mg (has no administration in time range)  Lacosamide TABS 150 mg (has no administration in time range)  lamoTRIgine (LAMICTAL) tablet 200 mg (has no administration in time range)  polyethylene glycol (MIRALAX / GLYCOLAX) packet 17 g (has no administration in time range)  pravastatin (PRAVACHOL) tablet 10 mg (has no administration  in time range)  sennosides-docusate sodium (SENOKOT-S) 8.6-50 MG tablet 2 tablet (has no administration in time range)  white petrolatum ointment 1 application (has no administration in time range)  acetaminophen (TYLENOL) tablet 650 mg (has no administration in time range)  zolpidem (AMBIEN) tablet 5 mg (has no administration in time range)  alum & mag hydroxide-simeth (MAALOX/MYLANTA) 200-200-20 MG/5ML suspension 30 mL (has no administration in time range)     Initial Impression / Assessment and Plan / ED Course  I have reviewed the triage vital signs and the nursing notes.  Pertinent labs & imaging results that were available during my care of the patient were reviewed by me and considered in my medical decision making (see chart for details).     67 y.o. female here for paranoid behaviors. Level 5 caveat applies due to dementia vs ?AMS, most of the information was obtained from her close family friends. They state that she has fluctuating issues with memory, has difficulty forming sentences and sometimes stutters ever since her stroke several years ago; state she's at her mental baseline today but she had an incident at home where she became very agitated with her husband and was accusing him of multiple things. Pt reports that her husband "went crazy" and is convinced that he has a female companion and that he wants to hurt her. She denies HI/SI/AVH/drug use/EtOH use/tobacco use, and reports compliance with meds, although she really can't tell me what meds she's on. On exam, she occasionally stutters and has difficulty finding words, states she has aphasia and can't get words out (apparently this isn't a new issue, per her friends); she is oriented to self but not quite to place or time (when asked the year she says "1-9" but doesn't ever actually say it's 2019). Chart review reveals that she's recently had neurology visits where they changed her meds around, and she's on seroquel and nuedexta for  "behavioral issues" related to her alzheimer's. She's also had recent psych visits for very similar  issues. Given that she's not completed oriented, will get AMS work up including labs and CT head as well as CXR to ensure no sources of infection and r/o organic causes of her behavioral disturbances. Will attempt to contact her husband to see if we can get additional information. Discussed case with my attending Dr. Freida BusmanAllen who agrees with plan. Will reassess shortly.   7:25 PM CBG 104. U/A without evidence of infection, some hematuria but this appears to be chronic. UDS negative. CXR negative for acute findings.  CT head without acute findings, shows remote large L parieto-occipital stroke with encephalomalacia and remote focal R occipital stroke with encephalomalacia, also with stable chronic microvascular ischemic changes of white matter. Spoke with her husband Angela AshCarl who reiterated similar story to what her friends had said, reported that around 1pm she became agitated and somewhat confused, although she has periods of confusion like this every few months; not usually oriented to place/time so this isn't really new, but appears to be a fluctuating issue. States she's on nuedexta once daily but not taking the seroquel and wonders whether it would help to be put back on this; thinks she might need to go back to psychiatric facility, says today's episode is similar to episode from Jan. Denies any recent illnesses or other issues recently. Will continue to monitor and await further testing results, then reassess shortly.   11:03 PM Unfortunately there were many delays with getting her labs drawn, but they have finally resulted. Pt has remained calm during her ED stay. Labs reveal: CBC w/diff WNL. CMP with mildly low K 3.1, will orally replete, otherwise stable kidney function and no other acute findings. Lactic negative. Salicylate/acetaminophen levels WNL. EtOH level undetectable. Ammonia WNL. EKG with borderline  PR prolongation, low voltage leads, diffuse T wave flattening, and prolonged QT; PR and QT changes are new, but the rest of it looks somewhat similar to prior EKG in January though the T waves are slightly flatter today. Overall reassuring work up, doubt acute emergent medical condition causing her symptoms today, likely neuro-psychiatric in nature. Will proceed with TTS consultation.  Pt medically cleared at this time. Psych hold orders and home med orders placed (except for Nuedexta which isn't in our system). Please see TTS notes for further documentation of care/dispo. Pt stable at time of med clearance.     Final Clinical Impressions(s) / ED Diagnoses   Final diagnoses:  Paranoid behavior (HCC)  Agitation  Alzheimer's dementia with behavioral disturbance, unspecified timing of dementia onset  Prolonged Q-T interval on ECG  Hypokalemia    ED Discharge Orders    [address]       Karine Garn, Aguas BuenasMercedes, [place] JerseyPA-C 03/15/18 2304    Lorre NickAllen, Anthony, MD 03/16/18 1037

## 2018-03-15 NOTE — ED Notes (Signed)
EKG given to EDP,Allen,MD., for review. 

## 2018-03-15 NOTE — ED Notes (Signed)
Bed: [id number] Expected date:  Expected time:  Means of arrival:  Comments: Room 28
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
## 2018-03-16 ENCOUNTER — Other Ambulatory Visit: Payer: Self-pay

## 2018-03-16 DIAGNOSIS — G309 Alzheimer's disease, unspecified: Secondary | ICD-10-CM | POA: Diagnosis not present

## 2018-03-16 MED ORDER — POTASSIUM CHLORIDE CRYS ER 20 MEQ PO TBCR: 20.0000 meq | EXTENDED_RELEASE_TABLET | Freq: Every day | ORAL | 0 refills | Status: DC
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
## 2018-03-16 NOTE — ED Notes (Signed)
Offered patient graham crackers, cheese, and apple juice for snack.  Patient did not eat any breakfast.

## 2018-03-16 NOTE — ED Notes (Signed)
Patient currently denies SI/HI and AVH. Plan of care discussed. Encouragement and support provided and safety maintain. Q 15 min safety checks remain in place and video monitoring.
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
## 2018-03-16 NOTE — ED Notes (Signed)
[facility] Expected date:  Expected time:  Means of arrival:  Comments: Room 15

## 2018-03-16 NOTE — ED Notes (Signed)
Served patient lunch but she said she didn't want any as she had weight to lose.

## 2018-03-16 NOTE — ED Provider Notes (Signed)
Patient has been cleared by psychiatry.  Mildly low potassium of 3.1.  She is currently in no acute distress and eating and drinking without difficulty.  She has no further thoughts of paranoia at this exact time and has no suicidal or homicidal thoughts.  Appears medically stable for discharge home.   Montford Barg, ScPricilla Lovelessott, MD 03/16/18 [phone]

## 2018-03-16 NOTE — BHH Counselor (Addendum)
Patient meets criteria for inpatient treatment Angela Fields(Gero Psych) per Dr. Sharma [name] and [name], [name]. Patient awaiting placement. Patient [facility] to [facility], [facility], [name], [name], [name], [name], [name], [name], and [facility].

## 2018-03-16 NOTE — ED Notes (Signed)
Patient sitting on edge of bed pleasantly confused.

## 2018-03-16 NOTE — ED Notes (Signed)
Pt did not eat lunch or dinner. She ate some jello at lunch. Offered more, but pt said she did not want anything.

## 2018-03-16 NOTE — ED Notes (Signed)
Assisted pt to the BR so that she would not fall. She is slow to answer questions and said, "I have aphasia."

## 2018-03-16 NOTE — ED Notes (Signed)
AVS, and prescription provided and reviewed. Verbalized understanding of DC instructions and prescriptions. Denied SI/HI/AVH. Denied pain. Offered no questions or concerns. Escorted off the unit, belongings returned and directed to front exit by MHT. Pt left in no acute distress. Spouse was also provided with a list of Assisted Living Facilities int he [place].

## 2018-03-16 NOTE — Progress Notes (Signed)
TTS consulted with Donell SievertSpencer Simon, PA who recommends inpt geropsych treatment. TTS to seek placement. Maddie, RN and EDP Misty StanleyLisa, [place] have been advised of the disposition.  Princess BruinsAquicha Maleko Greulich, MSW, LCSW Therapeutic Triage Specialist  [phone]

## 2018-03-16 NOTE — Discharge Instructions (Signed)
Your potassium is a little bit low.  Take these potassium supplements and follow-up closely with your doctor for repeat lab check.

## 2018-03-16 NOTE — ED Notes (Signed)
Patient alert to name only.  Patient had to have nurse guide her to bathroom and was unable to find her way back to her room.

## 2018-03-17 LAB — URINE CULTURE

## 2018-03-18 ENCOUNTER — Telehealth: Payer: Self-pay | Admitting: Emergency Medicine

## 2018-03-18 NOTE — Telephone Encounter (Signed)
Post ED Visit - Positive Culture Follow-up  Culture report reviewed by antimicrobial stewardship pharmacist:  []  Enzo BiNathan Batchelder, Pharm.D. []  Celedonio MiyamotoJeremy Frens, Pharm.D., BCPS AQ-ID []  Garvin FilaMike Maccia, Pharm.D., BCPS []  Georgina PillionElizabeth Martin, [address] BCPS []  Lone OakMinh Pham, [address]Ph[address]D., BCPS, AAHIVP []  Estella HuskMichelle Turner, Pharm.D., BCPS, AAHIVP []  Lysle Pearlachel Rumbarger, PharmD, BCPS []  Sherlynn CarbonAustin Lucas, PharmD []  Pollyann SamplesAndy Johnston, PharmD, BCPS [facility] PharmD  Positive urine culture Treated with none,asymptomatic, and no further patient follow-up is required at this time.  Berle MullMiller, Rainna Nearhood 03/18/2018, 4:24 PM

## 2018-03-20 NOTE — Care Management Note (Signed)
Case Management Note  Patient Details  Name: Angela DohenyCarolyn Mccuen MRN: [id number] Date of Birth: 02/22/1951  [facility] a [facility] for [facility].  After chart review it came to CM's attention he was requesting a list of ALF's with memory care units.  CM consulted with CSW who will also speak with pt's spouse.  CM additionally noted HH orders were placed without a consult to CM which is why the CM team was unaware of the [facility] needs.  CM spoke with pt's spouse in the conference room to discuss [facility].  CM advised him that based on her insurance, CM had limited choice in [facility] agency for [facility] pt.  Pt's spouse was ok with whoever was able to take the pt [facility] services and was also in agreement that they would likely not last very long due to not having much skilled HH need.  CM and spouse also discussed PCS and house maids.  CM contacted Shanda BumpsJessica with [facility] who is one of the agencies able to accept [employer] Medicare.  She advised she was able to accept the pt for services.  CM and Shanda BumpsJessica discussed pt and spouse's needs and determined at a minimum CSW was needed to see the pt and speak with the spouse immediately with a cognitive assessment from SLP.  Will send a message to Dr. Criss AlvineGoldston for update.  No further CM needs noted at this time.  Expected Discharge Date:   03/16/2018               Expected Discharge Plan:  Home w Home Health Services  In-House Referral:  Clinical Social Work  Discharge planning Services  CM Consult  Post Acute Care Choice:  Home Health Choice offered to:  Spouse  HH Arranged:  RN, PT, OT, Nurse's Aide, Speech Therapy, Social Work [facility] Agency:  [facility]  Status of Service:  Completed, signed off  Dareen Gutzwiller, Lynnae Sandhoffngela N, RN 03/20/2018, 3:30 PM

## 2018-05-22 IMAGING — CT CT HEAD W/O CM
3 of 4 series · 15 of 47 positions shown, 18 images · non-contrast
Comparison: June 14, 2017.

CLINICAL DATA: Altered mental status.

EXAM:
CT HEAD WITHOUT CONTRAST
TECHNIQUE: Contiguous axial images were obtained from the base of the skull
through the vertex without intravenous contrast.

[Series 3: head w/o · axial · non-contrast · 0.46mm/px · z∈[-177,-47]mm · 9 of 32 slices shown, 12 images]
[im 3/32  brain]
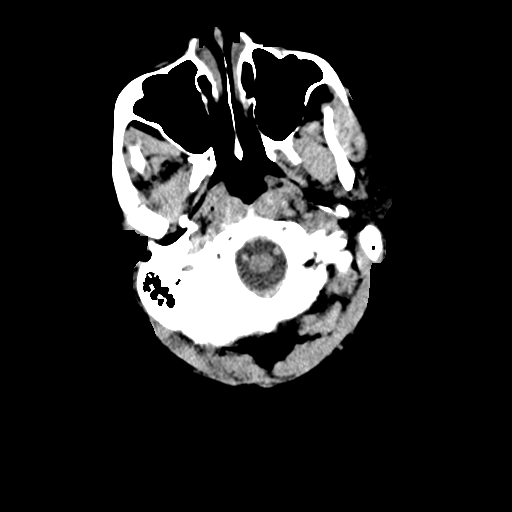
[im 3/32  bone]
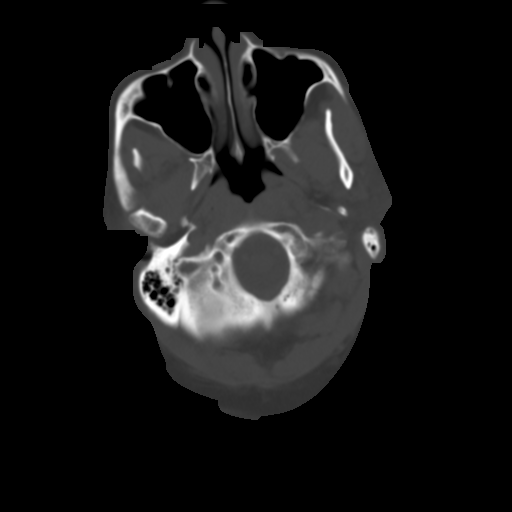
[im 7/32  brain]
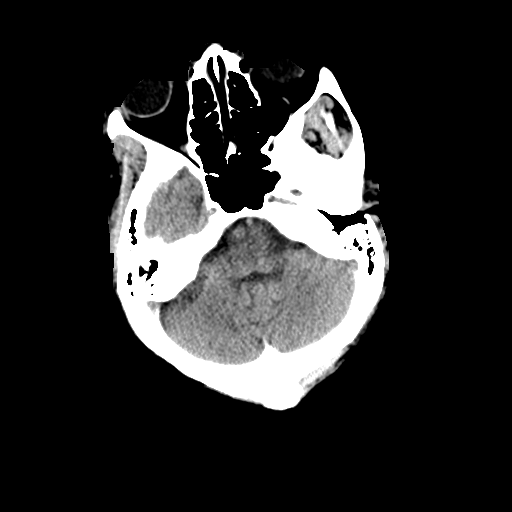
[im 9/32  brain]
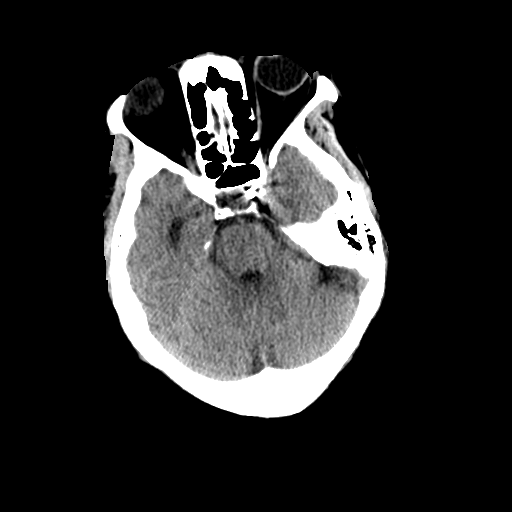
[im 14/32  brain]
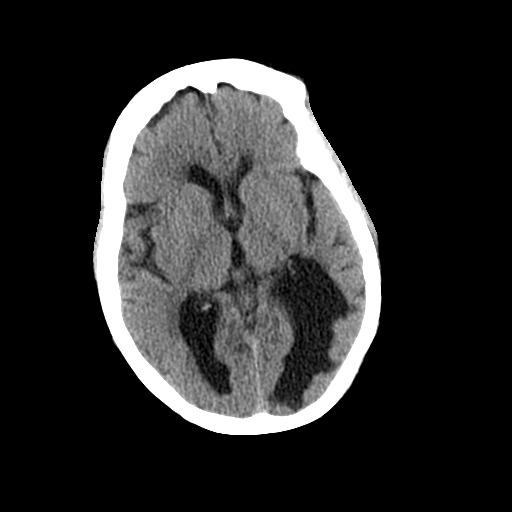
[im 16/32  brain]
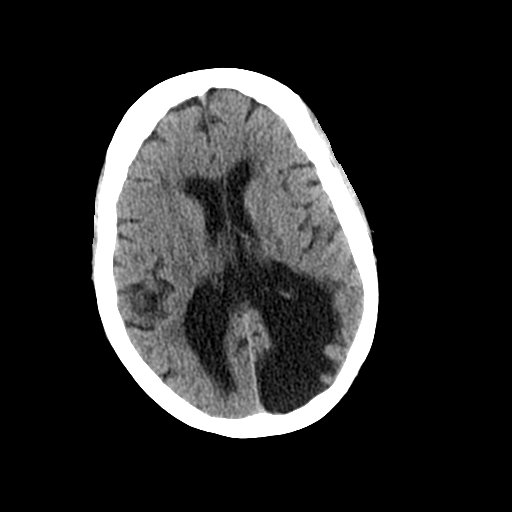
[im 16/32  bone]
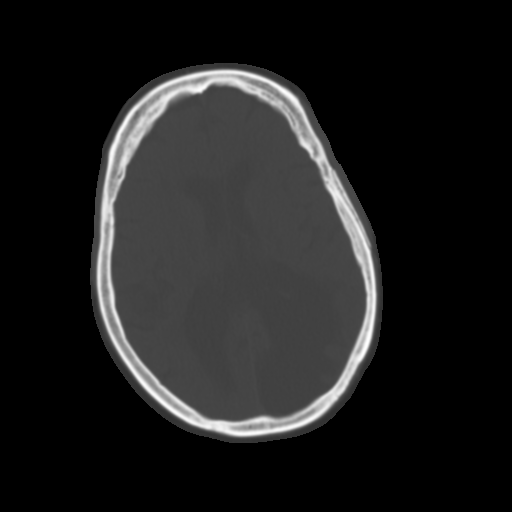
[im 18/32  brain]
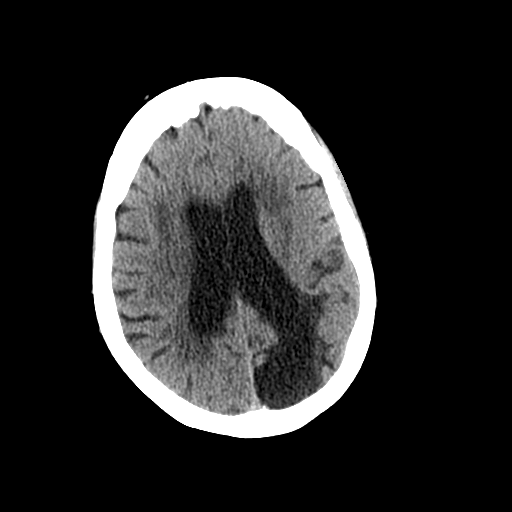
[im 23/32  brain]
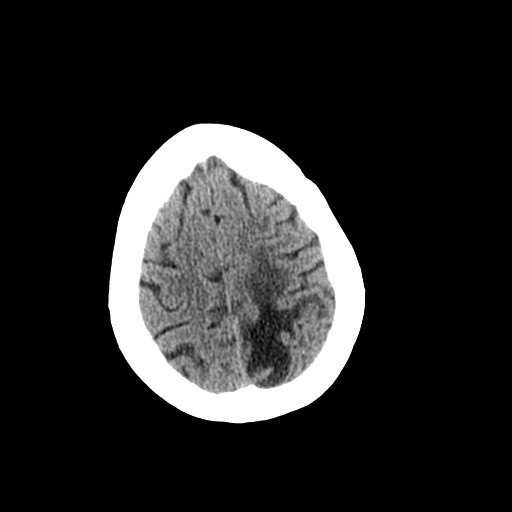
[im 25/32  brain]
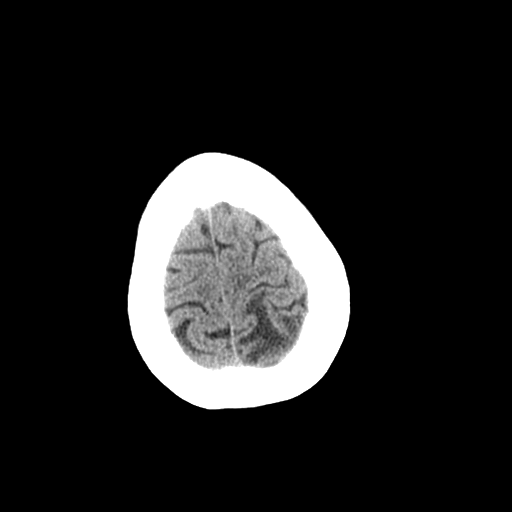
[im 29/32  brain]
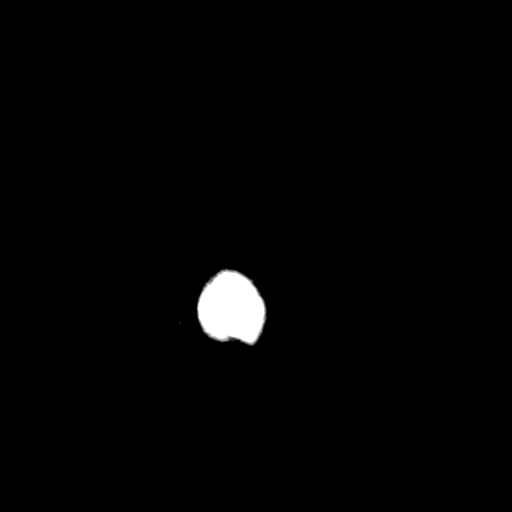
[im 29/32  bone]
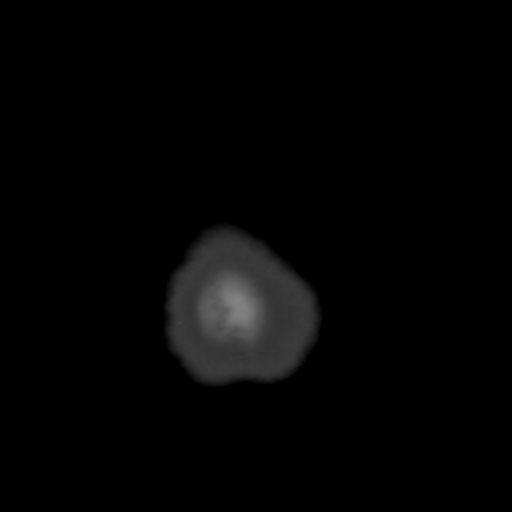

[Series 4: coronal · coronal · 0.31mm/px · 3 of 77 slices shown]
[im 26/77  brain]
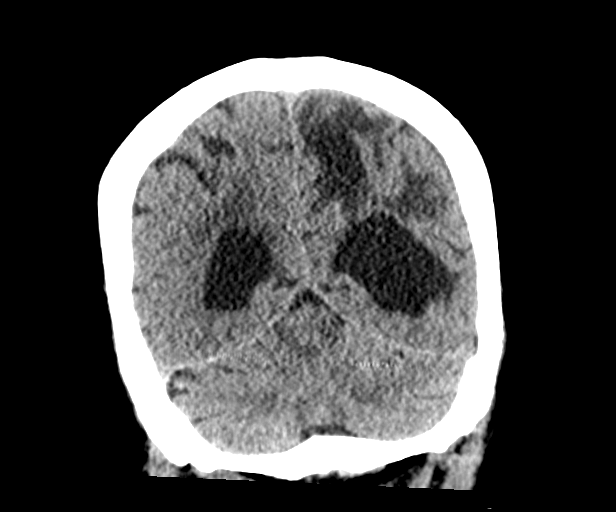
[im 34/77  brain]
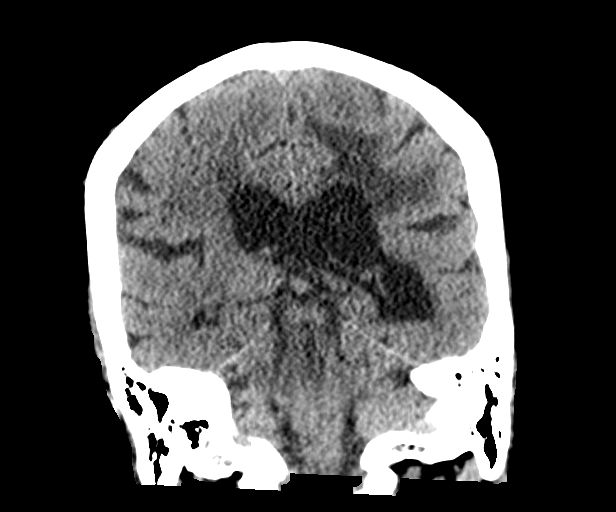
[im 43/77  brain]
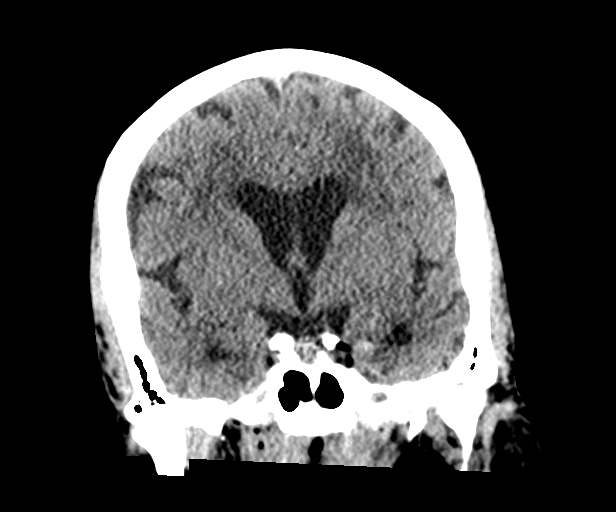

[Series 5: sagittal · sagittal · 0.31mm/px · 3 of 55 slices shown]
[im 19/55  brain]
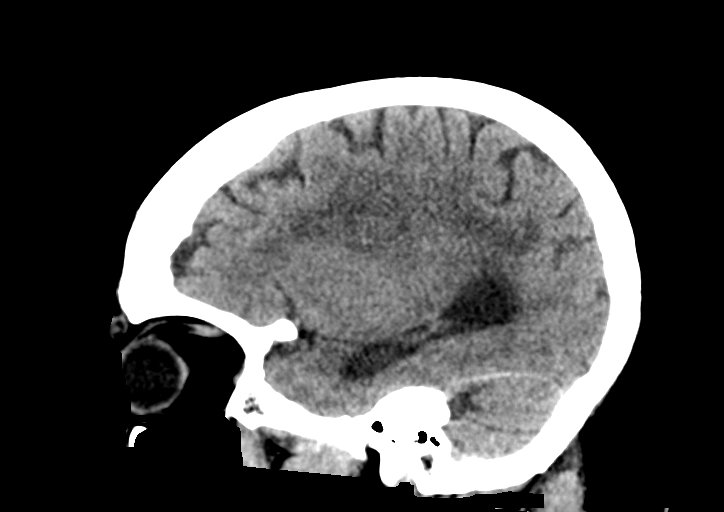
[im 28/55  brain]
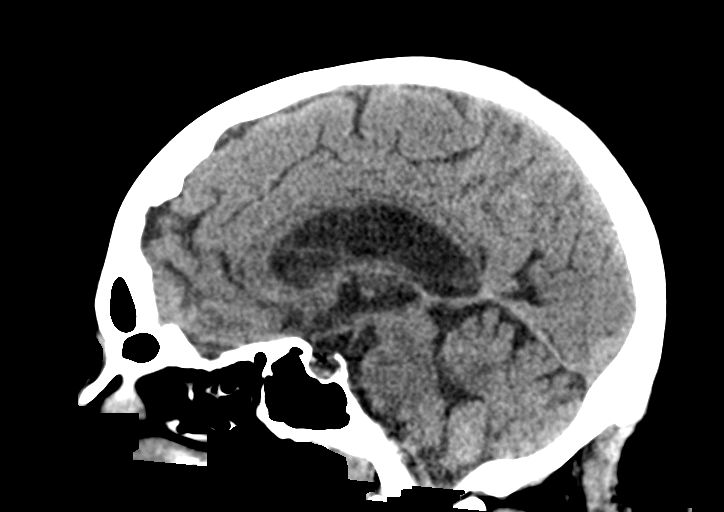
[im 37/55  brain]
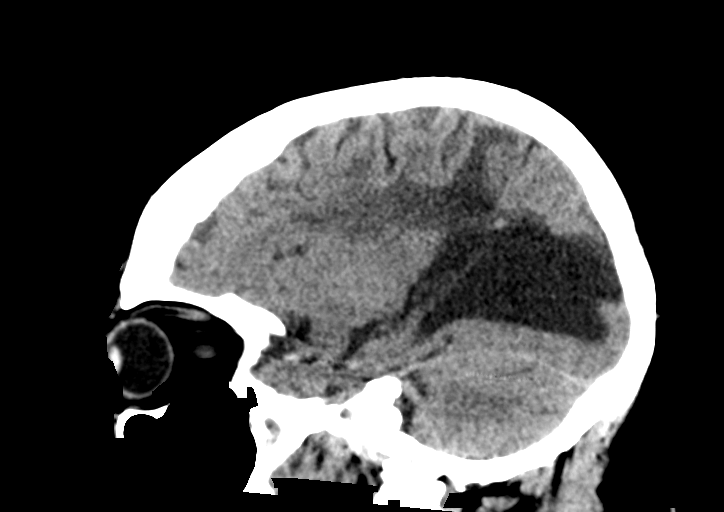

[15 of 47 positions shown; findings below may reference images not displayed]

FINDINGS: Brain: No evidence of acute infarction, hemorrhage, hydrocephalus,
extra-axial collection or mass lesion/mass effect. Unchanged left
parietal and occipital encephalomalacia with ex vacuo dilatation of
the left ventricle. Periventricular white matter and corona radiata
hypodensities favor chronic ischemic microvascular white matter
disease.

Vascular: No hyperdense vessel or unexpected calcification.
Unchanged metal artifact near the left cavernous carotid artery.

Skull: Normal. Negative for fracture or focal lesion.

Sinuses/Orbits: The bilateral paranasal sinuses and mastoid air
cells are clear. The orbits are unremarkable.

Other: None.
IMPRESSION: No acute intracranial abnormality. Unchanged appearance of the
chronic left parieto-occipital infarct.

## 2018-07-06 ENCOUNTER — Emergency Department (HOSPITAL_COMMUNITY): Payer: Medicare HMO

## 2018-07-06 ENCOUNTER — Emergency Department (HOSPITAL_COMMUNITY)
Admission: EM | Admit: 2018-07-06 | Discharge: 2018-07-07 | Disposition: A | Discharge status: Home / Self Care | Payer: Medicare HMO | Attending: Emergency Medicine | Admitting: Emergency Medicine

## 2018-07-06 DIAGNOSIS — Z79899 Other long term (current) drug therapy: Secondary | ICD-10-CM | POA: Insufficient documentation

## 2018-07-06 DIAGNOSIS — F0391 Unspecified dementia with behavioral disturbance: Secondary | ICD-10-CM

## 2018-07-06 DIAGNOSIS — Z7982 Long term (current) use of aspirin: Secondary | ICD-10-CM | POA: Insufficient documentation

## 2018-07-06 DIAGNOSIS — R410 Disorientation, unspecified: Secondary | ICD-10-CM | POA: Diagnosis not present

## 2018-07-06 DIAGNOSIS — E119 Type 2 diabetes mellitus without complications: Secondary | ICD-10-CM | POA: Insufficient documentation

## 2018-07-06 DIAGNOSIS — I10 Essential (primary) hypertension: Secondary | ICD-10-CM | POA: Insufficient documentation

## 2018-07-06 DIAGNOSIS — F0390 Unspecified dementia without behavioral disturbance: Secondary | ICD-10-CM | POA: Insufficient documentation

## 2018-07-06 DIAGNOSIS — R4182 Altered mental status, unspecified: Secondary | ICD-10-CM | POA: Diagnosis present

## 2018-07-06 LAB — URINALYSIS, COMPLETE (UACMP) WITH MICROSCOPIC
BILIRUBIN URINE: NEGATIVE
GLUCOSE, UA: NEGATIVE mg/dL
HGB URINE DIPSTICK: NEGATIVE
Ketones, ur: NEGATIVE mg/dL
LEUKOCYTES UA: NEGATIVE
NITRITE: NEGATIVE
PH: 8 (ref 5.0–8.0)
Protein, ur: NEGATIVE mg/dL
SPECIFIC GRAVITY, URINE: 1.011 (ref 1.005–1.030)

## 2018-07-06 LAB — COMPREHENSIVE METABOLIC PANEL
ALBUMIN: 4.1 g/dL (ref 3.5–5.0)
ALK PHOS: 74 U/L (ref 38–126)
ALT: 15 U/L (ref 0–44)
AST: 17 U/L (ref 15–41)
Anion gap: 10 (ref 5–15)
BILIRUBIN TOTAL: 0.3 mg/dL (ref 0.3–1.2)
BUN: 13 mg/dL (ref 8–23)
CALCIUM: 9 mg/dL (ref 8.9–10.3)
CO2: 25 mmol/L (ref 22–32)
Chloride: 109 mmol/L (ref 98–111)
Creatinine, Ser: 0.92 mg/dL (ref 0.44–1.00)
GFR calc Af Amer: >60 mL/min (ref >60)
GFR calc non Af Amer: >60 mL/min (ref >60)
GLUCOSE: 87 mg/dL (ref 70–99)
Potassium: 3.8 mmol/L (ref 3.5–5.1)
Sodium: 144 mmol/L (ref 135–145)
TOTAL PROTEIN: 7.5 g/dL (ref 6.5–8.1)

## 2018-07-06 LAB — CBC WITH DIFFERENTIAL/PLATELET
BASOS ABS: 0 10*3/uL (ref 0.0–0.1)
BASOS PCT: 0 %
EOS PCT: 1 %
Eosinophils Absolute: 0 10*3/uL (ref 0.0–0.7)
HCT: 38.6 % (ref 36.0–46.0)
Hemoglobin: 12.6 g/dL (ref 12.0–15.0)
Lymphocytes Relative: 29 %
Lymphs Abs: 1.7 10*3/uL (ref 0.7–4.0)
MCH: 30.1 pg (ref 26.0–34.0)
MCHC: 32.6 g/dL (ref 30.0–36.0)
MCV: 92.3 fL (ref 78.0–100.0)
MONO ABS: 0.3 10*3/uL (ref 0.1–1.0)
Monocytes Relative: 5 %
Neutro Abs: 3.7 10*3/uL (ref 1.7–7.7)
Neutrophils Relative %: 65 %
Platelets: 239 10*3/uL (ref 150–400)
RBC: 4.18 MIL/uL (ref 3.87–5.11)
RDW: 13.8 % (ref 11.5–15.5)
WBC: 5.7 10*3/uL (ref 4.0–10.5)

## 2018-07-06 MED ORDER — LAMOTRIGINE 100 MG PO TABS: 200.0000 mg | ORAL_TABLET | Freq: Two times a day (BID) | ORAL | Status: DC
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
## 2018-07-06 NOTE — ED Triage Notes (Signed)
Pt via EMS from home.  Spouse reports new onset confusion three days ago that has been getting worse.  Spouse also would like help with case worker to possibly get pt placement.  Pt reports hx of stroke with aphasia.

## 2018-07-06 NOTE — ED Provider Notes (Signed)
[facility]-EMERGENCY DEPT Provider Note   CSN: [id number] Arrival date & time: 07/06/18  1625     History   Chief Complaint Chief Complaint  Patient presents with  . Altered Mental Status    HPI Angela Fields is a 67 y.o. female with a past medical history of DM, HTN, HLD, Seizures, stroke, who presents today for evaluation of three days of worsening confusion.  She reports that she is here because her spouse is tired of her and wants her out of the house.  There is no reports that her spouse stated she had new onset of confusion 3 days ago that has been getting worse.  Patient has a history of stroke with aphasia.  Patient denies any physical complaints or concerns at this time.  Limited history secondary to dementia, AMS. HPI  Past Medical History:  Diagnosis Date  . Dementia   . Diabetes mellitus without complication (HCC)   . Hyperlipidemia   . Hypertension   . Seizures (HCC)   . Stroke [facility])     Patient Active Problem List   Diagnosis Date Noted  . Hypoglycemia 07/30/2017  . Cognitive and behavioral changes 06/26/2017  . Seizure (HCC) 06/19/2017  . Status epilepticus (HCC)   . Bradycardia   . Hypokalemia   . Hypertension 09/24/2016  . History of CVA (cerebrovascular accident) 09/24/2016  . 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 Hyperlipidemia 09/24/2016    No past surgical history on file.   OB History   None      Home Medications    Prior to Admission medications   Medication Sig Start Date End Date Taking? Authorizing Provider  aspirin EC 81 MG EC tablet Take 1 tablet (81 mg total) by mouth daily. 08/05/17  Yes Osvaldo Shipper, MD  linaclotide [facility]) 72 MCG capsule Take 72 mcg by mouth daily before breakfast.   Yes [provider]  acetaminophen (TYLENOL 8 HOUR ARTHRITIS PAIN) 650 MG CR tablet Take 1 tablet (650 mg total) by mouth every 8 (eight) hours as needed for pain. Patient not  taking: Reported on 07/06/2018 11/07/17   Charm Rings, NP  clonazePAM (KLONOPIN) 1 MG tablet Take 1 mg by mouth 3 (three) times daily.  05/23/17   [provider]  feeding supplement, ENSURE ENLIVE, (ENSURE ENLIVE) LIQD Take 237 mLs by mouth 3 (three) times daily between meals. Patient not taking: Reported on 07/06/2018 08/05/17   Osvaldo Shipper, MD  felodipine (PLENDIL) 2.5 MG 24 hr tablet Take 2.5 mg by mouth daily. 10/25/17   [provider]  folic acid (FOLVITE) 1 MG tablet Take 1 mg by mouth daily. 03/07/18   [provider]  lacosamide 150 MG TABS Take 1 tablet (150 mg total) by mouth 2 (two) times daily. 08/05/17   Osvaldo Shipper, MD  LamoTRIgine 200 MG TB24 24 hour tablet Take 200 mg by mouth 2 (two) times daily. 03/11/18   [provider]  metoprolol tartrate (LOPRESSOR) 25 MG tablet Take 25 mg by mouth 2 (two) times daily. 03/01/18   [provider]  NUEDEXTA 20-10 MG CAPS Take 1 tablet by mouth daily. 03/11/18   [provider]  potassium chloride (MICRO-K) 10 MEQ CR capsule Take 10 mEq by mouth daily. 02/12/18   [provider]  potassium chloride SA (K-DUR,KLOR-CON) 20 MEQ tablet Take 1 tablet (20 mEq total) by mouth daily. Patient not taking: Reported on 07/06/2018 03/16/18   Pricilla Loveless, MD  pravastatin (  PRAVACHOL) 10 MG tablet Take 10 mg by mouth at bedtime. 03/14/18   [provider]  QUEtiapine (SEROQUEL) 25 MG tablet Take 25 mg by mouth 2 (two) times daily.  02/13/18   [provider]  traZODone (DESYREL) 50 MG tablet Take 1 tablet (50 mg total) by mouth at bedtime. Patient not taking: Reported on 07/06/2018 06/26/17   Charm RingsLord, Jamison Y, NP  valsartan-hydrochlorothiazide (DIOVAN-HCT) 320-25 MG tablet Take 1 tablet by mouth daily. 02/12/18   [provider]  white petrolatum ointment Apply 1 application topically 3 (three) times daily as needed for dry skin.    [provider]    Family  History No family history on file.  Social History Social History   Tobacco Use  . Smoking status: Never Smoker  . Smokeless tobacco: Never Used  Substance Use Topics  . Alcohol use: No  . Drug use: No     Allergies   Patient has no known allergies.   Review of Systems Review of Systems  Unable to perform ROS: Dementia     Physical Exam Updated Vital Signs BP (!) 170/81 (BP Location: Right Arm)   Pulse 62   Temp 97.7 F (36.5 C) (Oral)   Resp 18   SpO2 99%   Physical Exam  Constitutional: She appears well-developed and well-nourished. No distress.  HENT:  Head: Normocephalic and atraumatic.  Eyes: Conjunctivae are normal.  Neck: Neck supple.  Cardiovascular: Normal rate and regular rhythm.  No murmur heard. Pulmonary/Chest: Effort normal and breath sounds normal. No respiratory distress.  Abdominal: Soft. There is no tenderness.  Musculoskeletal: She exhibits no edema.  Neurological: She is alert.  Discussion with patient she is able to answer many questions, including those about the environment around her without speech difficulty or recall occultly.  She is able to answer physical questions but when it comes to orientation questions she   Skin: Skin is warm and dry.  Psychiatric: She has a normal mood and affect.  Nursing note and vitals reviewed.    ED Treatments / Results  Labs (all labs ordered are listed, but only abnormal results are displayed) Labs Reviewed  URINALYSIS, COMPLETE (UACMP) WITH MICROSCOPIC - Abnormal; Notable for the following components:      Result Value   Color, Urine STRAW (*)    Bacteria, UA RARE (*)    All other components within normal limits  URINE CULTURE  COMPREHENSIVE METABOLIC PANEL  CBC WITH DIFFERENTIAL/PLATELET    EKG EKG Interpretation  Date/Time:  Monday July 06 2018 19:59:12 EDT Ventricular Rate:  64 PR Interval:    QRS Duration: 111 QT Interval:  446 QTC Calculation: 461 R Axis:   0 Text  Interpretation:  Sinus rhythm Consider left atrial enlargement Artifact in lead(s) I II aVR aVL aVF V5 V6 Confirmed by Kennis CarinaBero, Michael [phone]) on 07/06/2018 9:15:15 PM Also confirmed by Kennis CarinaBero, Michael [phone]), editor Barbette Hairassel, Kerry [phone]([id number])  on 07/07/2018 6:55:24 AM   Radiology Ct Head Wo Contrast  Result Date: 07/06/2018 CLINICAL DATA:  New onset confusion 3 days ago progressively worsening. EXAM: CT HEAD WITHOUT CONTRAST TECHNIQUE: Contiguous axial images were obtained from the base of the skull through the vertex without intravenous contrast. COMPARISON:  CT studies dating back through 09/24/2016 FINDINGS: Brain: Encephalomalacia and volume loss involving the left posterior parietal and occipital lobes with ex vacuo dilatation of the adjacent left lateral ventricle posteriorly. Moderate degree of small vessel ischemia is redemonstrated of the periventricular white matter. No large vascular  territory infarction, hemorrhage or midline shift. Midline fourth ventricle and basal cisterns without effacement. No extra-axial fluid collections. Vascular: No hyperdense vessel sign. 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 Skull: No acute skull fracture. Sinuses/Orbits: Paranasal sinuses are clear. Intact included orbits and globes. Other: Mastoids are clear. IMPRESSION: Chronic encephalomalacia involving the left posterior parietal and occipital lobes with ex vacuo dilatation of the adjacent left lateral ventricle. Moderate degree of chronic small vessel ischemia persists without significant progression identified. No acute intracranial abnormality is noted. Electronically Signed   By: Tollie Eth M.D.   On: 07/06/2018 19:31    Procedures Procedures (including critical care time)  Medications Ordered in ED Medications - No data to display   Initial Impression / Assessment and Plan / ED Course  I have reviewed the triage vital signs and the nursing notes.  Pertinent  labs & imaging results that were available during my care of the patient were reviewed by me and considered in my medical decision making (see chart for details).  Clinical Course as of Jul 08 101  Mon Jul 06, 2018  2053 Attempted to contact spouse at number listed in the chart, no contact.     [EH]  2349 Spent extensive time on the phone with patient's husband.  He attempted to give me multiple reasons why he couldn't take patient home and then to negatiate having [place] keep her over night.  Husband had a lot of noise in the background including multiple people speaking.  Social work Christiane Ha will call husband.    [EH]    Clinical Course User Index [EH] Cristina Gong, [place]   Patient presents today for evaluation of reported 1 to 3 days of worsening confusion.  Her spouse dropped her off at the ER hoping to get patient placed in a facility.  Medical work-up including UA, CT head, and labs did not reveal a medical cause for her reportedly worsening confusion.  Patient appears to blame her gaps in memory from her dementia on her stroke by saying that she is a phasic, however does not have difficulty when answering simple questions that do not require intact recent memory.   This patient was seen as a shared visit with Dr. Pilar Plate.  I spoke with patient's husband who gave multiple reasons why he felt he would be unable to pick her up.  Social work became involved and was able to convince him to come pick the patient up.  Since husband was given resources and discussion with social work about the appropriate way to get patient placed into a facility.  Patient will be discharged home with her husband.  Final Clinical Impressions(s) / ED Diagnoses   Final diagnoses:  Confusion  Dementia with behavioral disturbance, unspecified dementia type    ED Discharge Orders    None       Cristina Gong, PA-C 07/08/18 0105    Sabas Sous, MD 07/09/18 [phone]

## 2018-07-06 NOTE — ED Notes (Signed)
Bed: WLPT4 Expected date:  Expected time:  Means of arrival:  Comments: 

## 2018-07-07 MED ORDER — CLONAZEPAM 0.5 MG PO TABS: 1.0000 mg | ORAL_TABLET | Freq: Three times a day (TID) | ORAL | Status: DC

## 2018-07-07 MED ORDER — LAMOTRIGINE 100 MG PO TABS: 200.0000 mg | ORAL_TABLET | Freq: Two times a day (BID) | ORAL | Status: DC

## 2018-07-07 NOTE — Progress Notes (Addendum)
On 6/3 the CSW provided pt's husband with a list of Assisted Living Facilities in the [place] area and counseled pt on legal guardianship and how it is sometimes attained.  CSW counseled pt's husband on contacting APS and how to file a report and CSW provided pt's husband with the after-hours APS hotline.  On 6/3 the pt's husband was appreciative and thanked the CSW.  Today on 9/24 the CSW reviewed the above information with the pt's husband who stated he wouldn't come to the hospital to p/u the wife and the CSW stated that the CSW would be forced to file an APS report and alert APS that the pt's husband had abandoned the pt in the ED and is refusing to pick her up.  CSW then continued when the pt's husband refused to p/u the pt and stated he would call the CSW back.  CSW stated that APS reports reflected pt's families as refusing to pick uop their family members as "abandoning their family" and if the pt's husband was interested in guardianship as the pt's husband and the CSW had discussed on 6/3 then the CSW stated it might be considered helpful to not have a history of abandonment in regards to the pt when trying to gain guardianship in the future.  CSW stated if the pt's husband did not call back or pick up the pt then the CSW would request that [employer] come to his home for a wellness check to remind him the pt's wife is in the ED and is ready for pick up.  Pt's husband stated, "Go head then and follow through on your threat". CSW stated this is not a threat that this is the process that the CSW is trained to follow and again asked if the pt's husband was coming to pick up the pt or if he is not and the pt's husband said, "I gave you an answer, follow through on your threat".  CSW stated that the CSW needed a to know if the pt's husband is going to pick up the pt and the pt's husband again stated to the CSW, "Follow through on your threat".  CSW stated, "Okay, thank you" and CSW hung up  the phone.  CSW will continue to follow for D/C needs.  Dorothe PeaJonathan F. Laylonie Marzec, LCSW, LCAS, CSI Clinical Social Worker Ph: [phone]

## 2018-07-07 NOTE — Progress Notes (Signed)
CSW met with the pt who was agreeable to D/C'ing home with the pt's husband and CSW called and spoke to the pt's husband who talked to the pt on the CSW's phone.  Pt's husband told the CSW that he was agreeable to picking up the pt and that he stated that in the future he didn't want to be threatened "if I have dealings with you again".  CSW reiterated that the CSW was informing him of the next step he was required to take if the pt was not picked up by family but that the CSW apologized if the implication of a threat was perceived by the pt's husband and that this was not meant by the CSW as a threat.  CSW then apologized again, thanked the pt's husband for picking up the pt and informed him the RN will prepare the pt for D/C to help the pt's husband avoid a wait when he arrived.  CSW updated the RN/EPD that the pt's husband was en route.  Please reconsult if future social work needs arise.  CSW signing off, as social work intervention is no longer needed.  Angela Guild. Kessler Kopinski, LCSW, LCAS, CSI Clinical Social Worker Ph: [phone]

## 2018-07-07 NOTE — ED Provider Notes (Signed)
Patient with a H/o dementia Here for confusion that started today, per husband Patient at baseline, work up negative Patient stable Husband left during work up and is not immediately available to come pick her up from the ED.  SW has been in contact and husband is aware he needs to come get patient Can discharge when he arrives If he does not come to get her, SW will need to be re-involved in am, possible involvement with APS.   Patient's husband in the ED. The patient is re-evaluated by Dr. Pilar PlateBero who facilitates discharge.    Elpidio AnisUpstill, Nohea Kras, PA-C 07/07/18 [phone]    Sabas SousBero, Michael M, MD 07/09/18 [phone]

## 2018-07-07 NOTE — Discharge Instructions (Addendum)
You were evaluated in the Emergency Department and after careful evaluation, we did not find any emergent condition requiring admission or further testing in the hospital.  Please discuss her needs with primary care doctor and neurology.   Please return to the Emergency Department if you experience any worsening of your condition.  We encourage you to follow up with a primary care provider.  Thank you for allowing us to be a part of your care.

## 2018-07-07 NOTE — Care Management Note (Signed)
Case Management Note  CM consulted for unknown reason.  No information in notes or consult order.  CM noted CSW was highly involved in pt's transition planning.  No further CM needs noted.  Anelisse Jacobson, Lynnae SandhoffAngela N, RN 07/07/2018, 9:20 AM

## 2018-07-08 LAB — URINE CULTURE

## 2018-07-09 ENCOUNTER — Telehealth: Payer: Self-pay | Admitting: *Deleted

## 2018-07-09 NOTE — Telephone Encounter (Signed)
Post ED Visit - Positive Culture Follow-up  Culture report reviewed by antimicrobial stewardship pharmacist:  []  Enzo Bi, Pharm.D. []  Celedonio Miyamoto, Pharm.D., BCPS AQ-ID []  Garvin Fila, Pharm.D., BCPS []  Georgina Pillion, Pharm.D., BCPS []  [place], [address].D., BCPS, AAHIVP []  Estella Husk, Pharm.D., BCPS, AAHIVP [x]  Lysle Pearl, PharmD, BCPS []  Phillips Climes, PharmD, BCPS []  Agapito Games, PharmD, BCPS []  Verlan Friends, PharmD  Positive urine culture UA with rare bacteria, afebrile and no further patient follow-up is required at this time.  Virl Axe [facility] 07/09/2018, 11:13 AM

## 2018-08-25 ENCOUNTER — Ambulatory Visit: Payer: Self-pay | Admitting: Family Medicine

## 2018-08-25 ENCOUNTER — Encounter: Payer: Self-pay | Admitting: Medical

## 2018-08-25 ENCOUNTER — Ambulatory Visit (INDEPENDENT_AMBULATORY_CARE_PROVIDER_SITE_OTHER): Payer: Medicare HMO | Admitting: Medical

## 2018-08-25 VITALS — BP 145/80 | HR 66 | Temp 98.2°F | Resp 16 | Ht 66.0 in | Wt 158.4 lb

## 2018-08-25 DIAGNOSIS — Z8673 Personal history of transient ischemic attack (TIA), and cerebral infarction without residual deficits: Secondary | ICD-10-CM | POA: Diagnosis not present

## 2018-08-25 DIAGNOSIS — F419 Anxiety disorder, unspecified: Secondary | ICD-10-CM

## 2018-08-25 DIAGNOSIS — R569 Unspecified convulsions: Secondary | ICD-10-CM | POA: Diagnosis not present

## 2018-08-25 DIAGNOSIS — I10 Essential (primary) hypertension: Secondary | ICD-10-CM | POA: Diagnosis not present

## 2018-08-25 DIAGNOSIS — F0390 Unspecified dementia without behavioral disturbance: Secondary | ICD-10-CM

## 2018-08-25 MED ORDER — LAMOTRIGINE ER 200 MG PO TB24: 200.0000 mg | ORAL_TABLET | Freq: Two times a day (BID) | ORAL | 3 refills | Status: DC

## 2018-08-25 MED ORDER — LACOSAMIDE 150 MG PO TABS: 150.0000 mg | ORAL_TABLET | Freq: Two times a day (BID) | ORAL | 3 refills | Status: DC

## 2018-08-25 MED ORDER — LACOSAMIDE 150 MG PO TABS: 150.0000 mg | ORAL_TABLET | Freq: Two times a day (BID) | ORAL | 0 refills | Status: DC

## 2018-08-25 NOTE — Progress Notes (Signed)
Subjective:    Patient ID: Angela Fields, female    DOB: Nov 17, 1950, 67 y.o.   MRN: [id number]  HPI  Pt in for first time.  Pt states she feels well today and recently.  Pt is retired. Pt used to be [employer] operational officer. She does walk about one time a week.Eats moderate  healthy. Married.  Pt has late onset Alzheimers. Pt sees neurologist. Pt was seen on august, 2019. Pt dementia started around time of former stroke.  Pt had stroke in 2010.   Pt has hx of seizures. They have been under controlled. Pt in past would have seizures if missed dose. Per pt former pcp would miss refilling meds.    Pt has history of hyperlipidemia. Pt is on pravastatin.   Pt has hx of htn as well.   She has history of some anxiety in the past.   Pt does have anxiety. He is on 1 mg clonazepam 3 times daily. Also per husband helps control her seizures. Pt states in past neurologist locally wrote her this way. Actually she is using 1/2 tab po tid as needed.(husband aware I will rx as she takes)     Review of Systems  Constitutional: Negative for chills, fatigue and fever.  HENT: Negative for congestion, ear discharge and ear pain.   Respiratory: Negative for cough, chest tightness, shortness of breath and wheezing.   Cardiovascular: Negative for chest pain and palpitations.  Gastrointestinal: Negative for abdominal pain.  Genitourinary: Negative for difficulty urinating, dysuria, enuresis, flank pain and frequency.  Musculoskeletal: Negative for back pain.  Skin: Negative for rash.  Neurological: Positive for seizures. Negative for dizziness, tremors, speech difficulty, weakness and headaches.       Aphasia.  Hematological: Negative for adenopathy. Does not bruise/bleed easily.  Psychiatric/Behavioral: Negative for behavioral problems, confusion, dysphoric mood, sleep disturbance and suicidal ideas. The patient is nervous/anxious.     Past Medical History:  Diagnosis Date  . Dementia (HCC)   . Diabetes  mellitus without complication (HCC)   . Hyperlipidemia   . Hypertension   . Seizures (HCC)   . Stroke [facility]      Social History   Socioeconomic History  . Marital status: Married    Spouse name: Not on file  . Number of children: Not on file  . Years of education: Not on file  . Highest education level: Not on file  Occupational History  . Not on file  Social Needs  . Financial resource strain: Not on file  . Food insecurity:    Worry: Not on file    Inability: Not on file  . Transportation needs:    Medical: Not on file    Non-medical: Not on file  Tobacco Use  . Smoking status: Never Smoker  . Smokeless tobacco: Never Used  Substance and Sexual Activity  . Alcohol use: No  . Drug use: No  . Sexual activity: Not on file  Lifestyle  . Physical activity:    Days per week: Not on file    Minutes per session: Not on file  . Stress: Not on file  Relationships  . Social connections:    Talks on phone: Not on file    Gets together: Not on file    Attends religious service: Not on file    Active member of club or organization: Not on file    Attends meetings of clubs or organizations: Not on file    Relationship status: Not on file  . Intimate  partner violence:    Fear of current or ex partner: Not on file    Emotionally abused: Not on file    Physically abused: Not on file    Forced sexual activity: Not on file  Other Topics Concern  . Not on file  Social History Narrative  . Not on file    No past surgical history on file.  No family history on file.  No Known Allergies  Current Outpatient Medications on File Prior to Visit  Medication Sig Dispense Refill  . acetaminophen (TYLENOL 8 HOUR ARTHRITIS PAIN) 650 MG CR tablet Take 1 tablet (650 mg total) by mouth every 8 (eight) hours as needed for pain. 30 tablet 0  . Ascorbic Acid (VITAMIN C) 1000 MG tablet Take 1,000 mg by mouth once a week.     Marland Kitchen aspirin EC 81 MG EC tablet Take 1 tablet (81 mg total) by  mouth daily. 30 tablet 0  . cholecalciferol (VITAMIN D3) 25 MCG (1000 UT) tablet Take 1,000 Units by mouth once a week.    . clonazePAM (KLONOPIN) 1 MG tablet Take 1 mg by mouth 3 (three) times daily.   0  . felodipine (PLENDIL) 2.5 MG 24 hr tablet Take 2.5 mg by mouth daily.    . folic acid (FOLVITE) 1 MG tablet Take 1 mg by mouth daily.    . Lacosamide 150 MG TABS Take 150 mg by mouth 2 (two) times daily.    . LamoTRIgine 200 MG TB24 24 hour tablet Take 200 mg by mouth 2 (two) times daily.    Marland Kitchen linaclotide (LINZESS) 72 MCG capsule Take 72 mcg by mouth daily before breakfast.    . potassium chloride (MICRO-K) 10 MEQ CR capsule Take 10 mEq by mouth daily.    . pravastatin (PRAVACHOL) 10 MG tablet Take 10 mg by mouth at bedtime.    Marland Kitchen QUEtiapine (SEROQUEL) 25 MG tablet Take 25 mg by mouth 2 (two) times daily.   3  . traZODone (DESYREL) 50 MG tablet Take 1 tablet (50 mg total) by mouth at bedtime. 30 tablet 0  . valsartan-hydrochlorothiazide (DIOVAN-HCT) 320-25 MG tablet Take 1 tablet by mouth daily.    . white petrolatum ointment Apply 1 application topically 3 (three) times daily as needed for dry skin.     No current facility-administered medications on file prior to visit.     BP (!) 152/56   Pulse 66   Temp 98.2 F (36.8 C) (Oral)   Resp 16   Ht 5\' 6"  (1.676 m)   Wt 158 lb 6.4 oz (71.8 kg)   SpO2 100%   BMI 25.57 kg/m       Objective:   Physical Exam  General Mental Status- Alert. General Appearance- Not in acute distress.   Skin General: Color- Normal Color. Moisture- Normal Moisture.  Neck Carotid Arteries- Normal color. Moisture- Normal Moisture. No carotid bruits. No JVD.  Chest and Lung Exam Auscultation: Breath Sounds:-Normal.  Cardiovascular Auscultation:Rythm- Regular. Murmurs & Other Heart Sounds:Auscultation of the heart reveals- No Murmurs.  Abdomen Inspection:-Inspeection Normal. Palpation/Percussion:Note:No mass. Palpation and Percussion of the  abdomen reveal- Non Tender, Non Distended + BS, no rebound or guarding.   Neurologic Cranial Nerve exam:- CN III-XII intact(No nystagmus), symmetric smile.( pt has aphasia moderate) Strength:- 5/5 equal and symmetric strength both upper and lower extremities.      Assessment & Plan:  For your seizures will continue current meds. Call early/one week in advance before running out of meds.  For htn continue current meds. Check bp at home to understand baseline bp less than 140/90 ideally.   For hx of stroke control risk factors.  For anxiety, stay on clonazepam.   For dementia, keep following up with neurologist and follow recommendation.  Follow up in one month or as needed  45 minutes spent with pt. 50% of time spent with pt  Counseling and diiscussing management plan for chronic medical problems. Answered all questions for both wife and husband.  Esperanza Richters, PA-C

## 2018-08-25 NOTE — Patient Instructions (Addendum)
For your seizures will continue current meds. Call early/one week in advance before running out of meds.  For htn continue current meds. Check bp at home to understand baseline bp less than 140/90 ideally.   For hx of stroke control risk factors.  For high cholesterol will repeat lipid panel fasting.  For anxiety, stay on clonazepam.   For dementia, keep following up with neurologist and follow recommendation.  Follow up in one month or as needed

## 2018-08-28 ENCOUNTER — Telehealth: Payer: Self-pay

## 2018-08-28 NOTE — Telephone Encounter (Signed)
Copied from CRM [id number]. Topic: General - Other >> Aug 25, 2018  3:56 PM Percival SpanishKennedy, Cheryl W wrote:  Pt said Pharmacy would not give them the name of the last pneumonia injection said [facility]

## 2018-09-02 ENCOUNTER — Encounter: Payer: Self-pay | Admitting: Family Medicine

## 2018-09-02 ENCOUNTER — Ambulatory Visit (INDEPENDENT_AMBULATORY_CARE_PROVIDER_SITE_OTHER): Payer: Medicare HMO | Admitting: Family Medicine

## 2018-09-02 VITALS — BP 110/78 | HR 61 | Temp 98.4°F | Ht 66.0 in | Wt 158.0 lb

## 2018-09-02 DIAGNOSIS — R251 Tremor, unspecified: Secondary | ICD-10-CM | POA: Diagnosis not present

## 2018-09-02 DIAGNOSIS — R319 Hematuria, unspecified: Secondary | ICD-10-CM | POA: Diagnosis not present

## 2018-09-02 LAB — URINALYSIS, ROUTINE W REFLEX MICROSCOPIC
BILIRUBIN URINE: NEGATIVE
Ketones, ur: NEGATIVE
Nitrite: NEGATIVE
Specific Gravity, Urine: >=1.03 — AB (ref 1.000–1.030)
Total Protein, Urine: NEGATIVE
Urine Glucose: NEGATIVE
Urobilinogen, UA: 0.2 (ref 0.0–1.0)
pH: 5.5 (ref 5.0–8.0)

## 2018-09-02 LAB — CBC
HEMATOCRIT: 38.9 % (ref 36.0–46.0)
HEMOGLOBIN: 12.9 g/dL (ref 12.0–15.0)
MCHC: 33.1 g/dL (ref 30.0–36.0)
MCV: 89.6 fl (ref 78.0–100.0)
PLATELETS: 234 10*3/uL (ref 150.0–400.0)
RBC: 4.34 Mil/uL (ref 3.87–5.11)
RDW: 14.3 % (ref 11.5–15.5)
WBC: 5.1 10*3/uL (ref 4.0–10.5)

## 2018-09-02 LAB — COMPREHENSIVE METABOLIC PANEL
ALBUMIN: 4.5 g/dL (ref 3.5–5.2)
ALK PHOS: 90 U/L (ref 39–117)
ALT: 12 U/L (ref 0–35)
AST: 13 U/L (ref 0–37)
BILIRUBIN TOTAL: 0.4 mg/dL (ref 0.2–1.2)
BUN: 13 mg/dL (ref 6–23)
CALCIUM: 9.3 mg/dL (ref 8.4–10.5)
CO2: 29 meq/L (ref 19–32)
CREATININE: 1.11 mg/dL (ref 0.40–1.20)
Chloride: 105 mEq/L (ref 96–112)
GFR: 62.54 mL/min (ref >60.00)
Glucose, Bld: 88 mg/dL (ref 70–99)
Potassium: 4.4 mEq/L (ref 3.5–5.1)
Sodium: 142 mEq/L (ref 135–145)
Total Protein: 6.8 g/dL (ref 6.0–8.3)

## 2018-09-02 LAB — POC URINALSYSI DIPSTICK (AUTOMATED)
Bilirubin, UA: NEGATIVE
Glucose, UA: NEGATIVE
KETONES UA: NEGATIVE
Leukocytes, UA: NEGATIVE
Nitrite, UA: NEGATIVE
PROTEIN UA: POSITIVE — AB
Urobilinogen, UA: 0.2 E.U./dL
pH, UA: 6 (ref 5.0–8.0)

## 2018-09-02 LAB — TSH: TSH: 1.23 u[IU]/mL (ref 0.35–4.50)

## 2018-09-02 NOTE — Patient Instructions (Addendum)
Give us 2-3 business days to get the results of your labs back.   If things get worse, go to the ER.  Stay hydrated.  Reach out to your neurologist to see if he thinks any of the medications he prescribes could be contributing.  Cancel appointment if we are doing better.  Let us know if you need anything.

## 2018-09-02 NOTE — Progress Notes (Signed)
Chief Complaint  Patient presents with  . Follow-up    Subjective: Patient is a 67 y.o. female here for shaking. Pt w hx of dementia, spouse provides most of hx.   This AM, woke up spouse stating she was shaky and needed to see doctor.  Patient has been unsteady on her feet as well.  She is normally ambulatory without any assistance.  Over past 3 d, has been more agitated w spouse. Denies any illness, fevers, diarrhea, urinary complaints, rashes, URI s/s's, or med changes. This has happened in past, but nothing was found. Eating/drinking normally otherwise.   ROS: Heart: Denies chest pain  Lungs: Denies SOB   Past Medical History:  Diagnosis Date  . Anxiety   . Dementia (HCC)   . Diabetes mellitus without complication (HCC)   . Hyperlipidemia   . Hypertension   . Seizures (HCC)   . Stroke [facility](HCC)     Objective: BP 110/78 (BP Location: Right Arm, Patient Position: Sitting, Cuff Size: Normal)   Pulse 61   Temp 98.4 F (36.9 C) (Oral)   Ht 5\' 6"  (1.676 m)   Wt 158 lb (71.7 kg)   SpO2 97%   BMI 25.50 kg/m  General: Awake, appears stated age HEENT: MMM, EOMi Heart: RRR, no murmurs Lungs: CTAB, no rales, wheezes or rhonchi. No accessory muscle use  GI: Bowel sounds present, soft, nontender, nondistended, no masses or organomegaly Neuro: Unsteady gait. Psych: Limited judgment and insight, normal affect and mood  Assessment and Plan: Shaking - Plan: CBC, Comprehensive metabolic panel, TSH, POCT Urinalysis Dipstick (Automated), Urine Culture, Urinalysis, Routine w reflex microscopic  Hematuria, unspecified type - Plan: POCT Urinalysis Dipstick (Automated), Urine Culture, Urinalysis, Routine w reflex microscopic  Check above, there was some blood in her urine.  Her husband does report history of hemorrhoids that could be contributing.  We will check culture and urinalysis.  Hold off on treatment until these come back.  We will also check labs. Follow-up in 1 week with regular  PCP if no improvement. The patient and her husband voiced understanding and agreement to the plan.  Jilda Rocheicholas Paul [place], DO 09/02/18  10:42 AM

## 2018-09-02 NOTE — Progress Notes (Signed)
Pre visit review using our clinic review tool, if applicable. No additional management support is needed unless otherwise documented below in the visit note. 

## 2018-09-04 ENCOUNTER — Other Ambulatory Visit: Payer: Self-pay | Admitting: Family Medicine

## 2018-09-04 ENCOUNTER — Other Ambulatory Visit: Payer: Self-pay | Admitting: Medical

## 2018-09-04 DIAGNOSIS — Z1231 Encounter for screening mammogram for malignant neoplasm of breast: Secondary | ICD-10-CM

## 2018-09-04 LAB — URINE CULTURE
MICRO NUMBER:: 91398916
SPECIMEN QUALITY:: ADEQUATE

## 2018-09-04 MED ORDER — CEPHALEXIN 500 MG PO CAPS: 500.0000 mg | ORAL_CAPSULE | Freq: Two times a day (BID) | ORAL | 0 refills | Status: AC

## 2018-09-07 NOTE — Progress Notes (Signed)
Reviewed  Yvonne R Lowne Chase, DO  

## 2018-09-08 ENCOUNTER — Encounter: Payer: Self-pay | Admitting: Family Medicine

## 2018-09-08 ENCOUNTER — Ambulatory Visit (INDEPENDENT_AMBULATORY_CARE_PROVIDER_SITE_OTHER): Payer: Medicare HMO | Admitting: Family Medicine

## 2018-09-08 VITALS — BP 124/78 | HR 80 | Temp 98.2°F | Ht 66.0 in | Wt 160.1 lb

## 2018-09-08 DIAGNOSIS — Z09 Encounter for follow-up examination after completed treatment for conditions other than malignant neoplasm: Secondary | ICD-10-CM

## 2018-09-08 NOTE — Progress Notes (Signed)
Pre visit review using our clinic review tool, if applicable. No additional management support is needed unless otherwise documented below in the visit note. 

## 2018-09-08 NOTE — Patient Instructions (Signed)
Finish the antibiotic (Keflex).   Stay hydrated.  Let us know if you need anything.

## 2018-09-08 NOTE — Progress Notes (Signed)
Chief Complaint  Patient presents with  . Follow-up    Subjective: Patient is a 67 y.o. female here for follow-up.  She is here with her husband.  She was having shaking, altered mental status, and weakness 1 week ago.  Lab work was unremarkable but she did have a urine culture suggesting infection.  She was started on Keflex and 1 day later she started returning to her baseline.  She feels well today.  Per her husband, she is back to her baseline as well.   ROS: GU: No dysuria  Past Medical History:  Diagnosis Date  . Anxiety   . Dementia (HCC)   . Diabetes mellitus without complication (HCC)   . Hyperlipidemia   . Hypertension   . Seizures (HCC)   . Stroke (HCC)     Objective: BP 124/78 (BP Location: Left Arm, Patient Position: Sitting, Cuff Size: Normal)   Pulse 80   Temp 98.2 F (36.8 C) (Oral)   Ht 5\' 6"  (1.676 m)   Wt 160 lb 2 oz (72.6 kg)   SpO2 96%   BMI 25.84 kg/m  General: Awake, appears stated age HEENT: MMM, EOMi Heart: RRR, no murmurs Lungs: CTAB, no rales, wheezes or rhonchi. No accessory muscle use Abdomen: Soft, nontender, nondistended, bowel sounds present Psych: limited judgment and insight, normal affect and mood  Assessment and Plan: Follow-up for resolved condition  Finish Keflex, stay hydrated. Follow-up as needed with me. The patient and her husband voiced understanding and agreement to the plan.  Jilda Rocheicholas Paul [place], DO 09/08/18  1:45 PM

## 2018-09-15 ENCOUNTER — Telehealth: Payer: Self-pay | Admitting: Medical

## 2018-09-15 ENCOUNTER — Ambulatory Visit (HOSPITAL_BASED_OUTPATIENT_CLINIC_OR_DEPARTMENT_OTHER)
Admission: RE | Admit: 2018-09-15 | Discharge: 2018-09-15 | Disposition: A | Discharge status: Home / Self Care | Payer: Medicare HMO | Source: Ambulatory Visit | Attending: Medical | Admitting: Medical

## 2018-09-15 DIAGNOSIS — Z1231 Encounter for screening mammogram for malignant neoplasm of breast: Secondary | ICD-10-CM | POA: Insufficient documentation

## 2018-09-15 DIAGNOSIS — G40909 Epilepsy, unspecified, not intractable, without status epilepticus: Secondary | ICD-10-CM

## 2018-09-15 MED ORDER — LINACLOTIDE 72 MCG PO CAPS: 72.0000 ug | ORAL_CAPSULE | Freq: Every day | ORAL | 2 refills | Status: DC

## 2018-09-15 NOTE — Telephone Encounter (Signed)
Sent rx to pharmacy

## 2018-09-15 NOTE — Telephone Encounter (Signed)
Copied from CRM [phone]. Topic: Quick Communication - See Telephone Encounter >> Sep 15, 2018 11:30 AM Valentina LucksMatos, Jackelin wrote: CRM for notification. See Telephone encounter for: 09/15/18.   Pt's spouse came in office stating that wife neurologist retired and is needing a new neurologist. Pt would like [place] be [place]. Please advise.

## 2018-09-15 NOTE — Telephone Encounter (Signed)
Pt is needing a refill on LINZESS) 72 MCG capsule. [facility] [place] Please advise.

## 2018-09-15 NOTE — Telephone Encounter (Signed)
Referral to neurologist placed.Please have pt/husband sign release form so we can get neurologist records so can refer him to new neurologist.

## 2018-09-16 NOTE — Telephone Encounter (Signed)
LVM informing pt that referral was put in but that pt needed to come by the office to fill out a release form to get her medical records from her old neurologist.

## 2018-09-21 ENCOUNTER — Other Ambulatory Visit: Payer: Self-pay | Admitting: Medical

## 2018-09-21 NOTE — Telephone Encounter (Signed)
Copied from CRM [id number]. Topic: Quick Communication - Rx Refill/Question >> Sep 21, 2018 12:30 PM Burchel, Abbi R wrote: Medication: felodipine (PLENDIL) 2.5 MG 24 hr tablet  [facility] [id number] [facility] [place] - [address] [place] [id number] Phone: [phone] Fax: [phone]    Pt was  advised that RX refills may take up to 3 business days. We ask that you follow-up with your pharmacy.

## 2018-09-23 MED ORDER — FELODIPINE ER 2.5 MG PO TB24: 2.5000 mg | ORAL_TABLET | Freq: Every day | ORAL | 0 refills | Status: DC

## 2018-09-23 NOTE — Telephone Encounter (Signed)
Requested medication (s) are due for refill today - possibly overdue  Requested medication (s) are on the active medication list - yes  Future visit scheduled -yes  Last refill: 10/25/17  Notes to clinic: patient is requesting a historical medication sent for provider review  Requested Prescriptions  Pending Prescriptions Disp Refills   felodipine (PLENDIL) 2.5 MG 24 hr tablet      Sig: Take 1 tablet (2.5 mg total) by mouth daily.     Cardiovascular:  Calcium Channel Blockers Passed - 09/22/2018 12:20 PM      Passed - Last BP in normal range    BP Readings from Last 1 Encounters:  09/08/18 124/78         Passed - Valid encounter within last 6 months    Recent Outpatient Visits          2 weeks ago [facility] for resolved condition   [facility] [place], [place] Broedy Osbourne, DO   3 weeks ago [facility]   [facility] Wendling, [place] Cheryal Salas, DO   4 weeks ago [facility]izure [facility](HCC)   [facility] Saguier, [place], PA-C      Future Appointments            Tomorrow Saguier, Ramon DredgeEdward, PA-C [facility], [facility]            Requested Prescriptions  Pending Prescriptions Disp Refills   felodipine (PLENDIL) 2.5 MG 24 hr tablet      Sig: Take 1 tablet (2.5 mg total) by mouth daily.     Cardiovascular:  Calcium Channel Blockers Passed - 09/22/2018 12:20 PM      Passed - Last BP in normal range    BP Readings from Last 1 Encounters:  09/08/18 124/78         Passed - Valid encounter within last 6 months    Recent Outpatient Visits          2 weeks ago Follow-up for resolved condition   [facility] Wendling, [place]icholas Sharline Lehane, DO   3 weeks ago [employer]   [place] [facility] Wendling, [place] Mong Neal, DO   4 weeks ago Seizure [facility](HCC)   [facility] Saguier, Val[place]Edward, [place]-C      Future Appointments            Tomorrow Saguier, Harrah's EntertainmentEdward, PA-C [facility], [facility]

## 2018-09-24 ENCOUNTER — Encounter: Payer: Self-pay | Admitting: Medical

## 2018-09-24 ENCOUNTER — Ambulatory Visit (INDEPENDENT_AMBULATORY_CARE_PROVIDER_SITE_OTHER): Payer: Medicare HMO | Admitting: Medical

## 2018-09-24 VITALS — BP 139/70 | HR 63 | Temp 98.1°F | Resp 16 | Ht 66.0 in | Wt 160.8 lb

## 2018-09-24 DIAGNOSIS — H6122 Impacted cerumen, left ear: Secondary | ICD-10-CM

## 2018-09-24 DIAGNOSIS — K5900 Constipation, unspecified: Secondary | ICD-10-CM

## 2018-09-24 DIAGNOSIS — R1032 Left lower quadrant pain: Secondary | ICD-10-CM | POA: Diagnosis not present

## 2018-09-24 LAB — CBC WITH DIFFERENTIAL/PLATELET
BASOS PCT: 0.8 % (ref 0.0–3.0)
Basophils Absolute: 0 10*3/uL (ref 0.0–0.1)
EOS ABS: 0.1 10*3/uL (ref 0.0–0.7)
EOS PCT: 2.1 % (ref 0.0–5.0)
HEMATOCRIT: 39.7 % (ref 36.0–46.0)
HEMOGLOBIN: 12.9 g/dL (ref 12.0–15.0)
Lymphocytes Relative: 30.1 % (ref 12.0–46.0)
Lymphs Abs: 1.3 10*3/uL (ref 0.7–4.0)
MCHC: 32.6 g/dL (ref 30.0–36.0)
MCV: 90.8 fl (ref 78.0–100.0)
Monocytes Absolute: 0.2 10*3/uL (ref 0.1–1.0)
Monocytes Relative: 5.1 % (ref 3.0–12.0)
NEUTROS PCT: 61.9 % (ref 43.0–77.0)
Neutro Abs: 2.7 10*3/uL (ref 1.4–7.7)
Platelets: 234 10*3/uL (ref 150.0–400.0)
RBC: 4.37 Mil/uL (ref 3.87–5.11)
RDW: 14.4 % (ref 11.5–15.5)
WBC: 4.4 10*3/uL (ref 4.0–10.5)

## 2018-09-24 LAB — COMPREHENSIVE METABOLIC PANEL
ALT: 11 U/L (ref 0–35)
AST: 13 U/L (ref 0–37)
Albumin: 4.6 g/dL (ref 3.5–5.2)
Alkaline Phosphatase: 87 U/L (ref 39–117)
BUN: 11 mg/dL (ref 6–23)
CALCIUM: 9.4 mg/dL (ref 8.4–10.5)
CO2: 32 mEq/L (ref 19–32)
Chloride: 104 mEq/L (ref 96–112)
Creatinine, Ser: 1.01 mg/dL (ref 0.40–1.20)
GFR: 69.73 mL/min (ref >60.00)
GLUCOSE: 80 mg/dL (ref 70–99)
POTASSIUM: 3.9 meq/L (ref 3.5–5.1)
SODIUM: 141 meq/L (ref 135–145)
TOTAL PROTEIN: 6.9 g/dL (ref 6.0–8.3)
Total Bilirubin: 0.4 mg/dL (ref 0.2–1.2)

## 2018-09-24 LAB — POC URINALSYSI DIPSTICK (AUTOMATED)
Bilirubin, UA: NEGATIVE
GLUCOSE UA: NEGATIVE
KETONES UA: NEGATIVE
Leukocytes, UA: NEGATIVE
Nitrite, UA: NEGATIVE
PH UA: 6.5 (ref 5.0–8.0)
PROTEIN UA: NEGATIVE
RBC UA: NEGATIVE
SPEC GRAV UA: 1.01 (ref 1.010–1.025)
Urobilinogen, UA: NEGATIVE E.U./dL — AB

## 2018-09-24 MED ORDER — LACOSAMIDE 150 MG PO TABS: 150.0000 mg | ORAL_TABLET | Freq: Two times a day (BID) | ORAL | 3 refills | Status: DC

## 2018-09-24 MED ORDER — FELODIPINE ER 2.5 MG PO TB24: 2.5000 mg | ORAL_TABLET | Freq: Every day | ORAL | 0 refills | Status: DC

## 2018-09-24 MED ORDER — CARVEDILOL 6.25 MG PO TABS: 6.2500 mg | ORAL_TABLET | Freq: Two times a day (BID) | ORAL | 1 refills | Status: DC

## 2018-09-24 NOTE — Progress Notes (Signed)
Subjective:    Patient ID: Angela Fields, female    DOB: 1951-01-15, 67 y.o.   MRN: [id number]  HPI  Pt in for acute visit. She had some left lower quadrant pain. No pain present today. Patient has on and off pain for a week or so.   Husband gave alleve and pain went away. Denies passing any gas.  No fevers, no chills, no sweats, no CVA pain, no UTI signs and symptoms and no diarrhea.  Not reporting any mid epigastric pain either.  Slight discomfort was in the left lower quadrant region yesterday and she did have small hard stool yesterday as well as smaller stool today in the morning.  On discussion she does use Linzess for history of constipation.    Review of Systems  Constitutional: Negative for chills, fatigue and fever.  Respiratory: Negative for cough, chest tightness and wheezing.   Cardiovascular: Negative for chest pain and palpitations.  Gastrointestinal: Positive for abdominal pain. Negative for diarrhea, nausea and vomiting.       Llq region.  This morning had small hard stool. Yesterday small hard stool as well. Pt is on linzess.   Genitourinary: Negative for difficulty urinating, dysuria, flank pain, frequency, hematuria, pelvic pain and urgency.  Musculoskeletal: Negative for back pain.  Skin: Negative for rash.  Neurological: Negative for dizziness and light-headedness.  Hematological: Negative for adenopathy. Does not bruise/bleed easily.  Psychiatric/Behavioral: Negative for behavioral problems and confusion.    Past Medical History:  Diagnosis Date  . Anxiety   . Dementia (HCC)   . Diabetes mellitus without complication (HCC)   . Hyperlipidemia   . Hypertension   . Seizures (HCC)   . Stroke [facility](HCC)      Social History   Socioeconomic History  . Marital status: Married    Spouse name: Not on file  . Number of children: Not on file  . Years of education: Not on file  . Highest education level: Not on file  Occupational History  . Not on file    Social Needs  . Financial resource strain: Not on file  . Food insecurity:    Worry: Not on file    Inability: Not on file  . Transportation needs:    Medical: Not on file    Non-medical: Not on file  Tobacco Use  . Smoking status: Never Smoker  . Smokeless tobacco: Never Used  Substance and Sexual Activity  . Alcohol use: No  . Drug use: No  . Sexual activity: Not on file  Lifestyle  . Physical activity:    Days per week: Not on file    Minutes per session: Not on file  . Stress: Not on file  Relationships  . Social connections:    Talks on phone: Not on file    Gets together: Not on file    Attends religious service: Not on file    Active member of club or organization: Not on file    Attends meetings of clubs or organizations: Not on file    Relationship status: Not on file  . Intimate partner violence:    Fear of current or ex partner: Not on file    Emotionally abused: Not on file    Physically abused: Not on file    Forced sexual activity: Not on file  Other Topics Concern  . Not on file  Social History Narrative  . Not on file    No past surgical history on file.  No family  history on file.  No Known Allergies  Current Outpatient Medications on File Prior to Visit  Medication Sig Dispense Refill  . acetaminophen (TYLENOL 8 HOUR ARTHRITIS PAIN) 650 MG CR tablet Take 1 tablet (650 mg total) by mouth every 8 (eight) hours as needed for pain. 30 tablet 0  . Ascorbic Acid (VITAMIN C) 1000 MG tablet Take 1,000 mg by mouth once a week.     Marland Kitchen aspirin EC 81 MG EC tablet Take 1 tablet (81 mg total) by mouth daily. 30 tablet 0  . cholecalciferol (VITAMIN D3) 25 MCG (1000 UT) tablet Take 1,000 Units by mouth once a week.    . clonazePAM (KLONOPIN) 1 MG tablet Take 1 mg by mouth 3 (three) times daily.   0  . Emollient (COCOA BUTTER PETROLEUM JELLY) GEL Apply topically.    . folic acid (FOLVITE) 1 MG tablet Take 1 mg by mouth daily.    . LamoTRIgine 200 MG TB24 24  hour tablet Take 1 tablet (200 mg total) by mouth 2 (two) times daily. 60 tablet 3  . linaclotide (LINZESS) 72 MCG capsule Take 1 capsule (72 mcg total) by mouth daily before breakfast. 30 capsule 2  . potassium chloride (MICRO-K) 10 MEQ CR capsule Take 10 mEq by mouth daily.    . pravastatin (PRAVACHOL) 10 MG tablet Take 10 mg by mouth at bedtime.    Marland Kitchen QUEtiapine (SEROQUEL) 25 MG tablet Take 25 mg by mouth 2 (two) times daily.   3  . valsartan-hydrochlorothiazide (DIOVAN-HCT) 320-25 MG tablet Take 1 tablet by mouth daily.    . white petrolatum ointment Apply 1 application topically 3 (three) times daily as needed for dry skin.     No current facility-administered medications on file prior to visit.     BP (!) 148/58   Pulse 63   Temp 98.1 F (36.7 C) (Oral)   Resp 16   Ht 5\' 6"  (1.676 m)   Wt 160 lb 12.8 oz (72.9 kg)   SpO2 100%   BMI 25.95 kg/m       Objective:   Physical Exam  General Appearance- Not in acute distress.    HEENT Eyes- Scleraeral/Conjuntiva-bilat- Not Yellow. Mouth & Throat- Normal. Ears- right canal is clear from any significant wax.  Normal TM.  Left canal 50-70% canal obstructed with wax.  Majority of the wax was cleared by lavage.  Chest and Lung Exam Auscultation: Breath sounds:-Normal. Adventitious sounds:- No Adventitious sounds.  Cardiovascular Auscultation:Rythm - Regular. Heart Sounds -Normal heart sounds.  Abdomen Inspection:-Inspection Normal.  Palpation/Perucssion: Palpation and Percussion of the abdomen reveal- Non Tender, No Rebound tenderness, No rigidity(Guarding) and No Palpable abdominal masses.  Liver:-Normal.  Spleen:- Normal.   Back- no cva tenderness..      Assessment & Plan:  You had some left lower quadrant region discomfort since yesterday with some mild constipation.  Your symptoms are for the most part resolved today.  However you do express desire for work-up.  We will do CBC, CMP, UA and a 1 view abdomen x-ray.   Based on your history of intermittent constipation and use of Linzess, I do think discomfort and constipation might have been the cause.  Will review labs and see if we need to add additional treatment.  For left ear cerumen impaction, we lavaged/washed out wax today.  Follow-up in 1 month or as needed.  Esperanza Richters, PA-C

## 2018-09-24 NOTE — Patient Instructions (Signed)
You had some left lower quadrant region discomfort since yesterday with some mild constipation.  Your symptoms are for the most part resolved today.  However you do express desire for work-up.  We will do CBC, CMP, UA and a 1 view abdomen x-ray.  Based on your history of intermittent constipation and use of Linzess, I do think discomfort and constipation might have been the cause.  Will review labs and see if we need to add additional treatment.  For left ear cerumen impaction, we lavaged/washed out wax today.  Follow-up in 1 month or as needed.

## 2018-10-26 ENCOUNTER — Ambulatory Visit (INDEPENDENT_AMBULATORY_CARE_PROVIDER_SITE_OTHER): Payer: Medicare HMO | Admitting: Medical

## 2018-10-26 ENCOUNTER — Encounter: Payer: Self-pay | Admitting: Medical

## 2018-10-26 VITALS — BP 130/64 | HR 58 | Temp 98.0°F | Resp 16 | Ht 66.0 in | Wt 157.2 lb

## 2018-10-26 DIAGNOSIS — I10 Essential (primary) hypertension: Secondary | ICD-10-CM | POA: Diagnosis not present

## 2018-10-26 DIAGNOSIS — Z8673 Personal history of transient ischemic attack (TIA), and cerebral infarction without residual deficits: Secondary | ICD-10-CM

## 2018-10-26 DIAGNOSIS — F0390 Unspecified dementia without behavioral disturbance: Secondary | ICD-10-CM | POA: Diagnosis not present

## 2018-10-26 DIAGNOSIS — K5900 Constipation, unspecified: Secondary | ICD-10-CM

## 2018-10-26 NOTE — Patient Instructions (Signed)
History of high blood pressure but blood pressure is well controlled today.  Continue current medications.  You has some left lower quadrant abdomen pain on last visit.  Blood work was negative and symptoms resolved quickly after I saw you.  I had advised to continue with Linzess.  You report no constipation since last seeing you.  If you have any recurrent abdomen pain or any constipation recommend going ahead and getting the x-ray which I had placed.  You had not gotten that done as requested but it is a standing order and I will not take out the order just yet.  You have history of dementia but your mental status appears good today.  Mood is also good.  I reviewed today that it appears I do not have [place] yet.  If you do not mind going by there and asking them to go ahead and send us the records.  Also if they could give you dates for PCV-13 vaccine, Tdap vaccine, and get the order DEXA.  Follow-up in 6 to 8 weeks or as needed.  Possibly sooner if she is not up-to-date on pneumonia vaccine.  We could arrange for her to have a nurse visit for that.

## 2018-10-26 NOTE — Progress Notes (Signed)
Subjective:    Patient ID: Angela Fields, female    DOB: 07-09-1951, 68 y.o.   MRN: [id number]  HPI  Pt in for follow up.  Pt has no acute complaints. States had a good weekend.  Pt had some left lower quadrant pain on last visit. Advised to continue linzess.  Pt had some constipation last visit. With her history wanted her to get xray to assess bowel pattern and amount of stool present. Pt did not get xray. States no constipation at all recently.  Our computer records states may need psv 13. But I don't have records from previous local clinic?  Also question as to weather she had dexascan.      Review of Systems  Constitutional: Negative for chills, fatigue and fever.  HENT: Negative for congestion, drooling and ear pain.   Respiratory: Negative for cough, chest tightness, shortness of breath and wheezing.   Cardiovascular: Negative for chest pain and palpitations.  Gastrointestinal: Negative for abdominal pain.  Genitourinary: Negative for difficulty urinating, dysuria, frequency, hematuria, pelvic pain and urgency.  Musculoskeletal: Negative for back pain.  Skin: Negative for rash.  Neurological: Negative for dizziness, seizures, syncope, weakness and light-headedness.       Pt has dementia. But she does remember sunny yesterday. Makes comments on weather.  Some episodes of irregular behavior. Some accuses husband of stealing things. Gets agitated at times.  Pt has appointment with [place] neurologist next week.  Hematological: Negative for adenopathy. Does not bruise/bleed easily.  Psychiatric/Behavioral: Negative for behavioral problems and confusion. The patient is not nervous/anxious.     Past Medical History:  Diagnosis Date  . Anxiety   . Dementia (HCC)   . Diabetes mellitus without complication (HCC)   . Hyperlipidemia   . Hypertension   . Seizures (HCC)   . Stroke [facility])      Social History   Socioeconomic History  . Marital status: Married    Spouse  name: Not on file  . Number of children: Not on file  . Years of education: Not on file  . Highest education level: Not on file  Occupational History  . Not on file  Social Needs  . Financial resource strain: Not on file  . Food insecurity:    Worry: Not on file    Inability: Not on file  . Transportation needs:    Medical: Not on file    Non-medical: Not on file  Tobacco Use  . Smoking status: Never Smoker  . Smokeless tobacco: Never Used  Substance and Sexual Activity  . Alcohol use: No  . Drug use: No  . Sexual activity: Not on file  Lifestyle  . Physical activity:    Days per week: Not on file    Minutes per session: Not on file  . Stress: Not on file  Relationships  . Social connections:    Talks on phone: Not on file    Gets together: Not on file    Attends religious service: Not on file    Active member of club or organization: Not on file    Attends meetings of clubs or organizations: Not on file    Relationship status: Not on file  . Intimate partner violence:    Fear of current or ex partner: Not on file    Emotionally abused: Not on file    Physically abused: Not on file    Forced sexual activity: Not on file  Other Topics Concern  . Not on file  Social History Narrative  . Not on file    No past surgical history on file.  No family history on file.  No Known Allergies  Current Outpatient Medications on File Prior to Visit  Medication Sig Dispense Refill  . acetaminophen (TYLENOL 8 HOUR ARTHRITIS PAIN) 650 MG CR tablet Take 1 tablet (650 mg total) by mouth every 8 (eight) hours as needed for pain. 30 tablet 0  . Ascorbic Acid (VITAMIN C) 1000 MG tablet Take 1,000 mg by mouth once a week.     Marland Kitchen. aspirin EC 81 MG EC tablet Take 1 tablet (81 mg total) by mouth daily. 30 tablet 0  . cholecalciferol (VITAMIN D3) 25 MCG (1000 UT) tablet Take 1,000 Units by mouth once a week.    . clonazePAM (KLONOPIN) 1 MG tablet Take 1 mg by mouth 3 (three) times daily.    0  . donepezil (ARICEPT) 10 MG tablet Take 10 mg by mouth.    . Emollient (COCOA BUTTER PETROLEUM JELLY) GEL Apply topically.    . felodipine (PLENDIL) 2.5 MG 24 hr tablet Take 1 tablet (2.5 mg total) by mouth daily. 90 tablet 0  . folic acid (FOLVITE) 1 MG tablet Take 1 mg by mouth daily.    . Lacosamide 150 MG TABS Take 1 tablet (150 mg total) by mouth 2 (two) times daily. 60 tablet 3  . LamoTRIgine 200 MG TB24 24 hour tablet Take 1 tablet (200 mg total) by mouth 2 (two) times daily. 60 tablet 3  . linaclotide (LINZESS) 72 MCG capsule Take 1 capsule (72 mcg total) by mouth daily before breakfast. 30 capsule 2  . potassium chloride (MICRO-K) 10 MEQ CR capsule Take 10 mEq by mouth daily.    . pravastatin (PRAVACHOL) 10 MG tablet Take 10 mg by mouth at bedtime.    Marland Kitchen. QUEtiapine (SEROQUEL) 25 MG tablet Take 25 mg by mouth 2 (two) times daily.   3  . white petrolatum ointment Apply 1 application topically 3 (three) times daily as needed for dry skin.     No current facility-administered medications on file prior to visit.     BP 130/64   Pulse (!) 58   Temp 98 F (36.7 C) (Oral)   Resp 16   Ht 5\' 6"  (1.676 m)   Wt 157 lb 3.2 oz (71.3 kg)   SpO2 100%   BMI 25.37 kg/m       Objective:   Physical Exam  General Mental Status- Alert. General Appearance- Not in acute distress.   Skin General: Color- Normal Color. Moisture- Normal Moisture.  Neck Carotid Arteries- Normal color. Moisture- Normal Moisture. No carotid bruits. No JVD.  Chest and Lung Exam Auscultation: Breath Sounds:-Normal.  Cardiovascular Auscultation:Rythm- Regular. Murmurs & Other Heart Sounds:Auscultation of the heart reveals- No Murmurs.  Abdomen Inspection:-Inspeection Normal. Palpation/Percussion:Note:No mass. Palpation and Percussion of the abdomen reveal- Non Tender, Non Distended + BS, no rebound or guarding.   Neurologic Cranial Nerve exam:- CN III-XII intact(No nystagmus), symmetric  smile. Strength:- 5/5 equal and symmetric strength both upper and lower extremities.     Assessment & Plan:  History of high blood pressure but blood pressure is well controlled today.  Continue current medications.  You has some left lower quadrant abdomen pain on last visit.  Blood work was negative and symptoms resolved quickly after I saw you.  I had advised to continue with Linzess.  You report no constipation since last seeing you.  If you have any recurrent  abdomen pain or any constipation recommend going ahead and getting the x-ray which I had placed.  You had not gotten that done as requested but it is a standing order and I will not take out the order just yet.  You have history of dementia but your mental status appears good today.  Mood is also good.  I reviewed today that it appears I do not have records from [place] yet.  If you do not mind going by there and asking them to go ahead and send us the records.  Also if they could give you dates for PCV-13 vaccine, Tdap vaccine, and get the order DEXA.  Follow-up in 6 to 8 weeks or as needed.  Possibly sooner if she is not up-to-date on pneumonia vaccine.  We could arrange for her to have a nurse visit for that.  Discussed at end of exam her dementia(husband brought up). Some days aggressive and agitated He states so much that he feels has to leave house to get a break.  Today is very good day. Pt has appointment with neurologist next month. Asked husband to update me if she worsens again before neurologist appointment. In that event would call and ask if she could get on cancellation list.  40 minutes spent with pt. 50% of time spent on discussing chronic problems. Extra time spent discussing dementia in light of described recent agitation.  Esperanza RichtersEdward Macarius Ruark, PA-C

## 2018-10-27 ENCOUNTER — Ambulatory Visit (INDEPENDENT_AMBULATORY_CARE_PROVIDER_SITE_OTHER): Payer: Medicare HMO

## 2018-10-27 ENCOUNTER — Telehealth: Payer: Self-pay | Admitting: *Deleted

## 2018-10-27 DIAGNOSIS — Z23 Encounter for immunization: Secondary | ICD-10-CM | POA: Diagnosis not present

## 2018-10-27 NOTE — Telephone Encounter (Signed)
Received Medical records from [facility]; forwarded to provider/SLS 01/14

## 2018-10-30 ENCOUNTER — Emergency Department (HOSPITAL_COMMUNITY)
Admission: EM | Admit: 2018-10-30 | Discharge: 2018-10-31 | Disposition: A | Discharge status: Home / Self Care | Payer: Medicare HMO | Attending: Emergency Medicine | Admitting: Emergency Medicine

## 2018-10-30 ENCOUNTER — Other Ambulatory Visit: Payer: Self-pay

## 2018-10-30 ENCOUNTER — Encounter (HOSPITAL_COMMUNITY): Payer: Self-pay | Admitting: Emergency Medicine

## 2018-10-30 DIAGNOSIS — Z7902 Long term (current) use of antithrombotics/antiplatelets: Secondary | ICD-10-CM | POA: Insufficient documentation

## 2018-10-30 DIAGNOSIS — F0151 Vascular dementia with behavioral disturbance: Secondary | ICD-10-CM | POA: Diagnosis not present

## 2018-10-30 DIAGNOSIS — Z79899 Other long term (current) drug therapy: Secondary | ICD-10-CM | POA: Insufficient documentation

## 2018-10-30 DIAGNOSIS — I10 Essential (primary) hypertension: Secondary | ICD-10-CM | POA: Diagnosis not present

## 2018-10-30 DIAGNOSIS — E119 Type 2 diabetes mellitus without complications: Secondary | ICD-10-CM | POA: Diagnosis not present

## 2018-10-30 DIAGNOSIS — Z7982 Long term (current) use of aspirin: Secondary | ICD-10-CM | POA: Diagnosis not present

## 2018-10-30 DIAGNOSIS — R531 Weakness: Secondary | ICD-10-CM | POA: Diagnosis present

## 2018-10-30 DIAGNOSIS — F0390 Unspecified dementia without behavioral disturbance: Secondary | ICD-10-CM | POA: Diagnosis not present

## 2018-10-30 DIAGNOSIS — F01518 Vascular dementia, unspecified severity, with other behavioral disturbance: Secondary | ICD-10-CM

## 2018-10-30 LAB — I-STAT CHEM 8, ED
BUN: 9 mg/dL (ref 8–23)
CHLORIDE: 105 mmol/L (ref 98–111)
CREATININE: 1 mg/dL (ref 0.44–1.00)
Calcium, Ion: 1.02 mmol/L — ABNORMAL LOW (ref 1.15–1.40)
Glucose, Bld: 87 mg/dL (ref 70–99)
HEMATOCRIT: 40 % (ref 36.0–46.0)
HEMOGLOBIN: 13.6 g/dL (ref 12.0–15.0)
POTASSIUM: 4.9 mmol/L (ref 3.5–5.1)
Sodium: 139 mmol/L (ref 135–145)
TCO2: 29 mmol/L (ref 22–32)

## 2018-10-30 NOTE — ED Notes (Signed)
Patience husband called and says he is not coming to pick up the patient.

## 2018-10-30 NOTE — ED Notes (Signed)
Called husband at [phone]

## 2018-10-30 NOTE — ED Notes (Signed)
Bed: GQ91 Expected date:  Expected time:  Means of arrival:  Comments: EMS 68 yo female from home/behavioral issue and weakness

## 2018-10-30 NOTE — ED Notes (Signed)
ED Provider at bedside. 

## 2018-10-30 NOTE — ED Notes (Signed)
Called Bettie L Billingsley  At [phone] to pick up patient. Left a message.

## 2018-10-30 NOTE — ED Notes (Signed)
Called sister and sister is trying to find someone to pick her up.

## 2018-10-30 NOTE — ED Triage Notes (Signed)
Patient arrives via EMS for "strange behavior." Patient went to a friend's apartment and asked for an ambulance. When EMS arrived, patient stated she just wanted to go home, but when they attempted to walk her home she was shakey and weak. Patient with hx of same. Hx dementia and alzheimer's.

## 2018-10-30 NOTE — ED Provider Notes (Signed)
[facility]-EMERGENCY DEPT Provider Note   CSN: [id number] Arrival date & time: 10/30/18  2044     History   Chief Complaint Chief Complaint  Patient presents with  . Weakness    HPI Angela Fields is a 68 y.o. female.  Patient brought to the emergency department because she went to her neighbor and said she needed an ambulance.  She has a history of dementia.  When she got to the hospital she states she is fine and wants to go home  The history is provided by the patient and the EMS personnel.  Altered Mental Status  Presenting symptoms: behavior changes   Severity:  Moderate Most recent episode:  Today Episode history:  Multiple Timing:  Constant Progression:  Unchanged Chronicity:  Recurrent Context: dementia   Context: not alcohol use   Associated symptoms: no abdominal pain     Past Medical History:  Diagnosis Date  . Anxiety   . Dementia (HCC)   . Diabetes mellitus without complication (HCC)   . Hyperlipidemia   . Hypertension   . Seizures (HCC)   . Stroke [facility])     Patient Active Problem List   Diagnosis Date Noted  . Hypoglycemia 07/30/2017  . Cognitive and behavioral changes 06/26/2017  . Seizure (HCC) 06/19/2017  . Status epilepticus (HCC)   . Bradycardia   . Hypokalemia   . Hypertension 09/24/2016  . History of CVA (cerebrovascular accident) 09/24/2016  . 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 Hyperlipidemia 09/24/2016    History reviewed. No pertinent surgical history.   OB History   No obstetric history on file.      Home Medications    Prior to Admission medications   Medication Sig Start Date End Date Taking? Authorizing Provider  acetaminophen (TYLENOL 8 HOUR ARTHRITIS PAIN) 650 MG CR tablet Take 1 tablet (650 mg total) by mouth every 8 (eight) hours as needed for pain. 11/07/17   Charm Rings, NP  Ascorbic Acid (VITAMIN C) 1000 MG tablet Take 1,000 mg by mouth once a week.      [provider]  aspirin EC 81 MG EC tablet Take 1 tablet (81 mg total) by mouth daily. 08/05/17   Osvaldo Shipper, MD  cholecalciferol (VITAMIN D3) 25 MCG (1000 UT) tablet Take 1,000 Units by mouth once a week.    [provider]  clonazePAM (KLONOPIN) 1 MG tablet Take 1 mg by mouth 3 (three) times daily.  05/23/17   [provider]  donepezil (ARICEPT) 10 MG tablet Take 10 mg by mouth. 09/24/18   [provider]  Emollient (COCOA BUTTER PETROLEUM JELLY) GEL Apply topically.    [provider]  felodipine (PLENDIL) 2.5 MG 24 hr tablet Take 1 tablet (2.5 mg total) by mouth daily. 09/24/18   Saguier, Ramon Dredge, PA-C  folic acid (FOLVITE) 1 MG tablet Take 1 mg by mouth daily. 03/07/18   [provider]  Lacosamide 150 MG TABS Take 1 tablet (150 mg total) by mouth 2 (two) times daily. 09/24/18   Saguier, Ramon Dredge, PA-C  LamoTRIgine 200 MG TB24 24 hour tablet Take 1 tablet (200 mg total) by mouth 2 (two) times daily. 08/25/18   Saguier, Ramon Dredge, PA-C  linaclotide [facility]) 72 MCG capsule Take 1 capsule (72 mcg total) by mouth daily before breakfast. 09/15/18   Saguier, Ramon Dredge, PA-C  potassium chloride (MICRO-K) 10 MEQ CR capsule Take 10 mEq by mouth daily. 02/12/18   [provider]  pravastatin (  PRAVACHOL) 10 MG tablet Take 10 mg by mouth at bedtime. 03/14/18   [provider]  QUEtiapine (SEROQUEL) 25 MG tablet Take 25 mg by mouth 2 (two) times daily.  02/13/18   [provider]  white petrolatum ointment Apply 1 application topically 3 (three) times daily as needed for dry skin.    [provider]    Family History No family history on file.  Social History Social History   Tobacco Use  . Smoking status: Never Smoker  . Smokeless tobacco: Never Used  Substance Use Topics  . Alcohol use: No  . Drug use: No     Allergies   Patient has no known allergies.   Review of Systems Review of Systems  Unable to  perform ROS: Dementia  Gastrointestinal: Negative for abdominal pain.     Physical Exam Updated Vital Signs BP 137/62 (BP Location: Right Arm)   Pulse (!) 55   Temp 97.7 F (36.5 C) (Oral)   Ht 5\' 6"  (1.676 m)   Wt 71.3 kg   SpO2 100%   BMI 25.37 kg/m   Physical Exam Vitals signs reviewed.  Constitutional:      Appearance: She is well-developed.  HENT:     Head: Normocephalic.     Nose: Nose normal.  Eyes:     General: No scleral icterus.    Conjunctiva/sclera: Conjunctivae normal.  Neck:     Musculoskeletal: Neck supple.     Thyroid: No thyromegaly.  Cardiovascular:     Rate and Rhythm: Normal rate and regular rhythm.     Heart sounds: No murmur. No friction rub. No gallop.   Pulmonary:     Breath sounds: No stridor. No wheezing or rales.  Chest:     Chest wall: No tenderness.  Abdominal:     General: There is no distension.     Tenderness: There is no abdominal tenderness. There is no rebound.  Musculoskeletal: Normal range of motion.  Lymphadenopathy:     Cervical: No cervical adenopathy.  Skin:    Findings: No erythema or rash.  Neurological:     Motor: No abnormal muscle tone.     Coordination: Coordination normal.     Comments: Patient oriented to person place.  Patient does not know the date      ED Treatments / Results  Labs (all labs ordered are listed, but only abnormal results are displayed) Labs Reviewed  I-STAT CHEM 8, ED - Abnormal; Notable for the following components:      Result Value   Calcium, Ion 1.02 (*)    All other components within normal limits    EKG None  Radiology No results found.  Procedures Procedures (including critical care time)  Medications Ordered in ED Medications - No data to display   Initial Impression / Assessment and Plan / ED Course  I have reviewed the triage vital signs and the nursing notes.  Pertinent labs & imaging results that were available during my care of the patient were reviewed by me  and considered in my medical decision making (see chart for details).     Patient with history of dementia.  Her dementia seems to be stable.  She will be discharged home with her husband  Final Clinical Impressions(s) / ED Diagnoses   Final diagnoses:  Vascular dementia with behavior disturbance [facility](HCC)    ED Discharge Orders    None       Bethann BerkshireZammit, Rigby Leonhardt, MD 10/30/18 2248

## 2018-10-30 NOTE — Discharge Instructions (Addendum)
Follow-up with your doctor if any problem 

## 2018-10-30 NOTE — ED Notes (Signed)
Called husband on cell [phone]. Husband is coming to pick her up.

## 2018-10-31 DIAGNOSIS — F0151 Vascular dementia with behavioral disturbance: Secondary | ICD-10-CM | POA: Diagnosis not present

## 2018-10-31 LAB — I-STAT TROPONIN, ED: Troponin i, poc: 0 ng/mL (ref 0.00–0.08)

## 2018-10-31 MED ORDER — ASPIRIN 81 MG PO CHEW
324.0000 mg | CHEWABLE_TABLET | Freq: Once | ORAL | Status: AC
  Administered 2018-10-31: 324 mg via ORAL
  Filled 2018-10-31: qty 4

## 2018-10-31 NOTE — ED Notes (Signed)
Bed: WA26 Expected date:  Expected time:  Means of arrival:  Comments: Hall D 

## 2018-10-31 NOTE — ED Notes (Signed)
Spoke with Billingsley, individual has no transportation to pick up patient but is going to make a phone call and call us back.

## 2018-10-31 NOTE — ED Notes (Signed)
Patient asleep for greater half of morning. She is up for d/c. Family friend "ronnie" called several times to speak to patient. After patient woke, she spoke with Christen Bameonnie, [facility]. Christen BameRonnie stated he was going to pick patient up. No medication orders at this time. Patient alert/pleasant/cooperative.

## 2018-10-31 NOTE — ED Provider Notes (Signed)
RN in care of patient, discharged, holding until family can pick her up reports that she is rubbing her chest, directing the RN's attention to her chest by rubbing. The patient is aphasic so difficult to assess. She certainly has risk factors for heart disease without history of same in her chart. EKG, I-stat troponin ordered to evaluate presumed chest pain. Aspirin provided, which is part of her regular medication regimen.    Elpidio Anis, PA-C 11/01/18 3536    Marily Memos, MD 11/01/18 [phone]

## 2018-10-31 NOTE — ED Notes (Signed)
Bed: WA28 Expected date:  Expected time:  Means of arrival:  Comments: 

## 2018-11-08 ENCOUNTER — Other Ambulatory Visit: Payer: Self-pay | Admitting: Medical

## 2018-11-09 ENCOUNTER — Ambulatory Visit: Payer: Self-pay | Admitting: Family

## 2018-11-09 NOTE — Telephone Encounter (Signed)
Pt requesting refill on Clonazepam provider has not prescribed medication. Please advise

## 2018-11-10 ENCOUNTER — Encounter: Payer: Self-pay | Admitting: Medical

## 2018-11-10 ENCOUNTER — Ambulatory Visit (INDEPENDENT_AMBULATORY_CARE_PROVIDER_SITE_OTHER): Payer: Medicare HMO | Admitting: Medical

## 2018-11-10 VITALS — BP 143/63 | HR 74 | Temp 98.1°F | Resp 16 | Ht 66.0 in | Wt 159.8 lb

## 2018-11-10 DIAGNOSIS — R451 Restlessness and agitation: Secondary | ICD-10-CM

## 2018-11-10 DIAGNOSIS — Z8673 Personal history of transient ischemic attack (TIA), and cerebral infarction without residual deficits: Secondary | ICD-10-CM

## 2018-11-10 DIAGNOSIS — R569 Unspecified convulsions: Secondary | ICD-10-CM

## 2018-11-10 DIAGNOSIS — F0390 Unspecified dementia without behavioral disturbance: Secondary | ICD-10-CM

## 2018-11-10 DIAGNOSIS — I10 Essential (primary) hypertension: Secondary | ICD-10-CM

## 2018-11-10 DIAGNOSIS — F419 Anxiety disorder, unspecified: Secondary | ICD-10-CM

## 2018-11-10 MED ORDER — CLONAZEPAM 0.5 MG PO TABS: 0.5000 mg | ORAL_TABLET | Freq: Three times a day (TID) | ORAL | 0 refills | Status: DC | PRN

## 2018-11-10 NOTE — Patient Instructions (Addendum)
Recent described agitated and paranoid behavior, I want you to increase Seroquel to 1 tablet 3 times daily.  For history of seizures and anxiety, I want you to continue with clonazepam 0.5mg  every 8 hours as needed.  We will give brief 1 week prescription in order to carry you over until you see neurologist on February 11.  Continue other medications the same.  Important to attend a neurologist appointment as I do want you to give neurologist update on how she did with increased Seroquel.  See if neurologist agrees with adjustment to her regimen.  Also I want to get opinion from the neurologist regarding her clonazepam dosing in light of her history of seizure and anxiety.  Follow-up approximately 10 to 14 days after neurologist appointment.  Sooner if needed.

## 2018-11-10 NOTE — Telephone Encounter (Signed)
Refilled med today when in office.

## 2018-11-10 NOTE — Progress Notes (Signed)
Subjective:    Patient ID: Angela Fields, female    DOB: [birth year], 68 y.o.   MRN: [id number]  HPI  Pt in for follow up.  Pt has some dementia. Pt has history of stroke. She has had tremors for years. Pt husband states she discusses this with former providers numerous times in the past.  Pt neurologist has explained this tremors have been normal in the past. No specific treatment given by former specialist. Tremors come and go. None today.  Yesterday her husband described that she had some paranoid frustrated behavior. Sometimes she accuses her husband on various things. She distrust her husband.  Her husband leaves at time feeling overwhelmed with her behavior. Pt misinterprets his behavior. When he leaves for a night  she thinks she is being unfaithful.  Pt has upcoming appointment with neurologist.  Pt still has clonazepam prescription. Her husband has given her 1/2 tab every 8 hours daily.    Review of Systems  Constitutional: Negative for chills, fatigue and fever.  Respiratory: Negative for cough, chest tightness, shortness of breath and wheezing.   Cardiovascular: Negative for chest pain and palpitations.  Gastrointestinal: Negative for abdominal pain.  Musculoskeletal: Negative for back pain and myalgias.  Skin: Negative for rash.  Neurological: Negative for dizziness, seizures, syncope, weakness and light-headedness.       Hx of seizures after stroke. She has min-seizure after stroke.  Hematological: Negative for adenopathy. Does not bruise/bleed easily.  Psychiatric/Behavioral: Positive for agitation, behavioral problems and decreased concentration. Negative for dysphoric mood, sleep disturbance and suicidal ideas. The patient is nervous/anxious.        Anxiety per her husband.    Past Medical History:  Diagnosis Date  . Anxiety   . Dementia (HCC)   . Diabetes mellitus without complication (HCC)   . Hyperlipidemia   . Hypertension   . Seizures (HCC)   . Stroke  [facility])      Social History   Socioeconomic History  . Marital status: Married    Spouse name: Not on file  . Number of children: Not on file  . Years of education: Not on file  . Highest education level: Not on file  Occupational History  . Not on file  Social Needs  . Financial resource strain: Not on file  . Food insecurity:    Worry: Not on file    Inability: Not on file  . Transportation needs:    Medical: Not on file    Non-medical: Not on file  Tobacco Use  . Smoking status: Never Smoker  . Smokeless tobacco: Never Used  Substance and Sexual Activity  . Alcohol use: No  . Drug use: No  . Sexual activity: Not on file  Lifestyle  . Physical activity:    Days per week: Not on file    Minutes per session: Not on file  . Stress: Not on file  Relationships  . Social connections:    Talks on phone: Not on file    Gets together: Not on file    Attends religious service: Not on file    Active member of club or organization: Not on file    Attends meetings of clubs or organizations: Not on file    Relationship status: Not on file  . Intimate partner violence:    Fear of current or ex partner: Not on file    Emotionally abused: Not on file    Physically abused: Not on file    Forced sexual  activity: Not on file  Other Topics Concern  . Not on file  Social History Narrative  . Not on file    No past surgical history on file.  No family history on file.  No Known Allergies  Current Outpatient Medications on File Prior to Visit  Medication Sig Dispense Refill  . acetaminophen (TYLENOL 8 HOUR ARTHRITIS PAIN) 650 MG CR tablet Take 1 tablet (650 mg total) by mouth every 8 (eight) hours as needed for pain. 30 tablet 0  . Ascorbic Acid (VITAMIN C) 1000 MG tablet Take 1,000 mg by mouth once a week.     Marland Kitchen. aspirin EC 81 MG EC tablet Take 1 tablet (81 mg total) by mouth daily. 30 tablet 0  . cholecalciferol (VITAMIN D3) 25 MCG (1000 UT) tablet Take 1,000 Units by mouth  once a week.    . clonazePAM (KLONOPIN) 1 MG tablet Take 1 mg by mouth 3 (three) times daily.   0  . donepezil (ARICEPT) 10 MG tablet Take 10 mg by mouth.    . Emollient (COCOA BUTTER PETROLEUM JELLY) GEL Apply topically.    . felodipine (PLENDIL) 2.5 MG 24 hr tablet Take 1 tablet (2.5 mg total) by mouth daily. 90 tablet 0  . folic acid (FOLVITE) 1 MG tablet Take 1 mg by mouth daily.    . Lacosamide 150 MG TABS Take 1 tablet (150 mg total) by mouth 2 (two) times daily. 60 tablet 3  . LamoTRIgine 200 MG TB24 24 hour tablet Take 1 tablet (200 mg total) by mouth 2 (two) times daily. 60 tablet 3  . linaclotide (LINZESS) 72 MCG capsule Take 1 capsule (72 mcg total) by mouth daily before breakfast. 30 capsule 2  . potassium chloride (MICRO-K) 10 MEQ CR capsule Take 10 mEq by mouth daily.    . pravastatin (PRAVACHOL) 10 MG tablet Take 10 mg by mouth at bedtime.    Marland Kitchen. QUEtiapine (SEROQUEL) 25 MG tablet Take 25 mg by mouth 2 (two) times daily.   3  . white petrolatum ointment Apply 1 application topically 3 (three) times daily as needed for dry skin.     No current facility-administered medications on file prior to visit.     BP (!) 143/63   Pulse 74   Temp 98.1 F (36.7 C) (Oral)   Resp 16   Ht 5\' 6"  (1.676 m)   Wt 159 lb 12.8 oz (72.5 kg)   SpO2 98%   BMI 25.79 kg/m       Objective:   Physical Exam  General Mental Status- Alert. General Appearance- Not in acute distress.   Skin General: Color- Normal Color. Moisture- Normal Moisture.  Neck Carotid Arteries- Normal color. Moisture- Normal Moisture. No carotid bruits. No JVD.  Chest and Lung Exam Auscultation: Breath Sounds:-Normal.  Cardiovascular Auscultation:Rythm- Regular. Murmurs & Other Heart Sounds:Auscultation of the heart reveals- No Murmurs.   Neurologic Cranial Nerve exam:- CN III-XII intact(No nystagmus), symmetric smile. Strength:- 5/5 equal and symmetric strength both upper and lower extremities.        Assessment & Plan:  Recent described agitated and paranoid behavior, I want you to increase Seroquel to 1 tablet 3 times daily.  For history of seizures and anxiety, I want you to continue with clonazepam 0.5mg  every 8 hours as needed.  We will give brief 1 week prescription in order to carry you over until you see neurologist on February 11.  Continue other medications the same.  Important to attend a neurologist  appointment as I do want you to give neurologist update on how she did with increased Seroquel.  See if neurologist agrees with adjustment to her regimen.  Also I want to get opinion from the neurologist regarding her clonazepam dosing in light of her history of seizure and anxiety.  Follow-up approximately 10 to 14 days after neurologist appointment.  Sooner if needed.  40 minutes spent with pt and husband. 50% of time spent with both discussing there relationship issues in light of her dementia. Counseled on ways to adapt and attempted to try to understand patients and husband concerns.

## 2018-11-23 ENCOUNTER — Other Ambulatory Visit: Payer: Self-pay

## 2018-11-23 ENCOUNTER — Encounter (HOSPITAL_BASED_OUTPATIENT_CLINIC_OR_DEPARTMENT_OTHER): Payer: Self-pay | Admitting: *Deleted

## 2018-11-23 ENCOUNTER — Other Ambulatory Visit: Payer: Self-pay | Admitting: Medical

## 2018-11-23 DIAGNOSIS — I10 Essential (primary) hypertension: Secondary | ICD-10-CM | POA: Diagnosis not present

## 2018-11-23 DIAGNOSIS — R251 Tremor, unspecified: Secondary | ICD-10-CM | POA: Diagnosis not present

## 2018-11-23 DIAGNOSIS — Z7982 Long term (current) use of aspirin: Secondary | ICD-10-CM | POA: Insufficient documentation

## 2018-11-23 DIAGNOSIS — Z79899 Other long term (current) drug therapy: Secondary | ICD-10-CM | POA: Diagnosis not present

## 2018-11-23 DIAGNOSIS — E119 Type 2 diabetes mellitus without complications: Secondary | ICD-10-CM | POA: Diagnosis not present

## 2018-11-23 NOTE — ED Triage Notes (Signed)
She ran out of her clonazepam today. She has been shaky per husband. She is alert.

## 2018-11-23 NOTE — Telephone Encounter (Signed)
Copied from CRM [id number]. Topic: Quick Communication - Rx Refill/Question >> Nov 23, 2018  1:17 PM Percival Spanish wrote: Medication  clonazePAM (KLONOPIN) 0.5 MG tablet  Has the patient contacted their pharmacy yes  Preferred Pharmacy  [facility]   Agent: Please be advised that RX refills may take up to 3 business days. We ask that you follow-up with your pharmacy.

## 2018-11-24 ENCOUNTER — Ambulatory Visit (INDEPENDENT_AMBULATORY_CARE_PROVIDER_SITE_OTHER): Payer: Medicare HMO | Admitting: Neurology

## 2018-11-24 ENCOUNTER — Emergency Department (HOSPITAL_BASED_OUTPATIENT_CLINIC_OR_DEPARTMENT_OTHER)
Admission: EM | Admit: 2018-11-24 | Discharge: 2018-11-24 | Disposition: A | Discharge status: Home / Self Care | Payer: Medicare HMO | Attending: Emergency Medicine | Admitting: Emergency Medicine

## 2018-11-24 ENCOUNTER — Encounter: Payer: Self-pay | Admitting: Neurology

## 2018-11-24 VITALS — BP 142/80 | HR 72 | Ht 66.0 in | Wt 163.0 lb

## 2018-11-24 DIAGNOSIS — F0391 Unspecified dementia with behavioral disturbance: Secondary | ICD-10-CM | POA: Diagnosis not present

## 2018-11-24 DIAGNOSIS — R569 Unspecified convulsions: Secondary | ICD-10-CM

## 2018-11-24 DIAGNOSIS — R251 Tremor, unspecified: Secondary | ICD-10-CM | POA: Diagnosis not present

## 2018-11-24 DIAGNOSIS — F03918 Unspecified dementia, unspecified severity, with other behavioral disturbance: Secondary | ICD-10-CM | POA: Insufficient documentation

## 2018-11-24 MED ORDER — LAMOTRIGINE ER 200 MG PO TB24: 400.0000 mg | ORAL_TABLET | Freq: Every day | ORAL | 4 refills | Status: DC

## 2018-11-24 MED ORDER — LORAZEPAM 1 MG PO TABS
0.5000 mg | ORAL_TABLET | Freq: Once | ORAL | Status: AC
  Administered 2018-11-24: 0.5 mg via ORAL
  Filled 2018-11-24: qty 1

## 2018-11-24 MED ORDER — CLONAZEPAM 0.5 MG PO TABS
0.5000 mg | ORAL_TABLET | Freq: Once | ORAL | Status: DC
  Filled 2018-11-24: qty 1

## 2018-11-24 NOTE — ED Notes (Signed)
Patient is A&Ox4.  No signs of distress noted.  Please see providers complete history and physical exam.  

## 2018-11-24 NOTE — ED Notes (Signed)
PT states understanding of care given, follow up care, and medication prescribed. PT ambulated from ED to car with a steady gait. 

## 2018-11-24 NOTE — Progress Notes (Signed)
PATIENT: Angela Fields DOB: Nov 03, 1950  Chief Complaint  Patient presents with  . Hx of seizures    She is here with her husband, Angela Fields.  She is currently taking Lamictal 200mg  BID and Vimpat 150mg  BID.  No recent seizure activity.   . Anxiety    She uses clonazepam 0.5mg  TID.  Marland Kitchen. Dementia    Reports worsening memory with intermittent episodes of agitation.  She using donepezil 10mg  qd and seroquel 25mg  BID for her symptoms.  She has such delayed recall and a loss for words that she is unable to complete the MMSE.  It is causing her frustration.   Marland Kitchen. PCP    Saguier, Kateri McEdward, PA-C (previously saw neurology - Dr. Windle GuardHaworth who has retired)     HISTORICAL  Angela DohenyCarolyn Fields is a 68 years old female, seen in request by her primary care PA Saguier, Ramon Dredgedward for evaluation of seizure, dementia, initial evaluation was on November 24, 2018.  She is accompanied by her husband at today's clinical visit, she was previously a patient of Dr. Windle GuardHaworth who has retired,  I have reviewed and summarized the referring note from the referring physician.  I was able to review her most recent evaluation with Dr. Windle GuardHaworth on September 24, 2018.  She lives with her husband of 45 years, they have no children, [place].  She retired as a Theme park managerschool secretary at age 68, then was actively involved in [employer], in 2010, while they were visiting [place], she suffered hemorrhagic stroke, with confusion, mild right-sided weakness,  She had a significant change since that event, she had gradual worsening memory loss, now she lives at home with her husband, has delusional idea, falsely accused her husband from hiding their money, involved into relationship, get agitated easily, she has no significant physical difficulty, enjoys walking, still able to dress, bathing, using toilet without assistance, but she has significant memory loss, was not cooperative on today's Mini-Mental Status Examination,  I reviewed report  MRI of the brain in April 2018, remote infarction in the left occipital lobe, to mid temporal lobe, with enlargement of left lateral ventricle, in the distribution of left PCA, extensive small vessel disease, fluid devoid of right carotid artery, MRA of the brain, intracranial atherosclerotic disease, but no significant large vessel disease.  She also had a history of seizures and stroke in 2010, a total of 20 seizures over past 10 years, most recent was in January 2019, has been on lamotrigine ER 200 twice a day, Vimpat 150 mg twice a day, her husband contributed her recurrent seizure due to missing her medications, sometimes withdrawal from clonazepam,  REVIEW OF SYSTEMS: Full 14 system review of systems performed and notable only for weight loss, blurred vision, double vision, snoring, urination problems, feeling cold, memory loss, confusion, seizure, snoring, anxiety, change in appetite, suicidal thoughts. All other review of systems were negative.  ALLERGIES: No Known Allergies  HOME MEDICATIONS: Current Outpatient Medications  Medication Sig Dispense Refill  . acetaminophen (TYLENOL 8 HOUR ARTHRITIS PAIN) 650 MG CR tablet Take 1 tablet (650 mg total) by mouth every 8 (eight) hours as needed for pain. 30 tablet 0  . Ascorbic Acid (VITAMIN C) 1000 MG tablet Take 1,000 mg by mouth once a week.     Marland Kitchen. aspirin EC 81 MG EC tablet Take 1 tablet (81 mg total) by mouth daily. 30 tablet 0  . cholecalciferol (VITAMIN D3) 25 MCG (1000 UT) tablet Take 1,000 Units by  mouth once a week.    . clonazePAM (KLONOPIN) 0.5 MG tablet Take 1 tablet (0.5 mg total) by mouth 3 (three) times daily as needed for anxiety. 21 tablet 0  . donepezil (ARICEPT) 10 MG tablet Take 10 mg by mouth.    . Emollient (COCOA BUTTER PETROLEUM JELLY) GEL Apply topically.    . felodipine (PLENDIL) 2.5 MG 24 hr tablet Take 1 tablet (2.5 mg total) by mouth daily. 90 tablet 0  . folic acid (FOLVITE) 1 MG tablet Take 1 mg by mouth daily.     . Lacosamide 150 MG TABS Take 1 tablet (150 mg total) by mouth 2 (two) times daily. 60 tablet 3  . LamoTRIgine 200 MG TB24 24 hour tablet Take 1 tablet (200 mg total) by mouth 2 (two) times daily. 60 tablet 3  . linaclotide (LINZESS) 72 MCG capsule Take 1 capsule (72 mcg total) by mouth daily before breakfast. 30 capsule 2  . potassium chloride (MICRO-K) 10 MEQ CR capsule Take 10 mEq by mouth daily.    . pravastatin (PRAVACHOL) 10 MG tablet Take 10 mg by mouth at bedtime.    Marland Kitchen QUEtiapine (SEROQUEL) 25 MG tablet Take 25 mg by mouth 2 (two) times daily.   3  . white petrolatum ointment Apply 1 application topically 3 (three) times daily as needed for dry skin.     No current facility-administered medications for this visit.     PAST MEDICAL HISTORY: Past Medical History:  Diagnosis Date  . Anxiety   . Dementia (HCC)   . Diabetes mellitus without complication (HCC)   . Hyperlipidemia   . Hypertension   . Mood disorder (HCC)   . Rheumatic fever   . Seizures (HCC)   . Sleep apnea   . Stroke [facility])     PAST SURGICAL HISTORY: History reviewed. No pertinent surgical history.  FAMILY HISTORY: Family History  Problem Relation Age of Onset  . Other Mother        "blood infection"  . Heart attack Father     SOCIAL HISTORY: Social History   Socioeconomic History  . Marital status: Married    Spouse name: Not on file  . Number of children: 0  . Years of education: some college  . Highest education level: Not on file  Occupational History  . Occupation: Retired  Engineer, production  . Financial resource strain: Not on file  . Food insecurity:    Worry: Not on file    Inability: Not on file  . Transportation needs:    Medical: Not on file    Non-medical: Not on file  Tobacco Use  . Smoking status: Former Games developer  . Smokeless tobacco: Never Used  . Tobacco comment: quit in 1970's  Substance and Sexual Activity  . Alcohol use: No    Comment: none since 2010 - then only one drink  per week  . Drug use: No  . Sexual activity: Not on file  Lifestyle  . Physical activity:    Days per week: Not on file    Minutes per session: Not on file  . Stress: Not on file  Relationships  . Social connections:    Talks on phone: Not on file    Gets together: Not on file    Attends religious service: Not on file    Active member of club or organization: Not on file    Attends meetings of clubs or organizations: Not on file    Relationship status: Not on  file  . Intimate partner violence:    Fear of current or ex partner: Not on file    Emotionally abused: Not on file    Physically abused: Not on file    Forced sexual activity: Not on file  Other Topics Concern  . Not on file  Social History Narrative   Lives at home with husband.   Right-handed.   No caffeine use.     PHYSICAL EXAM   Vitals:   11/24/18 1124  BP: (!) 142/80  Pulse: 72  Weight: 163 lb (73.9 kg)  Height: 5\' 6"  (1.676 m)    Not recorded      Body mass index is 26.31 kg/m.  PHYSICAL EXAMNIATION:  Gen: NAD, conversant, well nourised, obese, well groomed                     Cardiovascular: Regular rate rhythm, no peripheral edema, warm, nontender. Eyes: Conjunctivae clear without exudates or hemorrhage Neck: Supple, no carotid bruits. Pulmonary: Clear to auscultation bilaterally   NEUROLOGICAL EXAM:  MENTAL STATUS: Speech/cognition: Awake, following commands, paraphasic errors,  CRANIAL NERVES: CN II: Right visual field deficit on threat, pupils are round equal and briskly reactive to light. CN III, IV, VI: extraocular movement are normal. No ptosis. CN V: Facial sensation is intact to pinprick in all 3 divisions bilaterally. Corneal responses are intact.  CN VII: Face is symmetric with normal eye closure and smile. CN VIII: Hearing is normal to rubbing fingers CN IX, X: Palate elevates symmetrically. Phonation is normal. CN XI: Head turning and shoulder shrug are intact CN XII:  Tongue is midline with normal movements and no atrophy.  MOTOR: There is no pronator drift of out-stretched arms. Muscle bulk and tone are normal. Muscle strength is normal.  REFLEXES: Reflexes are 2+ and symmetric at the biceps, triceps, knees, and ankles. Plantar responses are flexor.  SENSORY: Intact to light touch, pinprick, positional sensation and vibratory sensation are intact in fingers and toes.  COORDINATION: Rapid alternating movements and fine finger movements are intact. There is no dysmetria on finger-to-nose and heel-knee-shin.    GAIT/STANCE: Posture is normal. Gait is steady with normal steps, base, arm swing, and turning. Heel and toe walking are normal. Tandem gait is normal.  Romberg is absent.   DIAGNOSTIC DATA (LABS, IMAGING, TESTING) - I reviewed patient records, labs, notes, testing and imaging myself where available.   ASSESSMENT AND PLAN  Angela DohenyCarolyn Lomas is a 68 y.o. female   History of hemorrhagic stroke Seizure Dementia with agitation  Will try to simplify her medications, taper off Vimpat  Change lamotrigine to ER 200 mg 2 tablets every night  I have discussed with her husband, he desires complete evaluation, proceed with MRI of the brain, EEG,     Levert FeinsteinYijun Kaivon Livesey, M.D. Ph.D.  [facility] [address], [address] [place], [place] [address] Ph: [phone] Fax: [phone]  [id number]:Saguier, Ramon Dredgedward, [place]-C

## 2018-11-24 NOTE — ED Provider Notes (Signed)
[facility] EMERGENCY DEPARTMENT Provider Note   CSN: [id number] Arrival date & time: 11/23/18  2204     History   Chief Complaint Chief Complaint  Patient presents with  . Shaky   Level 5 caveat due to dementia HPI Angela Fields is a 68 y.o. female.  The history is provided by the patient and the spouse.   Patient presents with shakiness.  Husband reports that several hours ago she had an episode of "shakiness "and also was very tearful.  She did not lose consciousness.  She has had these episodes before.  She recently ran out of her Klonopin, which has helped these episodes.  No fevers or vomiting.  Husband reports she has been off balance recently.  Husband is also noted that her blood pressures have been more elevated.  She has been managed by her PCP, and has a follow-up with neurology on February 11 Past Medical History:  Diagnosis Date  . Anxiety   . Dementia (HCC)   . Diabetes mellitus without complication (HCC)   . Hyperlipidemia   . Hypertension   . Seizures (HCC)   . Stroke [facility])     Patient Active Problem List   Diagnosis Date Noted  . Hypoglycemia 07/30/2017  . Cognitive and behavioral changes 06/26/2017  . Seizure (HCC) 06/19/2017  . Status epilepticus (HCC)   . Bradycardia   . Hypokalemia   . Hypertension 09/24/2016  . History of CVA (cerebrovascular accident) 09/24/2016  . 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 Hyperlipidemia 09/24/2016    History reviewed. No pertinent surgical history.   OB History   No obstetric history on file.      Home Medications    Prior to Admission medications   Medication Sig Start Date End Date Taking? Authorizing Provider  acetaminophen (TYLENOL 8 HOUR ARTHRITIS PAIN) 650 MG CR tablet Take 1 tablet (650 mg total) by mouth every 8 (eight) hours as needed for pain. 11/07/17   Charm Rings, NP  Ascorbic Acid (VITAMIN C) 1000 MG tablet Take 1,000 mg by mouth once a week.      [provider]  aspirin EC 81 MG EC tablet Take 1 tablet (81 mg total) by mouth daily. 08/05/17   Osvaldo Shipper, MD  cholecalciferol (VITAMIN D3) 25 MCG (1000 UT) tablet Take 1,000 Units by mouth once a week.    [provider]  clonazePAM (KLONOPIN) 0.5 MG tablet Take 1 tablet (0.5 mg total) by mouth 3 (three) times daily as needed for anxiety. 11/10/18   Saguier, Ramon Dredge, PA-C  clonazePAM (KLONOPIN) 1 MG tablet Take 1 mg by mouth 3 (three) times daily.  05/23/17   [provider]  donepezil (ARICEPT) 10 MG tablet Take 10 mg by mouth. 09/24/18   [provider]  Emollient (COCOA BUTTER PETROLEUM JELLY) GEL Apply topically.    [provider]  felodipine (PLENDIL) 2.5 MG 24 hr tablet Take 1 tablet (2.5 mg total) by mouth daily. 09/24/18   Saguier, Ramon Dredge, PA-C  folic acid (FOLVITE) 1 MG tablet Take 1 mg by mouth daily. 03/07/18   [provider]  Lacosamide 150 MG TABS Take 1 tablet (150 mg total) by mouth 2 (two) times daily. 09/24/18   Saguier, Ramon Dredge, PA-C  LamoTRIgine 200 MG TB24 24 hour tablet Take 1 tablet (200 mg total) by mouth 2 (two) times daily. 08/25/18   Saguier, Ramon Dredge, PA-C  linaclotide (LINZESS) 72 MCG capsule Take 1 capsule (72 mcg total) by  mouth daily before breakfast. 09/15/18   Saguier, Ramon Dredge, PA-C  potassium chloride (MICRO-K) 10 MEQ CR capsule Take 10 mEq by mouth daily. 02/12/18   [provider]  pravastatin (PRAVACHOL) 10 MG tablet Take 10 mg by mouth at bedtime. 03/14/18   [provider]  QUEtiapine (SEROQUEL) 25 MG tablet Take 25 mg by mouth 2 (two) times daily.  02/13/18   [provider]  white petrolatum ointment Apply 1 application topically 3 (three) times daily as needed for dry skin.    [provider]    Family History No family history on file.  Social History Social History   Tobacco Use  . Smoking status: Never Smoker  . Smokeless tobacco: Never Used  Substance Use  Topics  . Alcohol use: No  . Drug use: No     Allergies   Patient has no known allergies.   Review of Systems Review of Systems  Unable to perform ROS: Dementia     Physical Exam Updated Vital Signs BP (!) 148/68   Pulse (!) 51   Temp 98 F (36.7 C) (Oral)   Resp 16   Ht 1.676 m (5\' 6" )   Wt 72.4 kg   SpO2 99%   BMI 25.76 kg/m   Physical Exam CONSTITUTIONAL: Elderly, smiling, no acute distress HEAD: Normocephalic/atraumatic EYES: EOMI ENMT: Mucous membranes moist NECK: supple no meningeal signs SPINE/BACK:entire spine nontender CV: S1/S2 noted, no murmurs/rubs/gallops noted LUNGS: Lungs are clear to auscultation bilaterally, no apparent distress ABDOMEN: soft, nontender NEURO: Pt is awake/alert, moves all extremitiesx4.  No facial droop.  She is pleasantly confused.  No focal motor deficits.  She is smiling, watching TV and eating a sandwich EXTREMITIES: pulses normal/equal, full ROM SKIN: warm, color normal  ED Treatments / Results  Labs (all labs ordered are listed, but only abnormal results are displayed) Labs Reviewed - No data to display  EKG None  Radiology No results found.  Procedures Procedures   Medications Ordered in ED Medications  LORazepam (ATIVAN) tablet 0.5 mg (0.5 mg Oral Given 11/24/18 0213)     Initial Impression / Assessment and Plan / ED Course  I have reviewed the triage vital signs and the nursing notes.      Patient with long history of episodes of shakiness and possible seizures.  husand reports it has been well controlled until she ran out of Klonopin.  He describes an episode earlier but she did not have loss of consciousness.  She had had some difficulty walking and unsteadiness, but she ambulated in the ER with nursing staff. She is awake alert, no acute distress.  She is smiling and eating a sandwich. She has follow-up with neurology in approximately 8 hours.  Small dose of Ativan given here, but will defer any new  medications to neurology. Patient/husband agreeable plan  Final Clinical Impressions(s) / ED Diagnoses   Final diagnoses:  Shakiness    ED Discharge Orders    None       Zadie Rhine, MD 11/24/18 [id number]

## 2018-11-25 ENCOUNTER — Telehealth: Payer: Self-pay | Admitting: Neurology

## 2018-11-25 MED ORDER — CLONAZEPAM 0.5 MG PO TABS: 0.5000 mg | ORAL_TABLET | Freq: Three times a day (TID) | ORAL | 0 refills | Status: DC | PRN

## 2018-11-25 NOTE — Telephone Encounter (Signed)
I did see patient was emergency department for mild shaking the other day.  This was after running out of clonazepam.  Same day patient was seen by neurologist who made mention of her seizure history and gave advice regarding that.  No comments regarding her need to stop clonazepam.  She has been on this for years and was given this by prior providers.  Patient has dementia and therefore cannot sign controlled medication agreement.  I did go ahead and send in limited 30 tablets supply with a.

## 2018-11-25 NOTE — Telephone Encounter (Signed)
Medicare/bcbs order sent to GI. They will reach out to the pt to schedule.  °

## 2018-11-25 NOTE — Telephone Encounter (Signed)
[employer] medicare/bcbs

## 2018-11-26 ENCOUNTER — Telehealth: Payer: Self-pay | Admitting: Neurology

## 2018-11-26 ENCOUNTER — Telehealth: Payer: Self-pay | Admitting: Medical

## 2018-11-26 MED ORDER — PRAVASTATIN SODIUM 10 MG PO TABS: 10.0000 mg | ORAL_TABLET | Freq: Every day | ORAL | 1 refills | Status: DC

## 2018-11-26 MED ORDER — POTASSIUM CHLORIDE ER 10 MEQ PO CPCR: 10.0000 meq | ORAL_CAPSULE | Freq: Every day | ORAL | 1 refills | Status: DC

## 2018-11-26 NOTE — Telephone Encounter (Signed)
Return call to patient's husband, and left detailed message (ok per DPR), with the following information:  Office visit 11/24/2018 ASSESSMENT AND PLAN  Angela Fields is a 68 y.o. female   History of hemorrhagic stroke Seizure Dementia with agitation             Will try to simplify her medications, taper off Vimpat             Change lamotrigine to ER 200 mg 2 tablets every night             I have discussed with her husband, he desires complete evaluation, proceed with MRI of the brain, EEG.            Levert Feinstein, M.D. Ph.D.  I provided our number to call back for any other questions.

## 2018-11-26 NOTE — Telephone Encounter (Signed)
Pt's husband called is wanting to know what was to be sent to pharmacy in place of vimpat. Please call to advise

## 2018-11-26 NOTE — Telephone Encounter (Signed)
Rx sent to pharmacy   

## 2018-11-26 NOTE — Telephone Encounter (Signed)
Pt's spouse came in office with pt requesting needing rx sent to pharmacy for potassium chloride (MICRO-K) 10 MEQ CR capsule and also needing rx pravastatin (PRAVACHOL) 10 MG tablet [facility] [address] [facility]. Please advise. Pt tel [phone].

## 2018-11-27 ENCOUNTER — Ambulatory Visit: Payer: Self-pay | Admitting: Medical

## 2018-11-30 ENCOUNTER — Telehealth: Payer: Self-pay | Admitting: Medical

## 2018-11-30 ENCOUNTER — Telehealth: Payer: Self-pay | Admitting: Neurology

## 2018-11-30 NOTE — Telephone Encounter (Addendum)
I have spoken with her husband.  I have reviewed the information below with him, in detail.  1)  He is agreeable to discuss clonazepam refills with her PCP.  2) He understands to give the lamotrigine 200mg , TB24hr, two tablets at bedtime.  He understands that the tablet releases the medication over 24 hours and to give her the medication at the same time each night.  3) States that she sleeps through the night with quetiapine 25mg  at bedtime.  He understands to simply skip the daytime dose, if she does not need it and only continue the 25mg  at bedtime.  I provided our number again to him and encouraged him to call us, if he has any further questions.

## 2018-11-30 NOTE — Telephone Encounter (Signed)
Pt's Husband called stating pt is needing a refill on her clonazePAM (KLONOPIN) 0.5 MG tablet [facility] [address]. He also states that she was told that her LamoTRIgine 200 MG TB24 24 hour tablet was going to be changed to the ER version of the medication but she is already on the ER so they are a bit confused on what the change will be on the medication.  Also her QUEtiapine (SEROQUEL) 25 MG tablet states it's twice a day but the husband would like to know if she can take both a bedtime. Please advise.

## 2018-11-30 NOTE — Telephone Encounter (Addendum)
Per vo by Dr. Terrace Arabia, provide instructions for the following:  1) Clonazepam is being managed by her PCP for anxiety.  This rx should continue to be prescribed by PCP.  2) The prescribed lamotrigine 200mg  TB24hr tablets are slow release over 24 hours. She was taking one tablet twice daily but Dr. Terrace Arabia instructed her to take both tablets at bedtime, in effort to simplify her medication schedule.  She will still keep a steady dose of medication in her system.  3) She has been taking quetiapine 25mg , one tablet BID.  Her husband feels the daytime dose makes her too drowsy during the time she should be awake.  It is okay to just give her 25mg  at bedtime and skip the morning dose, if she does not need it for agitation.  If she is not sleeping well at night, he can give her the two tablets at bedtime, it needed.  However, the least amount of this medication used is better for the patient.    I have returned the call to the Mr. Aebi and left a message requesting a call back.

## 2018-12-01 NOTE — Telephone Encounter (Signed)
Refill Request: clonazepam  Last RX:11/25/18 Last OV:11/10/18 Next OV:12/08/18 HAL:PFXT on file  CSC: None on file CSR:

## 2018-12-01 NOTE — Telephone Encounter (Signed)
I refilled pt clonazepam the other day/less than 10 days ago. Did they pick up that rx. She is not due for refill yet?Marland Kitchen Ask husband how many tablets does pt use in a month time?

## 2018-12-02 NOTE — Telephone Encounter (Signed)
Pt's husband stated she uses 3 tablets per day. Rx was not picked up yet because he had not heard from the pharmacy that the rx was ready.

## 2018-12-02 NOTE — Telephone Encounter (Signed)
Left pt a message to call back. Okay for PEC to give information.  

## 2018-12-04 ENCOUNTER — Other Ambulatory Visit: Payer: Self-pay | Admitting: Medical

## 2018-12-04 ENCOUNTER — Ambulatory Visit: Payer: Self-pay | Admitting: *Deleted

## 2018-12-04 NOTE — Telephone Encounter (Signed)
Summary: Medication Management   Pt's husband called and stated there are some discrepancies with medications that his wife is taking. He would like to be able to go over the list of medications. Please advise. CB#[phone]     Patient's husband would like to come in and go over medication list- call to office to see if that would be a nurse visit or PCP visit. Windell Moulding took call.  Reason for Disposition . Pharmacy calling with prescription questions and triager unable to answer question  Protocols used: MEDICATION QUESTION CALL-A-AH

## 2018-12-06 ENCOUNTER — Ambulatory Visit
Admission: RE | Admit: 2018-12-06 | Discharge: 2018-12-06 | Disposition: A | Discharge status: Home / Self Care | Payer: Medicare HMO | Source: Ambulatory Visit | Attending: Neurology | Admitting: Neurology

## 2018-12-06 DIAGNOSIS — R569 Unspecified convulsions: Secondary | ICD-10-CM | POA: Diagnosis not present

## 2018-12-06 DIAGNOSIS — F0391 Unspecified dementia with behavioral disturbance: Secondary | ICD-10-CM

## 2018-12-07 ENCOUNTER — Encounter (HOSPITAL_BASED_OUTPATIENT_CLINIC_OR_DEPARTMENT_OTHER): Payer: Self-pay | Admitting: Emergency Medicine

## 2018-12-07 ENCOUNTER — Emergency Department (HOSPITAL_BASED_OUTPATIENT_CLINIC_OR_DEPARTMENT_OTHER)
Admission: EM | Admit: 2018-12-07 | Discharge: 2018-12-08 | Disposition: A | Discharge status: Home / Self Care | Payer: Medicare HMO | Attending: Emergency Medicine | Admitting: Emergency Medicine

## 2018-12-07 ENCOUNTER — Other Ambulatory Visit: Payer: Self-pay

## 2018-12-07 DIAGNOSIS — Z8673 Personal history of transient ischemic attack (TIA), and cerebral infarction without residual deficits: Secondary | ICD-10-CM | POA: Insufficient documentation

## 2018-12-07 DIAGNOSIS — E119 Type 2 diabetes mellitus without complications: Secondary | ICD-10-CM | POA: Insufficient documentation

## 2018-12-07 DIAGNOSIS — Z87891 Personal history of nicotine dependence: Secondary | ICD-10-CM | POA: Diagnosis not present

## 2018-12-07 DIAGNOSIS — I10 Essential (primary) hypertension: Secondary | ICD-10-CM | POA: Insufficient documentation

## 2018-12-07 DIAGNOSIS — Z79899 Other long term (current) drug therapy: Secondary | ICD-10-CM | POA: Insufficient documentation

## 2018-12-07 DIAGNOSIS — F0390 Unspecified dementia without behavioral disturbance: Secondary | ICD-10-CM | POA: Diagnosis not present

## 2018-12-07 DIAGNOSIS — Z760 Encounter for issue of repeat prescription: Secondary | ICD-10-CM | POA: Diagnosis present

## 2018-12-07 NOTE — ED Triage Notes (Signed)
Pt has dementia  Pt is upset about her medication  Husband states he sets up her medications weekly  Pt has been out of her clonazepam for the past 4 days  Husband states she has been very anxious and animated in her behavior

## 2018-12-07 NOTE — Telephone Encounter (Signed)
[employer] medicare Angela Fields: [id number] (exp. 12/03/18 to 03/03/19) patient had MRI at GI on 12/06/18

## 2018-12-08 ENCOUNTER — Ambulatory Visit (INDEPENDENT_AMBULATORY_CARE_PROVIDER_SITE_OTHER): Payer: Medicare HMO | Admitting: Medical

## 2018-12-08 ENCOUNTER — Encounter: Payer: Self-pay | Admitting: Medical

## 2018-12-08 ENCOUNTER — Telehealth: Payer: Self-pay | Admitting: Neurology

## 2018-12-08 VITALS — BP 154/71 | HR 63 | Temp 98.4°F | Resp 16 | Ht 66.0 in | Wt 163.4 lb

## 2018-12-08 DIAGNOSIS — F0390 Unspecified dementia without behavioral disturbance: Secondary | ICD-10-CM

## 2018-12-08 DIAGNOSIS — I10 Essential (primary) hypertension: Secondary | ICD-10-CM | POA: Diagnosis not present

## 2018-12-08 DIAGNOSIS — F419 Anxiety disorder, unspecified: Secondary | ICD-10-CM | POA: Diagnosis not present

## 2018-12-08 DIAGNOSIS — G40909 Epilepsy, unspecified, not intractable, without status epilepticus: Secondary | ICD-10-CM | POA: Diagnosis not present

## 2018-12-08 DIAGNOSIS — Z760 Encounter for issue of repeat prescription: Secondary | ICD-10-CM | POA: Diagnosis not present

## 2018-12-08 MED ORDER — LORAZEPAM 1 MG PO TABS
0.5000 mg | ORAL_TABLET | Freq: Once | ORAL | Status: AC
  Administered 2018-12-08: 0.5 mg via ORAL
  Filled 2018-12-08: qty 1

## 2018-12-08 MED ORDER — CLONAZEPAM 0.5 MG PO TABS: 0.5000 mg | ORAL_TABLET | Freq: Two times a day (BID) | ORAL | 0 refills | Status: DC | PRN

## 2018-12-08 MED ORDER — FELODIPINE ER 5 MG PO TB24: 5.0000 mg | ORAL_TABLET | Freq: Every day | ORAL | 0 refills | Status: DC

## 2018-12-08 NOTE — Telephone Encounter (Signed)
Spoke to her husband, Haidyn Copley (on [place]).  He is aware of her MRI results and verbalized understanding.

## 2018-12-08 NOTE — Progress Notes (Signed)
Subjective:    Patient ID: Angela Fields, female    DOB: 1951/05/08, 68 y.o.   MRN: [id number]  HPI   Pt has used clonazepam historically for anxiety. I gave refill of this. Pt was on this for years even before she saw me. I had written limited supply and had asked husband just to give her as needed but not necessarily every 8 hours as in her age group I think it is excessive. She gets anxious when husband leaves the house.  In past with Toma Copier she would use 90 tabs in one month.  Pt had also recently been placed on lamictal. This was prescribed by neurologist. She is not wanting to take at night. Pt husband states she was taking it twice a day and spread out. Now they will follow neurologist advise and she will take it at night.     Review of Systems  Constitutional: Negative for chills, fatigue and fever.  HENT: Negative for congestion and drooling.   Respiratory: Negative for cough, chest tightness, shortness of breath and wheezing.   Cardiovascular: Negative for chest pain and palpitations.  Gastrointestinal: Negative for abdominal pain.  Musculoskeletal: Negative for back pain.  Skin: Negative for rash.  Neurological: Negative for dizziness, syncope, weakness, numbness and headaches.  Psychiatric/Behavioral: The patient is nervous/anxious.     Past Medical History:  Diagnosis Date  . Anxiety   . Dementia (HCC)   . Diabetes mellitus without complication (HCC)   . Hyperlipidemia   . Hypertension   . Mood disorder (HCC)   . Rheumatic fever   . Seizures (HCC)   . Sleep apnea   . Stroke [facility])      Social History   Socioeconomic History  . Marital status: Married    Spouse name: Not on file  . Number of children: 0  . Years of education: some college  . Highest education level: Not on file  Occupational History  . Occupation: Retired  Engineer, production  . Financial resource strain: Not on file  . Food insecurity:    Worry: Not on file    Inability: Not on file  .  Transportation needs:    Medical: Not on file    Non-medical: Not on file  Tobacco Use  . Smoking status: Former Games developer  . Smokeless tobacco: Never Used  . Tobacco comment: quit in 1970's  Substance and Sexual Activity  . Alcohol use: No    Comment: none since 2010 - then only one drink per week  . Drug use: No  . Sexual activity: Not on file  Lifestyle  . Physical activity:    Days per week: Not on file    Minutes per session: Not on file  . Stress: Not on file  Relationships  . Social connections:    Talks on phone: Not on file    Gets together: Not on file    Attends religious service: Not on file    Active member of club or organization: Not on file    Attends meetings of clubs or organizations: Not on file    Relationship status: Not on file  . Intimate partner violence:    Fear of current or ex partner: Not on file    Emotionally abused: Not on file    Physically abused: Not on file    Forced sexual activity: Not on file  Other Topics Concern  . Not on file  Social History Narrative   Lives at home with husband.  Right-handed.   No caffeine use.    No past surgical history on file.  Family History  Problem Relation Age of Onset  . Other Mother        "blood infection"  . Heart attack Father     No Known Allergies  Current Outpatient Medications on File Prior to Visit  Medication Sig Dispense Refill  . acetaminophen (TYLENOL 8 HOUR ARTHRITIS PAIN) 650 MG CR tablet Take 1 tablet (650 mg total) by mouth every 8 (eight) hours as needed for pain. 30 tablet 0  . Ascorbic Acid (VITAMIN C) 1000 MG tablet Take 1,000 mg by mouth once a week.     Marland Kitchen aspirin EC 81 MG EC tablet Take 1 tablet (81 mg total) by mouth daily. 30 tablet 0  . cholecalciferol (VITAMIN D3) 25 MCG (1000 UT) tablet Take 1,000 Units by mouth once a week.    . clonazePAM (KLONOPIN) 0.5 MG tablet Take 1 tablet (0.5 mg total) by mouth 3 (three) times daily as needed for anxiety. 30 tablet 0  .  donepezil (ARICEPT) 10 MG tablet Take 10 mg by mouth.    . Emollient (COCOA BUTTER PETROLEUM JELLY) GEL Apply topically.    . felodipine (PLENDIL) 2.5 MG 24 hr tablet Take 1 tablet (2.5 mg total) by mouth daily. 90 tablet 0  . folic acid (FOLVITE) 1 MG tablet Take 1 mg by mouth daily.    . LamoTRIgine 200 MG TB24 24 hour tablet Take 2 tablets (400 mg total) by mouth at bedtime. 180 tablet 4  . linaclotide (LINZESS) 72 MCG capsule Take 1 capsule (72 mcg total) by mouth daily before breakfast. 30 capsule 2  . potassium chloride (MICRO-K) 10 MEQ CR capsule Take 1 capsule (10 mEq total) by mouth daily. 30 capsule 1  . pravastatin (PRAVACHOL) 10 MG tablet Take 1 tablet (10 mg total) by mouth at bedtime. 30 tablet 1  . QUEtiapine (SEROQUEL) 25 MG tablet Take 25 mg by mouth 2 (two) times daily.   3  . white petrolatum ointment Apply 1 application topically 3 (three) times daily as needed for dry skin.     No current facility-administered medications on file prior to visit.     BP (!) 154/71   Pulse 63   Temp 98.4 F (36.9 C) (Oral)   Resp 16   Ht 5\' 6"  (1.676 m)   Wt 163 lb 6.4 oz (74.1 kg)   SpO2 100%   BMI 26.37 kg/m       Objective:   Physical Exam   General Mental Status- Alert. General Appearance- Not in acute distress.   Skin General: Color- Normal Color. Moisture- Normal Moisture.  Neck Carotid Arteries- Normal color. Moisture- Normal Moisture. No carotid bruits. No JVD.  Chest and Lung Exam Auscultation: Breath Sounds:-Normal.  Cardiovascular Auscultation:Rythm- Regular. Murmurs & Other Heart Sounds:Auscultation of the heart reveals- No Murmurs.  Abdomen Inspection:-Inspeection Normal. Palpation/Percussion:Note:No mass. Palpation and Percussion of the abdomen reveal- Non Tender, Non Distended + BS, no rebound or guarding.   Neurologic Cranial Nerve exam:- CN III-XII intact(No nystagmus), symmetric smile. Strength:- 5/5 equal and symmetric strength both upper  and lower extremities.     Assessment & Plan:  For your blood pressure will increase plendil to 5 mg daily. Would like you blood pressure closer to 140/90 or even less.  For anxiety I am making clonazepam available but to use up to 1 every 8 hours as needed severe anxiety. But if only mild anxiety then  one tablet a day then just use it once a day. I would prefer to use max 60 tab every month rather than 90.  For hx of seizure disorder take lamictal as directed.  For dementia continue current medication.  Follow up in 3 weeks or as needed  40 minutes spent with patient today.  50% of time spent counseling patient and her husband regarding how I want them to use clonazepam.  Did extensively explain in her age group would not want her taking it every 8 hours.  Answered patient and husband's questions regarding this.  Also counseled on compliance with prescribed Lamictal by neurologist.

## 2018-12-08 NOTE — ED Provider Notes (Signed)
[facility] EMERGENCY DEPARTMENT Provider Note   CSN: [id number] Arrival date & time: 12/07/18  2306    History   Chief Complaint Chief Complaint  Patient presents with  . Medication Refill    HPI Angela Fields is a 68 y.o. female.     The history is provided by the patient.  Medication Refill  Medications/supplies requested:  Klonopin Reason for request:  Medications ran out Medications taken before: yes - see home medications   Patient has complete original prescription information: no   Source of information:  Patient reported - no other verification available Patient was seen for same several weeks ago and the medication was refilled by the patient's PMD.  They state the have been out of the Klonopin for some time.  They have not called the pharmacy to see if it is available.  The patient has been started by her new neurologist on Lamotogine and they are not seeing positive effects from this medication at this time.  Husband reports patient is jittery and anxious but patient is seated calmly in the bed.    Past Medical History:  Diagnosis Date  . Anxiety   . Dementia (HCC)   . Diabetes mellitus without complication (HCC)   . Hyperlipidemia   . Hypertension   . Mood disorder (HCC)   . Rheumatic fever   . Seizures (HCC)   . Sleep apnea   . Stroke [facility])     Patient Active Problem List   Diagnosis Date Noted  . Dementia with behavioral disturbance (HCC) 11/24/2018  . Hypoglycemia 07/30/2017  . Cognitive and behavioral changes 06/26/2017  . Seizure (HCC) 06/19/2017  . Status epilepticus (HCC)   . Bradycardia   . Hypokalemia   . Hypertension 09/24/2016  . History of CVA (cerebrovascular accident) 09/24/2016  . 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 Hyperlipidemia 09/24/2016    History reviewed. No pertinent surgical history.   OB History   No obstetric history on file.      Home Medications    Prior to Admission  medications   Medication Sig Start Date End Date Taking? Authorizing Provider  acetaminophen (TYLENOL 8 HOUR ARTHRITIS PAIN) 650 MG CR tablet Take 1 tablet (650 mg total) by mouth every 8 (eight) hours as needed for pain. 11/07/17   Charm Rings, NP  Ascorbic Acid (VITAMIN C) 1000 MG tablet Take 1,000 mg by mouth once a week.     [provider]  aspirin EC 81 MG EC tablet Take 1 tablet (81 mg total) by mouth daily. 08/05/17   Osvaldo Shipper, MD  cholecalciferol (VITAMIN D3) 25 MCG (1000 UT) tablet Take 1,000 Units by mouth once a week.    [provider]  clonazePAM (KLONOPIN) 0.5 MG tablet Take 1 tablet (0.5 mg total) by mouth 3 (three) times daily as needed for anxiety. 11/25/18   Saguier, Ramon Dredge, PA-C  donepezil (ARICEPT) 10 MG tablet Take 10 mg by mouth. 09/24/18   [provider]  Emollient (COCOA BUTTER PETROLEUM JELLY) GEL Apply topically.    [provider]  felodipine (PLENDIL) 2.5 MG 24 hr tablet Take 1 tablet (2.5 mg total) by mouth daily. 09/24/18   Saguier, Ramon Dredge, PA-C  folic acid (FOLVITE) 1 MG tablet Take 1 mg by mouth daily. 03/07/18   [provider]  LamoTRIgine 200 MG TB24 24 hour tablet Take 2 tablets (400 mg total) by mouth at bedtime. 11/24/18   Levert Feinstein, MD  linaclotide Karlene Einstein)  72 MCG capsule Take 1 capsule (72 mcg total) by mouth daily before breakfast. 09/15/18   Saguier, Ramon Dredge, PA-C  potassium chloride (MICRO-K) 10 MEQ CR capsule Take 1 capsule (10 mEq total) by mouth daily. 11/26/18   Saguier, Ramon Dredge, PA-C  pravastatin (PRAVACHOL) 10 MG tablet Take 1 tablet (10 mg total) by mouth at bedtime. 11/26/18   Saguier, Ramon Dredge, PA-C  QUEtiapine (SEROQUEL) 25 MG tablet Take 25 mg by mouth 2 (two) times daily.  02/13/18   [provider]  white petrolatum ointment Apply 1 application topically 3 (three) times daily as needed for dry skin.    [provider]    Family History Family History  Problem Relation Age of  Onset  . Other Mother        "blood infection"  . Heart attack Father     Social History Social History   Tobacco Use  . Smoking status: Former Games developer  . Smokeless tobacco: Never Used  . Tobacco comment: quit in 1970's  Substance Use Topics  . Alcohol use: No    Comment: none since 2010 - then only one drink per week  . Drug use: No     Allergies   Patient has no known allergies.   Review of Systems Review of Systems  Constitutional: Negative for fever.  Respiratory: Negative for shortness of breath.   Cardiovascular: Negative for chest pain.  Gastrointestinal: Negative for abdominal pain.  Psychiatric/Behavioral: Negative for dysphoric mood, hallucinations and self-injury. The patient is not hyperactive.   All other systems reviewed and are negative.    Physical Exam Updated Vital Signs BP (!) 166/86 (BP Location: Left Arm)   Pulse 81   Temp 98.3 F (36.8 C) (Oral)   Resp 16   Ht  (1.676 m)   Wt 77.1 kg   SpO2 98%   BMI 27.44 kg/m   Physical Exam Vitals signs and nursing note reviewed.  Constitutional:      General: She is not in acute distress.    Appearance: Normal appearance.  HENT:     Head: Normocephalic and atraumatic.     Nose: Nose normal.     Mouth/Throat:     Mouth: Mucous membranes are moist.     Pharynx: Oropharynx is clear.  Eyes:     Conjunctiva/sclera: Conjunctivae normal.     Pupils: Pupils are equal, round, and reactive to light.  Neck:     Musculoskeletal: Normal range of motion and neck supple.  Cardiovascular:     Rate and Rhythm: Normal rate and regular rhythm.     Pulses: Normal pulses.     Heart sounds: Normal heart sounds.  Pulmonary:     Effort: Pulmonary effort is normal.     Breath sounds: Normal breath sounds.  Abdominal:     General: Abdomen is flat. Bowel sounds are normal.     Tenderness: There is no abdominal tenderness.  Skin:    General: Skin is warm and dry.     Capillary Refill: Capillary refill  takes less than 2 seconds.  Neurological:     General: No focal deficit present.     Mental Status: She is alert.     GCS: GCS eye subscore is 4. GCS verbal subscore is 5. GCS motor subscore is 6.     Deep Tendon Reflexes: Reflexes normal.  Psychiatric:        Mood and Affect: Mood normal. Affect is not blunt or angry.  Speech: She is communicative. Speech is not rapid and pressured.        Behavior: Behavior normal. Behavior is not agitated or aggressive.      ED Treatments / Results  Labs (all labs ordered are listed, but only abnormal results are displayed) Labs Reviewed - No data to display  EKG None  Radiology Mr Brain Wo Contrast  Result Date: 12/07/2018  [facility] [address] [place], [place] [address] [phone] NEUROIMAGING REPORT STUDY DATE: 12/06/2018 PATIENT NAME: Jalesia Sites DOB: 17-Apr-1951 MRN: [id number] ORDERING CLINICIAN: Dr Terrace Arabia CLINICAL HISTORY:  6 year patient with seizure COMPARISON FILMS: CT Head wo 07/06/2018 EXAM: MRI Brain wo TECHNIQUE: MRI of the brain without contrast was obtained utilizing 5 mm axial slices with T1, T2, T2 flair, T2 star gradient echo and diffusion weighted views.  T1 sagittal and T2 coronal views were obtained. 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  There are moderate changes of periventricular and subcortical chronic microvascular ischemia.  There is mild degree of age-appropriate generalized cerebral atrophy.  No other structural lesion, tumor or acute infarct is noted.  Diffusion-weighted imaging is negative for acute infarct.  The orbits and paranasal  sinuses appear unremarkable.  Calvarium shows no abnormalities.  Pituitary gland appears to be partially atrophic.  The visualized portion of the craniovertebral junction and upper cervical spine appear unremarkable.  Flow voids of the large vessels of intracranial circulation appear to be patent.   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  No acute abnormalities noted. INTERPRETING PHYSICIAN: PRAMOD SETHI, MD Certified in  Neuroimaging by American Society of Neuroimaging and [employer]ities    Procedures Procedures (including critical care time)  Medications Ordered in ED Medications  LORazepam (ATIVAN) tablet 0.5 mg (0.5 mg Oral Given 12/08/18 0036)     Patient is resting comfortably in the room.  I have advised I will give one dose and have them call their PMD to discuss this medication and why it keeps running out early in the am.    Final Clinical Impressions(s) / ED Diagnoses   Return for pain, intractable cough, fevers >100.4 unrelieved by medication, shortness of breath, intractable vomiting, chest pain, shortness of breath, weakness numbness, changes in speech, facial asymmetry,abdominal pain, passing out,Inability to tolerate liquids or food, cough, altered mental status or any concerns. No signs of systemic illness or infection. The patient is nontoxic-appearing on exam and vital signs are within normal limits.   I have reviewed the triage vital signs and the nursing notes. Pertinent labs &imaging results that were available during my care of the patient were reviewed by me and considered in my medical decision making (see chart for details).  After history, exam, and medical workup I feel the patient has been appropriately medically screened and is safe for discharge home. Pertinent diagnoses were discussed with  the patient. Patient was given return precautions.   Mossie Gilder, MD 12/08/18 [id number]

## 2018-12-08 NOTE — Patient Instructions (Addendum)
For your blood pressure will increase plendil to 5 mg daily. Would like you blood pressure closer to 140/90 or even less.  For anxiety I am making clonazepam available but to use up to 1 every 8 hours as needed severe anxiety. But if only mild anxiety then  just use it once a day. I would prefer to use max 60 tab every month rather than 90.  For hx of seizure disorder take lamictal as directed.  For dementia continue current medication.  Follow up in 3 weeks or as needed

## 2018-12-08 NOTE — Telephone Encounter (Signed)
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  No acute abnormalities noted.

## 2018-12-14 ENCOUNTER — Other Ambulatory Visit: Payer: Self-pay | Admitting: Medical

## 2018-12-15 ENCOUNTER — Ambulatory Visit (INDEPENDENT_AMBULATORY_CARE_PROVIDER_SITE_OTHER): Payer: Medicare HMO | Admitting: Neurology

## 2018-12-15 ENCOUNTER — Encounter: Payer: Self-pay | Admitting: Neurology

## 2018-12-15 ENCOUNTER — Other Ambulatory Visit: Payer: Self-pay

## 2018-12-15 VITALS — BP 139/72 | HR 70 | Resp 18 | Ht 66.0 in | Wt 160.0 lb

## 2018-12-15 DIAGNOSIS — Z8673 Personal history of transient ischemic attack (TIA), and cerebral infarction without residual deficits: Secondary | ICD-10-CM

## 2018-12-15 DIAGNOSIS — R4189 Other symptoms and signs involving cognitive functions and awareness: Secondary | ICD-10-CM | POA: Diagnosis not present

## 2018-12-15 DIAGNOSIS — F0391 Unspecified dementia with behavioral disturbance: Secondary | ICD-10-CM

## 2018-12-15 DIAGNOSIS — R4689 Other symptoms and signs involving appearance and behavior: Secondary | ICD-10-CM

## 2018-12-15 DIAGNOSIS — R569 Unspecified convulsions: Secondary | ICD-10-CM

## 2018-12-15 NOTE — Telephone Encounter (Signed)
Please schedule his EEG.

## 2018-12-15 NOTE — Progress Notes (Signed)
PATIENT: Angela Fields DOB: 1951/05/31  Chief Complaint  Patient presents with  . Hx of seizures/dementia    New room. Here with husband. Had a sz. 4 days ago (unwitnessed). Was weaning off of Vimpat at the time. Is now only taking Lamictal  qhs. Reports compliance with Aricept and Seroquel. Pt. sts. memory is better; husband sts. memory is slightly worse. Has decreased Clonazepam to once daily/fim     HISTORICAL  Angela Fields is a 68 years old female, seen in request by her primary care PA Saguier, Ramon Dredge for evaluation of seizure, dementia, initial evaluation was on November 24, 2018.  She is accompanied by her husband at today's clinical visit, she was previously a patient of Dr. Windle Guard who has retired,  I have reviewed and summarized the referring note from the referring physician.  I was able to review her most recent evaluation with Dr. Windle Guard on September 24, 2018.  She lives with her husband of 45 years, they have no children, moved to [facility] in 2017.  She retired as a Theme park manager at age 42, then was actively involved in [employer], in 2010, while they were visiting [place], she suffered hemorrhagic stroke, with confusion, mild right-sided weakness,  She had a significant change since that event, she had gradual worsening memory loss, now she lives at home with her husband, has delusional idea, falsely accused her husband from hiding their money, involved into relationship, get agitated easily, she has no significant physical difficulty, enjoys walking, still able to dress, bathing, using toilet without assistance, but she has significant memory loss, was not cooperative on today's Mini-Mental Status Examination,  I reviewed report MRI of the brain in April 2018, remote infarction in the left occipital lobe, to mid temporal lobe, with enlargement of left lateral ventricle, in the distribution of left PCA, extensive small vessel disease, fluid devoid of right carotid  artery, MRA of the brain, intracranial atherosclerotic disease, but no significant large vessel disease.  She also had a history of seizures and stroke in 2010, a total of 20 seizures over past 10 years, most recent was in January 2019, has been on lamotrigine ER 200 twice a day, Vimpat 150 mg twice a day, her husband contributed her recurrent seizure due to missing her medications, sometimes withdrawal from clonazepam,  UPDATE December 15 2018: Since last visit, we simplify her medication, change to Lamotrigine ER  2 tabs qhs, vimpat was stopped. She continues to have hallucination, worry about the wall changing colors, feel something were all over her.    She thought that her and her husband's hands are covered with different things,   She had no recurrent seizure, sometimes has sundowning phenomenon, Seroquel 25 mg works well most of the time, advised her husband to change clonazepam 0.5 mg to every night as needed,  We personally reviewed MRI of the brain on December 07, 2018, remote large hemorrhagic left posterior cerebral artery infarction, periventricular white matter disease generalized atrophy  Laboratory evaluations, no significant abnormality on CMP, hemoglobin of 13.6, normal TSH  REVIEW OF SYSTEMS: Full 14 system review of systems performed and notable only for eye redness, cold intolerance, abdominal pain, rectal bleeding, constipation, snoring, frequent urination, memory loss, seizure, speech difficulty, weakness, passing out, agitation behavior problem, confusion decreased control attrition anxiety hallucinations walking difficulties  All other review of systems were negative.  ALLERGIES: No Known Allergies  HOME MEDICATIONS: Current Outpatient Medications  Medication Sig Dispense Refill  . acetaminophen (TYLENOL  8 HOUR ARTHRITIS PAIN) 650 MG CR tablet Take 1 tablet (650 mg total) by mouth every 8 (eight) hours as needed for pain. 30 tablet 0  . Ascorbic Acid (VITAMIN C)  1000 MG tablet Take 1,000 mg by mouth once a week.     Marland Kitchen aspirin EC 81 MG EC tablet Take 1 tablet (81 mg total) by mouth daily. 30 tablet 0  . cholecalciferol (VITAMIN D3) 25 MCG (1000 UT) tablet Take 1,000 Units by mouth once a week.    . clonazePAM (KLONOPIN) 0.5 MG tablet Take 1 tablet (0.5 mg total) by mouth 3 (three) times daily as needed for anxiety. (Patient taking differently: Take 0.5 mg by mouth daily. Pt. has decreased Clonazepam from tid to once daily/fim) 30 tablet 0  . clonazePAM (KLONOPIN) 0.5 MG tablet Take 1 tablet (0.5 mg total) by mouth 2 (two) times daily as needed for anxiety. 60 tablet 0  . donepezil (ARICEPT) 10 MG tablet Take 10 mg by mouth.    . Emollient (COCOA BUTTER PETROLEUM JELLY) GEL Apply topically.    . felodipine (PLENDIL) 5 MG 24 hr tablet Take 1 tablet (5 mg total) by mouth daily. 90 tablet 0  . folic acid (FOLVITE) 1 MG tablet Take 1 mg by mouth daily.    . LamoTRIgine 200 MG TB24 24 hour tablet Take 2 tablets (400 mg total) by mouth at bedtime. 180 tablet 4  . linaclotide (LINZESS) 72 MCG capsule Take 1 capsule (72 mcg total) by mouth daily before breakfast. 30 capsule 2  . potassium chloride (MICRO-K) 10 MEQ CR capsule Take 1 capsule (10 mEq total) by mouth daily. 30 capsule 1  . pravastatin (PRAVACHOL) 10 MG tablet Take 1 tablet (10 mg total) by mouth at bedtime. 30 tablet 1  . QUEtiapine (SEROQUEL) 25 MG tablet Take 25 mg by mouth 2 (two) times daily.   3  . white petrolatum ointment Apply 1 application topically 3 (three) times daily as needed for dry skin.     No current facility-administered medications for this visit.     PAST MEDICAL HISTORY: Past Medical History:  Diagnosis Date  . Anxiety   . Dementia (HCC)   . Diabetes mellitus without complication (HCC)   . Hyperlipidemia   . Hypertension   . Mood disorder (HCC)   . Rheumatic fever   . Seizures (HCC)   . Sleep apnea   . Stroke [facility])     PAST SURGICAL HISTORY: No past surgical  history on file.  FAMILY HISTORY: Family History  Problem Relation Age of Onset  . Other Mother        "blood infection"  . Heart attack Father     SOCIAL HISTORY: Social History   Socioeconomic History  . Marital status: Married    Spouse name: Not on file  . Number of children: 0  . Years of education: some college  . Highest education level: Not on file  Occupational History  . Occupation: Retired  Engineer, production  . Financial resource strain: Not on file  . Food insecurity:    Worry: Not on file    Inability: Not on file  . Transportation needs:    Medical: Not on file    Non-medical: Not on file  Tobacco Use  . Smoking status: Former Games developer  . Smokeless tobacco: Never Used  . Tobacco comment: quit in 1970's  Substance and Sexual Activity  . Alcohol use: No    Comment: none since 2010 - then  only one drink per week  . Drug use: No  . Sexual activity: Not on file  Lifestyle  . Physical activity:    Days per week: Not on file    Minutes per session: Not on file  . Stress: Not on file  Relationships  . Social connections:    Talks on phone: Not on file    Gets together: Not on file    Attends religious service: Not on file    Active member of club or organization: Not on file    Attends meetings of clubs or organizations: Not on file    Relationship status: Not on file  . Intimate partner violence:    Fear of current or ex partner: Not on file    Emotionally abused: Not on file    Physically abused: Not on file    Forced sexual activity: Not on file  Other Topics Concern  . Not on file  Social History Narrative   Lives at home with husband.   Right-handed.   No caffeine use.     PHYSICAL EXAM   Vitals:   12/15/18 1037  BP: 139/72  Pulse: 70  Resp: 18  Weight: 160 lb (72.6 kg)  Height: 5\' 6"  (1.676 m)    Not recorded      Body mass index is 25.82 kg/m.  PHYSICAL EXAMNIATION:  Gen: NAD, conversant, well nourised, obese, well groomed                      Cardiovascular: Regular rate rhythm, no peripheral edema, warm, nontender. Eyes: Conjunctivae clear without exudates or hemorrhage Neck: Supple, no carotid bruits. Pulmonary: Clear to auscultation bilaterally   NEUROLOGICAL EXAM:  MENTAL STATUS: Speech/cognition: Awake, following commands, paraphasic errors,  CRANIAL NERVES: CN II: Right visual field deficit on threat, pupils are round equal and briskly reactive to light. CN III, IV, VI: extraocular movement are normal. No ptosis. CN V: Facial sensation is intact to pinprick in all 3 divisions bilaterally. Corneal responses are intact.  CN VII: Face is symmetric with normal eye closure and smile. CN VIII: Hearing is normal to rubbing fingers CN IX, X: Palate elevates symmetrically. Phonation is normal. CN XI: Head turning and shoulder shrug are intact CN XII: Tongue is midline with normal movements and no atrophy.  MOTOR: There is no pronator drift of out-stretched arms. Muscle bulk and tone are normal. Muscle strength is normal.  REFLEXES: Reflexes are 2+ and symmetric at the biceps, triceps, knees, and ankles. Plantar responses are flexor.  SENSORY: Intact to light touch, pinprick, positional sensation and vibratory sensation are intact in fingers and toes.  COORDINATION: Rapid alternating movements and fine finger movements are intact. There is no dysmetria on finger-to-nose and heel-knee-shin.    GAIT/STANCE: Posture is normal. Gait is steady with normal steps, base, arm swing, and turning. Heel and toe walking are normal. Tandem gait is normal.  Romberg is absent.   DIAGNOSTIC DATA (LABS, IMAGING, TESTING) - I reviewed patient records, labs, notes, testing and imaging myself where available.   ASSESSMENT AND PLAN  Makennah Shambaugh is a 68 y.o. female   History of hemorrhagic stroke Seizure Dementia with agitation  Change to lamotrigine to ER 200 mg 2 tablets every night  Keep Seroquel 25 mg every  night, clonazepam as needed,   EEG  Levert Feinstein, M.D. Ph.D.  [facility] [address] [place], [place] [address] Ph: [phone] Fax: [phone]  [id number], Ramon Dredge,  PA-C

## 2018-12-16 ENCOUNTER — Telehealth: Payer: Self-pay | Admitting: Neurology

## 2018-12-16 MED ORDER — DIVALPROEX SODIUM ER 500 MG PO TB24: 500.0000 mg | ORAL_TABLET | Freq: Every day | ORAL | 3 refills | Status: AC

## 2018-12-16 NOTE — Telephone Encounter (Signed)
Angela Fields can you schedule this patient

## 2018-12-16 NOTE — Telephone Encounter (Signed)
Pt's husband on DPR called to inform us that since the pt has been taken off the Vimpat she has experienced several seizures including today. Please advise.

## 2018-12-16 NOTE — Telephone Encounter (Addendum)
I spoke to the patient's husband.  Reports his wife having a witnessed seizure this morning.  He was with her when it occurred.  She became confused and was lowered to the floor.  Her right leg and arm started twitching.  She was drooling from her mouth.  The symptoms lasted approximately three minutes.  She is now back to her baseline.  She has not missed any doses of her lamotrigine TB24.  Per vo by Dr. Terrace Arabia, she will added Depakote ER 500mg , one tablet at bedtime. He verbalized understanding of the plan and was in agreement.

## 2018-12-18 ENCOUNTER — Other Ambulatory Visit: Payer: Self-pay | Admitting: Medical

## 2018-12-22 ENCOUNTER — Other Ambulatory Visit: Payer: Self-pay | Admitting: *Deleted

## 2018-12-22 DIAGNOSIS — Z79899 Other long term (current) drug therapy: Secondary | ICD-10-CM

## 2018-12-22 DIAGNOSIS — R569 Unspecified convulsions: Secondary | ICD-10-CM

## 2018-12-22 NOTE — Telephone Encounter (Signed)
Per vo by Dr. Terrace Arabia, she would like this patient to come in for labs (lamotirigine, divalproex).  Also, she would like her EEG moved to an earlier date.  I returned the call to her husband.  Her EEG has been moved to 12/23/2018 at 10:45am.  He will have her here for check-in at 10:15am.  While here, she will also have her labs drawn.

## 2018-12-22 NOTE — Telephone Encounter (Signed)
Pts husband called in and stated for about 3-4 days pt has been confused and agitated and very unsteady especially today. He states he think it may be coming from the new meds she is currently on.

## 2018-12-23 ENCOUNTER — Other Ambulatory Visit (INDEPENDENT_AMBULATORY_CARE_PROVIDER_SITE_OTHER): Payer: Self-pay

## 2018-12-23 ENCOUNTER — Other Ambulatory Visit: Payer: Self-pay

## 2018-12-23 ENCOUNTER — Ambulatory Visit (INDEPENDENT_AMBULATORY_CARE_PROVIDER_SITE_OTHER): Payer: Medicare HMO | Admitting: Neurology

## 2018-12-23 DIAGNOSIS — R569 Unspecified convulsions: Secondary | ICD-10-CM | POA: Diagnosis not present

## 2018-12-23 DIAGNOSIS — Z0289 Encounter for other administrative examinations: Secondary | ICD-10-CM

## 2018-12-23 DIAGNOSIS — F0391 Unspecified dementia with behavioral disturbance: Secondary | ICD-10-CM

## 2018-12-23 DIAGNOSIS — Z79899 Other long term (current) drug therapy: Secondary | ICD-10-CM

## 2018-12-24 ENCOUNTER — Emergency Department (HOSPITAL_COMMUNITY)
Admission: EM | Admit: 2018-12-24 | Discharge: 2018-12-24 | Disposition: A | Discharge status: Home / Self Care | Payer: Medicare HMO | Attending: Emergency Medicine | Admitting: Emergency Medicine

## 2018-12-24 ENCOUNTER — Encounter (HOSPITAL_COMMUNITY): Payer: Self-pay

## 2018-12-24 ENCOUNTER — Emergency Department (HOSPITAL_COMMUNITY): Payer: Medicare HMO

## 2018-12-24 DIAGNOSIS — R51 Headache: Secondary | ICD-10-CM | POA: Diagnosis not present

## 2018-12-24 DIAGNOSIS — F0391 Unspecified dementia with behavioral disturbance: Secondary | ICD-10-CM | POA: Diagnosis not present

## 2018-12-24 DIAGNOSIS — Z7982 Long term (current) use of aspirin: Secondary | ICD-10-CM | POA: Insufficient documentation

## 2018-12-24 DIAGNOSIS — I10 Essential (primary) hypertension: Secondary | ICD-10-CM | POA: Insufficient documentation

## 2018-12-24 DIAGNOSIS — S0990XA Unspecified injury of head, initial encounter: Secondary | ICD-10-CM

## 2018-12-24 DIAGNOSIS — Z87891 Personal history of nicotine dependence: Secondary | ICD-10-CM | POA: Diagnosis not present

## 2018-12-24 DIAGNOSIS — W19XXXA Unspecified fall, initial encounter: Secondary | ICD-10-CM

## 2018-12-24 DIAGNOSIS — E119 Type 2 diabetes mellitus without complications: Secondary | ICD-10-CM | POA: Insufficient documentation

## 2018-12-24 DIAGNOSIS — Z79899 Other long term (current) drug therapy: Secondary | ICD-10-CM | POA: Insufficient documentation

## 2018-12-24 LAB — BASIC METABOLIC PANEL
Anion gap: 6 (ref 5–15)
BUN: 10 mg/dL (ref 8–23)
CO2: 29 mmol/L (ref 22–32)
CREATININE: 1.19 mg/dL — AB (ref 0.44–1.00)
Calcium: 9.1 mg/dL (ref 8.9–10.3)
Chloride: 107 mmol/L (ref 98–111)
GFR calc Af Amer: 54 mL/min — ABNORMAL LOW (ref >60)
GFR calc non Af Amer: 46 mL/min — ABNORMAL LOW (ref >60)
Glucose, Bld: 85 mg/dL (ref 70–99)
Potassium: 3.8 mmol/L (ref 3.5–5.1)
Sodium: 142 mmol/L (ref 135–145)

## 2018-12-24 LAB — URINALYSIS, ROUTINE W REFLEX MICROSCOPIC
BILIRUBIN URINE: NEGATIVE
Glucose, UA: NEGATIVE mg/dL
Ketones, ur: NEGATIVE mg/dL
Leukocytes,Ua: NEGATIVE
Nitrite: NEGATIVE
Protein, ur: NEGATIVE mg/dL
SPECIFIC GRAVITY, URINE: 1.011 (ref 1.005–1.030)
pH: 8 (ref 5.0–8.0)

## 2018-12-24 LAB — CBC WITH DIFFERENTIAL/PLATELET
Abs Immature Granulocytes: 0.02 10*3/uL (ref 0.00–0.07)
BASOS PCT: 1 %
Basophils Absolute: 0 10*3/uL (ref 0.0–0.1)
Eosinophils Absolute: 0 10*3/uL (ref 0.0–0.5)
Eosinophils Relative: 0 %
HCT: 41.6 % (ref 36.0–46.0)
Hemoglobin: 13.3 g/dL (ref 12.0–15.0)
Immature Granulocytes: 0 %
Lymphocytes Relative: 17 %
Lymphs Abs: 1.2 10*3/uL (ref 0.7–4.0)
MCH: 29.8 pg (ref 26.0–34.0)
MCHC: 32 g/dL (ref 30.0–36.0)
MCV: 93.3 fL (ref 80.0–100.0)
MONOS PCT: 5 %
Monocytes Absolute: 0.4 10*3/uL (ref 0.1–1.0)
Neutro Abs: 5.8 10*3/uL (ref 1.7–7.7)
Neutrophils Relative %: 77 %
Platelets: 223 10*3/uL (ref 150–400)
RBC: 4.46 MIL/uL (ref 3.87–5.11)
RDW: 13.7 % (ref 11.5–15.5)
WBC: 7.5 10*3/uL (ref 4.0–10.5)
nRBC: 0 % (ref 0.0–0.2)

## 2018-12-24 NOTE — ED Notes (Signed)
Per EMS family is not coming until tomorrow for patient
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
## 2018-12-24 NOTE — ED Notes (Addendum)
Called pt's husband to inform him that pt was being discharged and asked if he could come pick her up.  Pt's husband st's he told EMS when they picked her up that he would not be up here until tomorrow.  I informed him that pt was not being admitted and needed him to come get her.  Pt's husband stated again "I'am not coming up there until tomorrow".  I advised pt's husband that we had no where to keep his wife until tomorrow.  Pt's husband stated "just put her in a chair somewhere".  I advised him that wasn't appropriate.  He then said, "I need a rest, I won't be there until tomorrow.    Case Manager Burna Mortimer, RN made aware.
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
## 2018-12-24 NOTE — Discharge Instructions (Addendum)
There were no serious injuries found today.  Take Tylenol as needed for pain.  Return here, if needed, for problems.
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
## 2018-12-25 ENCOUNTER — Telehealth: Payer: Self-pay | Admitting: *Deleted

## 2018-12-25 LAB — LAMOTRIGINE LEVEL: Lamotrigine Lvl: 32.5 ug/mL (ref 2.0–20.0)

## 2018-12-25 LAB — VALPROIC ACID LEVEL: Valproic Acid Lvl: 87 ug/mL (ref 50–100)

## 2018-12-25 MED ORDER — LAMOTRIGINE ER 100 MG PO TB24: 300.0000 mg | ORAL_TABLET | Freq: Every day | ORAL | 6 refills | Status: DC

## 2018-12-25 NOTE — Telephone Encounter (Signed)
Lamotrigine level high at 32. Will reduce lamotrigine ER to 300mg  at bedtime.   Meds ordered this encounter  Medications  . LamoTRIgine 100 MG TB24 24 hour tablet    Sig: Take 3 tablets (300 mg total) by mouth at bedtime.    Dispense:  90 tablet    Refill:  6   Suanne Marker, MD 12/25/2018, 1:16 PM Certified in Neurology, Neurophysiology and Neuroimaging  [facility] [address] [place], [place] [address] [phone]

## 2018-12-25 NOTE — Telephone Encounter (Signed)
Called and spoke with husband, [name] on [place]. I advised him Dr Danae Orleans reviewed patient's labs. Her lamotrigine level is too high which could be cause of her confusion and falls. I advised [name] Dr Marjory Lies is sending in new prescription for her to take lamotrigine 300 mg at bedtime, beginning tonight. I confirmed pharmacy, [facility], [place]. I advised him she needs a repeat lab next Mon or Tuesday, no appointment needed. Advised him this office has dr on call on weekends.  [name] verbalized understanding, appreciation.

## 2018-12-26 DIAGNOSIS — E119 Type 2 diabetes mellitus without complications: Secondary | ICD-10-CM | POA: Diagnosis not present

## 2018-12-26 DIAGNOSIS — G309 Alzheimer's disease, unspecified: Secondary | ICD-10-CM | POA: Insufficient documentation

## 2018-12-26 DIAGNOSIS — F0281 Dementia in other diseases classified elsewhere with behavioral disturbance: Secondary | ICD-10-CM | POA: Insufficient documentation

## 2018-12-26 DIAGNOSIS — Z79899 Other long term (current) drug therapy: Secondary | ICD-10-CM | POA: Insufficient documentation

## 2018-12-26 DIAGNOSIS — F0391 Unspecified dementia with behavioral disturbance: Secondary | ICD-10-CM | POA: Diagnosis not present

## 2018-12-26 DIAGNOSIS — Z87891 Personal history of nicotine dependence: Secondary | ICD-10-CM | POA: Diagnosis not present

## 2018-12-26 DIAGNOSIS — F39 Unspecified mood [affective] disorder: Secondary | ICD-10-CM | POA: Insufficient documentation

## 2018-12-26 DIAGNOSIS — R4182 Altered mental status, unspecified: Secondary | ICD-10-CM | POA: Diagnosis present

## 2018-12-26 DIAGNOSIS — Z7982 Long term (current) use of aspirin: Secondary | ICD-10-CM | POA: Diagnosis not present

## 2018-12-26 DIAGNOSIS — I10 Essential (primary) hypertension: Secondary | ICD-10-CM | POA: Insufficient documentation

## 2018-12-26 NOTE — ED Triage Notes (Signed)
Pt brought into ED under IVC for increased confusion, dementia  and wondering. [employer] called by husband

## 2018-12-27 ENCOUNTER — Other Ambulatory Visit: Payer: Self-pay

## 2018-12-27 ENCOUNTER — Encounter (HOSPITAL_COMMUNITY): Payer: Self-pay | Admitting: Emergency Medicine

## 2018-12-27 ENCOUNTER — Emergency Department (HOSPITAL_COMMUNITY)
Admission: EM | Admit: 2018-12-27 | Discharge: 2018-12-28 | Disposition: A | Discharge status: Home / Self Care | Payer: Medicare HMO | Attending: Emergency Medicine | Admitting: Emergency Medicine

## 2018-12-27 ENCOUNTER — Emergency Department (HOSPITAL_COMMUNITY): Payer: Medicare HMO

## 2018-12-27 ENCOUNTER — Telehealth: Payer: Self-pay | Admitting: *Deleted

## 2018-12-27 DIAGNOSIS — G309 Alzheimer's disease, unspecified: Secondary | ICD-10-CM | POA: Diagnosis not present

## 2018-12-27 DIAGNOSIS — F03918 Unspecified dementia, unspecified severity, with other behavioral disturbance: Secondary | ICD-10-CM | POA: Diagnosis present

## 2018-12-27 DIAGNOSIS — F0391 Unspecified dementia with behavioral disturbance: Secondary | ICD-10-CM

## 2018-12-27 LAB — BASIC METABOLIC PANEL
Anion gap: 8 (ref 5–15)
BUN: 15 mg/dL (ref 8–23)
CO2: 29 mmol/L (ref 22–32)
Calcium: 9.3 mg/dL (ref 8.9–10.3)
Chloride: 106 mmol/L (ref 98–111)
Creatinine, Ser: 1.32 mg/dL — ABNORMAL HIGH (ref 0.44–1.00)
GFR calc Af Amer: 47 mL/min — ABNORMAL LOW (ref >60)
GFR calc non Af Amer: 41 mL/min — ABNORMAL LOW (ref >60)
Glucose, Bld: 90 mg/dL (ref 70–99)
Potassium: 4 mmol/L (ref 3.5–5.1)
Sodium: 143 mmol/L (ref 135–145)

## 2018-12-27 LAB — VALPROIC ACID LEVEL: Valproic Acid Lvl: 61 ug/mL (ref 50.0–100.0)

## 2018-12-27 LAB — CBC WITH DIFFERENTIAL/PLATELET
ABS IMMATURE GRANULOCYTES: 0.02 10*3/uL (ref 0.00–0.07)
Basophils Absolute: 0 10*3/uL (ref 0.0–0.1)
Basophils Relative: 1 %
Eosinophils Absolute: 0 10*3/uL (ref 0.0–0.5)
Eosinophils Relative: 0 %
HCT: 41.7 % (ref 36.0–46.0)
HEMOGLOBIN: 13 g/dL (ref 12.0–15.0)
Immature Granulocytes: 0 %
Lymphocytes Relative: 25 %
Lymphs Abs: 1.8 10*3/uL (ref 0.7–4.0)
MCH: 29.5 pg (ref 26.0–34.0)
MCHC: 31.2 g/dL (ref 30.0–36.0)
MCV: 94.8 fL (ref 80.0–100.0)
Monocytes Absolute: 0.5 10*3/uL (ref 0.1–1.0)
Monocytes Relative: 7 %
NEUTROS ABS: 4.9 10*3/uL (ref 1.7–7.7)
Neutrophils Relative %: 67 %
Platelets: 206 10*3/uL (ref 150–400)
RBC: 4.4 MIL/uL (ref 3.87–5.11)
RDW: 13.5 % (ref 11.5–15.5)
WBC: 7.3 10*3/uL (ref 4.0–10.5)
nRBC: 0 % (ref 0.0–0.2)

## 2018-12-27 LAB — URINALYSIS, ROUTINE W REFLEX MICROSCOPIC
Bacteria, UA: NONE SEEN
Bilirubin Urine: NEGATIVE
Glucose, UA: NEGATIVE mg/dL
Ketones, ur: 5 mg/dL — AB
Nitrite: NEGATIVE
Protein, ur: 30 mg/dL — AB
SPECIFIC GRAVITY, URINE: 1.023 (ref 1.005–1.030)
pH: 5 (ref 5.0–8.0)

## 2018-12-27 LAB — ETHANOL

## 2018-12-27 LAB — RAPID URINE DRUG SCREEN, HOSP PERFORMED
Amphetamines: NOT DETECTED
Barbiturates: NOT DETECTED
Benzodiazepines: NOT DETECTED
Cocaine: NOT DETECTED
Opiates: NOT DETECTED
Tetrahydrocannabinol: NOT DETECTED

## 2018-12-27 MED ORDER — ASPIRIN EC 81 MG PO TBEC
81.0000 mg | DELAYED_RELEASE_TABLET | Freq: Every day | ORAL | Status: DC
  Administered 2018-12-27 – 2018-12-28 (×2): 81 mg via ORAL
  Filled 2018-12-27 (×2): qty 1

## 2018-12-27 MED ORDER — FOLIC ACID 1 MG PO TABS
1.0000 mg | ORAL_TABLET | Freq: Every day | ORAL | Status: DC
  Administered 2018-12-27 – 2018-12-28 (×2): 1 mg via ORAL
  Filled 2018-12-27 (×2): qty 1

## 2018-12-27 MED ORDER — LACOSAMIDE 50 MG PO TABS
150.0000 mg | ORAL_TABLET | Freq: Two times a day (BID) | ORAL | Status: DC
  Administered 2018-12-27 – 2018-12-28 (×4): 150 mg via ORAL
  Filled 2018-12-27 (×6): qty 3

## 2018-12-27 MED ORDER — FELODIPINE ER 5 MG PO TB24
5.0000 mg | ORAL_TABLET | Freq: Every day | ORAL | Status: DC
  Administered 2018-12-27 – 2018-12-28 (×2): 5 mg via ORAL
  Filled 2018-12-27 (×2): qty 1

## 2018-12-27 MED ORDER — LAMOTRIGINE 100 MG PO TABS
100.0000 mg | ORAL_TABLET | Freq: Three times a day (TID) | ORAL | Status: DC
  Administered 2018-12-27 – 2018-12-28 (×4): 100 mg via ORAL
  Filled 2018-12-27 (×4): qty 1

## 2018-12-27 MED ORDER — CLONAZEPAM 0.5 MG PO TABS
0.5000 mg | ORAL_TABLET | Freq: Two times a day (BID) | ORAL | Status: DC | PRN
  Administered 2018-12-27 – 2018-12-28 (×2): 0.5 mg via ORAL
  Filled 2018-12-27 (×2): qty 1

## 2018-12-27 MED ORDER — DONEPEZIL HCL 5 MG PO TABS
10.0000 mg | ORAL_TABLET | Freq: Every day | ORAL | Status: DC
  Administered 2018-12-27 – 2018-12-28 (×2): 10 mg via ORAL
  Filled 2018-12-27 (×2): qty 2

## 2018-12-27 MED ORDER — QUETIAPINE FUMARATE 25 MG PO TABS
25.0000 mg | ORAL_TABLET | Freq: Two times a day (BID) | ORAL | Status: DC
  Administered 2018-12-27 – 2018-12-28 (×4): 25 mg via ORAL
  Filled 2018-12-27 (×4): qty 1
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
## 2018-12-27 NOTE — ED Notes (Signed)
Pt resting in bed. Bedd being cleaned and given new scrubs due to accident. Pt in no acute distress.
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
## 2018-12-27 NOTE — ED Notes (Signed)
Cletis Athens, RN was changing pt bed after being soiled with urine. Assisted by Sharia Reeve, EMT. Pt was sitting in chair with EMT standby while RN changed linens. Pt stood to remove soiled pants and lost balance. Pt experienced assisted fall with a mild hit to head on rubber piece of stretcher. Pt in no acute distress. Scan ordered by MD. Will follow up.

## 2018-12-27 NOTE — ED Notes (Signed)
Up to w/c to go to bathroom.

## 2018-12-27 NOTE — ED Notes (Signed)
Pt taken to shower by sitter 
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
## 2018-12-27 NOTE — ED Notes (Signed)
Pt belongings taken to TCU by security

## 2018-12-27 NOTE — ED Notes (Signed)
Bed: MB55 Expected date:  Expected time:  Means of arrival:  Comments: Hold for 16

## 2018-12-27 NOTE — Telephone Encounter (Signed)
I called the patient's husband to let him know our office would be closed Monday.  He informed me that his wife developed acute confusion and had a behavioral outburtst last night.  She left their house and went walking briskly down the street by herself.  He was not able to catch up with her so he called 911.  She was taken to the emergency room.  He told me that she will be transferring to a rehab facility temporarily.

## 2018-12-27 NOTE — Telephone Encounter (Signed)
Notes recorded by Maryland Pink, RN on 12/25/2018 at 1:16 PM EDT Called and spoke with husband, [name] on [place]. I advised him Dr Danae Orleans reviewed patient's labs. Her lamotrigine level is too high which could be cause of her confusion and falls. I advised [name] Dr Marjory Lies is sending in new prescription for her to take lamotrigine 300 mg at bedtime, beginning tonight. I confirmed pharmacy, [facility], [place]. I advised him she needs a repeat lab next Mon or Tuesday, no appointment needed. Advised him this office has dr on call on weekends if he needs the service. [name] verbalized understanding, appreciation.

## 2018-12-27 NOTE — ED Notes (Signed)
Pt resting at this time, sitter at bedside. 

## 2018-12-27 NOTE — ED Notes (Signed)
Provider, Dr. Bebe Shaggy, notified of pt agitation and inability to go to CT. Will reassess pt willingness

## 2018-12-27 NOTE — ED Notes (Signed)
Pt transported to TCU and escorted by security

## 2018-12-27 NOTE — ED Provider Notes (Signed)
Patient seen by psychiatry who recommends inpatient hospitalization   Lorre Nick, MD 12/27/18 1351

## 2018-12-27 NOTE — ED Provider Notes (Signed)
While nursing staff was attempting to change patient from her urine soaked scrubs, she slipped and fell and hit the right side of her head on the bed.  No LOC.  Nursing staff is at bedside to help break the fall.  She is awake alert but anxious and tearful.  Will obtain CT head.  She is not on anticoagulants   Zadie Rhine, MD 12/27/18 [phone]
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
## 2018-12-27 NOTE — Telephone Encounter (Signed)
-----   Message from Suanne Marker, MD sent at 12/25/2018  1:02 PM EDT ----- Lamotrigine level is high / toxic. COuld be causing falls and confusion. Recommend to reduce lamotrigine to 300mg  at bedtime. -VRP

## 2018-12-27 NOTE — Progress Notes (Signed)
Per Thedore Mins, MD, this pt requires psychiatric hospitalization at this time. CSW faxed information to the following facilities:  [facility] [place]. Ovid Curd [facility]  Stacy Gardner, [place] Care Coordination Department System [facility]  [phone]

## 2018-12-27 NOTE — ED Notes (Signed)
Bed: IY64 Expected date: 12/27/18 Expected time: 12:03 AM Means of arrival:  Comments: Hold per charge

## 2018-12-27 NOTE — Progress Notes (Signed)
CSW aware of consult- waiting on TTS consult.  Stacy Gardner, LCSW Care Coordination Department System [facility]  [phone]

## 2018-12-28 DIAGNOSIS — F0391 Unspecified dementia with behavioral disturbance: Secondary | ICD-10-CM

## 2018-12-28 DIAGNOSIS — G309 Alzheimer's disease, unspecified: Secondary | ICD-10-CM | POA: Diagnosis not present

## 2018-12-28 LAB — URINE CULTURE

## 2018-12-28 MED ORDER — LAMOTRIGINE 100 MG PO TABS: 150.0000 mg | ORAL_TABLET | Freq: Two times a day (BID) | ORAL | 0 refills | Status: DC

## 2018-12-28 NOTE — ED Notes (Signed)
Pt states she wanted her blankets fixed. Pt placed back in bed and all blankets were positioned back on the pt. Pt resting comfortably.

## 2018-12-28 NOTE — ED Notes (Signed)
Pt assisted to restroom via one person assist. Pt placed back in bed and given cup of ice water.

## 2018-12-28 NOTE — Progress Notes (Signed)
Consult request has been received. CSW attempting to follow up at present time.  CSW counseled pt's husband on SNF's, ALF's and Memory Care, how they work, who is qualified to go there, insurances that do and don't pay for them and how it is paid for otherwise.  CSW provided pt's husband with a list of Assisted Living Facilities in the [place] area and counseled pt on legal guardianship and how it is sometimes attained.  SW explained at length the process and critieria necessary for a pt to be referred for inpatient psychiatric hospitalization.  CSW actively listened to the pt's husband's difficulties and provided validation for pt's husband's difficulties.  Pt's husband was appreciative and thanked the CSW.  Pt's husband attempted to apologize to the CSW for speaking abruptly to the CSW in the past and CSW explained the CSW understood that the pt's husband is frustrated and why.    Please reconsult if future social work needs arise.  CSW signing off, as social work intervention is no longer needed.  Dorothe Pea. Haya Hemler, LCSW, LCAS, CSI Clinical Social Worker Ph: [phone]

## 2018-12-28 NOTE — Discharge Planning (Signed)
EDCM notified pt spouse to pick up pt as she has been cleared psychiatrically.  Spouse has agreed to pick up pt promptly.  No further EDCM needs identified at this time.

## 2018-12-28 NOTE — Telephone Encounter (Signed)
Spoke to patient's husband.  He has decided it is time for his wife to be placed in a facility for safety and care reasons.  He is going to tour some facilities and speak with their staff social workers.  He will let us know what paperwork is needed.  He is also going to notify her PCP about his decision.

## 2018-12-28 NOTE — Telephone Encounter (Signed)
Pts husband [name] (on [place]) called stating that the ED called and informed him that she is doing better and that they will be discharging her today. Pts husband would like a referral for the pt to be put in a facility permanently. Please advise.

## 2018-12-28 NOTE — BH Assessment (Signed)
[facility] Assessment Progress Note  Per Juanetta Beets, DO, this pt does not require psychiatric hospitalization at this time.  Pt is psychiatrically cleared.  Pt presents under IVC initiated by law enforcement, and upheld by EDP Lorre Nick, MD, which Dr Sharma Covert has rescinded.  Pt may benefit from neurology for follow up, but does not require behavioral health referrals at this time.  At 10:13 this Clinical research associate called social work to ask them to address pt's psychosocial needs.  Call rolled to voice mail, and as of this writing, return call is pending.  Pt's nurse, Marchelle Folks, has been notified.  [name], MA Triage Specialist [phone]

## 2018-12-28 NOTE — Progress Notes (Addendum)
CSW received a call from pt's RN stating pt's husband is here and is desiring placement.  RN states she provided pt's husband with placement resources.  CSW notes TOC CM told pt's husband pt nbeeds to be picked up and husband acknowledged and stated he would at approx 11:24pm.  CSW will continue to follow for D/C needs.  Dorothe Pea. Geneveive Furness, LCSW, LCAS, CSI Transitions of Care Clinical Social Worker Care Coordination Department Ph: [phone]

## 2018-12-28 NOTE — Telephone Encounter (Signed)
I was able to talk with patient and her husband following her EEG,  Patient presented to emergency room December 24, 2018 for increased confusion, was admitted to the hospital,

## 2018-12-28 NOTE — ED Notes (Signed)
Rounding provider Catha Nottingham, NP at bedside.

## 2018-12-28 NOTE — Consult Note (Addendum)
[facility]  12/28/2018 10:46 AM Philomena DohenyCarolyn Reindl  MRN:  [id number] Principal Problem: Dementia with behavioral disturbance [facility](HCC) Discharge Diagnoses: Principal Problem:   Dementia with behavioral disturbance [facility](HCC)  Subjective: 68 yo female who presented to the ED with confusion and wandering.  History of dementia.  Last night, she went to a neighbor's house and was reluctant to leave.  Her husband brought her here.  She was seen in June and recommended to go to a memory care center but the husband tried to take care of her instead.  Her dementia behaviors increase at night and decrease in the morning.  No suicidal/homicidal ideations, hallucinations, or substance abuse.  Her memory is poor, oriented to person only.  Her conversation wonders but pleasant and cooperative.  No threat to herself or others besides her wandering.  Her husband is currently searching for a facility but stable from a psychiatric perspective from the provider who saw her in June, 2019.  Total Time spent with patient: 45 minutes  Past Psychiatric History: dementia, anxiety, mood disorder  Past Medical History:  Past Medical History:  Diagnosis Date  . Anxiety   . Dementia (HCC)   . Diabetes mellitus without complication (HCC)   . Hyperlipidemia   . Hypertension   . Mood disorder (HCC)   . Rheumatic fever   . Seizures (HCC)   . Sleep apnea   . Stroke [facility](HCC)    History reviewed. No pertinent surgical history. Family History:  Family History  Problem Relation Age of Onset  . Other Mother        "blood infection"  . Heart attack Father    Family Psychiatric  History: none Social History:  Social History   Substance and Sexual Activity  Alcohol Use No   Comment: none since 2010 - then only one drink per week     Social History   Substance and Sexual Activity  Drug Use No    Social History   Socioeconomic History  . Marital status: Married    Spouse name: Not on file  . Number of children:  0  . Years of education: some college  . Highest education level: Not on file  Occupational History  . Occupation: Retired  Engineer, productionocial Needs  . Financial resource strain: Not on file  . Food insecurity:    Worry: Not on file    Inability: Not on file  . Transportation needs:    Medical: Not on file    Non-medical: Not on file  Tobacco Use  . Smoking status: Former Games developermoker  . Smokeless tobacco: Never Used  . Tobacco comment: quit in 1970's  Substance and Sexual Activity  . Alcohol use: No    Comment: none since 2010 - then only one drink per week  . Drug use: No  . Sexual activity: Not on file  Lifestyle  . Physical activity:    Days per week: Not on file    Minutes per session: Not on file  . Stress: Not on file  Relationships  . Social connections:    Talks on phone: Not on file    Gets together: Not on file    Attends religious service: Not on file    Active member of club or organization: Not on file    Attends meetings of clubs or organizations: Not on file    Relationship status: Not on file  Other Topics Concern  . Not on file  Social History Narrative   Lives at  home with husband.   Right-handed.   No caffeine use.    Has this patient used any form of tobacco in the last 30 days? (Cigarettes, Smokeless Tobacco, Cigars, and/or Pipes) Patient does not use tobacco products.  Current Medications: Current Facility-Administered Medications  Medication Dose Route Frequency Provider Last Rate Last Dose  . aspirin EC tablet 81 mg  81 mg Oral Daily Zadie Rhine, MD   81 mg at 12/28/18 0913  . clonazePAM (KLONOPIN) tablet 0.5 mg  0.5 mg Oral BID PRN Zadie Rhine, MD   0.5 mg at 12/28/18 0914  . donepezil (ARICEPT) tablet 10 mg  10 mg Oral Daily Zadie Rhine, MD   10 mg at 12/28/18 0913  . felodipine (PLENDIL) 24 hr tablet 5 mg  5 mg Oral Daily Zadie Rhine, MD   5 mg at 12/28/18 0915  . folic acid (FOLVITE) tablet 1 mg  1 mg Oral Daily Zadie Rhine, MD    1 mg at 12/28/18 0914  . lacosamide (VIMPAT) tablet 150 mg  150 mg Oral BID Zadie Rhine, MD   150 mg at 12/28/18 0913  . lamoTRIgine (LAMICTAL) tablet 100 mg  100 mg Oral Q8H Zadie Rhine, MD   100 mg at 12/28/18 7195  . QUEtiapine (SEROQUEL) tablet 25 mg  25 mg Oral BID Zadie Rhine, MD   25 mg at 12/28/18 9747   Current Outpatient Medications  Medication Sig Dispense Refill  . acetaminophen (TYLENOL 8 HOUR ARTHRITIS PAIN) 650 MG CR tablet Take 1 tablet (650 mg total) by mouth every 8 (eight) hours as needed for pain. 30 tablet 0  . aspirin EC 81 MG EC tablet Take 1 tablet (81 mg total) by mouth daily. 30 tablet 0  . clonazePAM (KLONOPIN) 0.5 MG tablet Take 1 tablet (0.5 mg total) by mouth 2 (two) times daily as needed for anxiety. 60 tablet 0  . divalproex (DEPAKOTE ER) 500 MG 24 hr tablet Take 1 tablet (500 mg total) by mouth at bedtime. 90 tablet 3  . donepezil (ARICEPT) 10 MG tablet Take 10 mg by mouth daily.     . felodipine (PLENDIL) 5 MG 24 hr tablet Take 1 tablet (5 mg total) by mouth daily. 90 tablet 0  . folic acid (FOLVITE) 1 MG tablet Take 1 mg by mouth daily.    Marland Kitchen linaclotide (LINZESS) 72 MCG capsule Take 1 capsule (72 mcg total) by mouth daily before breakfast. 30 capsule 2  . potassium chloride (MICRO-K) 10 MEQ CR capsule TAKE 1 CAPSULE BY MOUTH EVERY DAY (Patient taking differently: Take 10 mEq by mouth daily. ) 90 capsule 1  . pravastatin (PRAVACHOL) 10 MG tablet TAKE 1 TABLET BY MOUTH EVERYDAY AT BEDTIME (Patient taking differently: Take 10 mg by mouth at bedtime. ) 90 tablet 1  . QUEtiapine (SEROQUEL) 25 MG tablet Take 25 mg by mouth 2 (two) times daily.   3  . VIMPAT 150 MG TABS Take 150 mg by mouth 2 (two) times daily.    . white petrolatum ointment Apply 1 application topically 3 (three) times daily as needed for dry skin.    Marland Kitchen lamoTRIgine (LAMICTAL) 100 MG tablet Take 1.5 tablets (150 mg total) by mouth 2 (two) times daily. 120 tablet 0   PTA  Medications: (Not in a hospital admission)   Musculoskeletal: Strength & Muscle Tone: within normal limits Gait & Station: unsteady Patient leans: N/A  Psychiatric Specialty Exam: Physical Exam  Nursing note and vitals reviewed. Constitutional: She appears well-developed and  well-nourished.  HENT:  Head: Normocephalic.  Neck: Normal range of motion.  Respiratory: Effort normal.  Neurological: She is alert.  Psychiatric: She has a normal mood and affect. Her speech is normal. Thought content normal. Cognition and memory are impaired. She expresses impulsivity. She is inattentive.    Review of Systems  Psychiatric/Behavioral: Positive for memory loss.  All other systems reviewed and are negative.   Blood pressure (!) 157/76, pulse 97, temperature 98.1 F (36.7 C), temperature source Oral, resp. rate 16, height 5\' 5"  (1.651 m), weight 69.4 kg, SpO2 100 %.Body mass index is 25.46 kg/m.  General Appearance: Casual  Eye Contact:  Good  Speech:  Normal Rate  Volume:  Normal  Mood:  Elevated  Affect:  Congruent  Thought Process:  Descriptions of Associations: Tangential  Orientation:  Other:  person only   Thought Content:  Tangential  Suicidal Thoughts:  No  Homicidal Thoughts:  No  Memory:  Immediate;   Poor Recent;   Poor Remote;   Poor  Judgement:  Impaired  Insight:  Lacking  Psychomotor Activity:  Normal  Concentration:  Concentration: Poor and Attention Span: Poor  Recall:  Poor  Fund of Knowledge:  Poor  Language:  Good  Akathisia:  No  Handed:  Right  AIMS (if indicated):   N/A  Assets:  Housing Leisure Time Resilience Social Support  ADL's:  Impaired  Cognition:  Impaired,  Severe  Sleep:   N/A     Demographic Factors:  Age 33 or older  Loss Factors: NA  Historical Factors: Impulsivity  Risk Reduction Factors:   Sense of responsibility to family, Living with another person, especially a relative, Positive social support and Positive therapeutic  relationship  Continued Clinical Symptoms:  Memory loss  Cognitive Features That Contribute To Risk:  None    Suicide Risk:  Minimal: No identifiable suicidal ideation.  Patients presenting with no risk factors but with morbid ruminations; may be classified as minimal risk based on the severity of the depressive symptoms   Plan Of Care/Follow-up recommendations:  Dementia with behavioral disturbance: -Continue Aricept 10 mg daily -Continue Klonopin 0.5 mg BID PRN -Continue Seroquel 25 mg BID -Recommend memory care facility  Activity:  as tolerated Diet:  heart healthy diet  Disposition: discharge home Nanine Means, NP 12/28/2018, 10:46 AM    Patient seen face-to-face for psychiatric evaluation, chart reviewed and case discussed with the physician extender and developed treatment plan. Reviewed the information documented and agree with the treatment plan.  Juanetta Beets, DO 12/28/18 5:57 PM

## 2018-12-28 NOTE — Procedures (Signed)
   HISTORY: 68 years old female, with history of left occipital stroke, presenting with seizure-like spells  TECHNIQUE:  This is a routine 16 channel EEG recording with one channel devoted to a limited EKG recording.  It was performed during wakefulness, drowsiness and asleep.  Hyperventilation and photic stimulation were performed as activating procedures.  There are minimum muscle and movement artifact noted.  Upon maximum arousal, posterior dominant waking rhythm showed asymmetry, left side dysrhythmic, theta range activity, with low amplitude, right side has more rhythmic normal amplitude theta range 7 Hz activity, both side was reactive to eye opening and closure  Hyperventilation produced mild/moderate buildup with higher amplitude and the slower activities noted.  Photic stimulation did not alter the tracing.  During EEG recording, patient developed drowsiness and no deeper stage of sleep was achieved  During EEG recording, there was frequent F7, T3, occasionally C4  sharp waves,  EKG demonstrate sinus rhythm, with heart rate of 60 bpm  CONCLUSION: This is an abnormal EEG.  There is electrodiagnostic evidence of frequent F7, T3, occasionally C4 sharp transient, indicating irritability involving left temporal frontal, and right parietal region.  Patient is at high risk for developing complex partial seizure.  Levert Feinstein, M.D. Ph.D.  [facility] [address] [place], [place] [address] Phone: [phone] Fax:      [phone]

## 2018-12-28 NOTE — ED Notes (Signed)
Called pt spouse to come pick patient up, and states that he had called other facilities for placement on his own and that he needs a few hours as he is waiting on call backs. Writer explained that pt has been discharged since 1130, and that his pursuit of placement, could take longer than hours and that pt is ready and waiting for pick-up.

## 2018-12-29 ENCOUNTER — Ambulatory Visit (INDEPENDENT_AMBULATORY_CARE_PROVIDER_SITE_OTHER): Payer: Medicare HMO | Admitting: Medical

## 2018-12-29 ENCOUNTER — Other Ambulatory Visit: Payer: Self-pay

## 2018-12-29 ENCOUNTER — Encounter: Payer: Self-pay | Admitting: Medical

## 2018-12-29 ENCOUNTER — Telehealth: Payer: Self-pay

## 2018-12-29 VITALS — BP 110/52 | HR 59 | Temp 98.5°F | Resp 16 | Ht 65.0 in | Wt 169.0 lb

## 2018-12-29 DIAGNOSIS — G40909 Epilepsy, unspecified, not intractable, without status epilepticus: Secondary | ICD-10-CM | POA: Diagnosis not present

## 2018-12-29 DIAGNOSIS — K5900 Constipation, unspecified: Secondary | ICD-10-CM | POA: Diagnosis not present

## 2018-12-29 DIAGNOSIS — F419 Anxiety disorder, unspecified: Secondary | ICD-10-CM | POA: Diagnosis not present

## 2018-12-29 DIAGNOSIS — F0390 Unspecified dementia without behavioral disturbance: Secondary | ICD-10-CM | POA: Diagnosis not present

## 2018-12-29 MED ORDER — LINACLOTIDE 72 MCG PO CAPS: 72.0000 ug | ORAL_CAPSULE | Freq: Every day | ORAL | 1 refills | Status: AC

## 2018-12-29 MED ORDER — CLONAZEPAM 0.5 MG PO TABS: 0.5000 mg | ORAL_TABLET | Freq: Two times a day (BID) | ORAL | 0 refills | Status: AC | PRN

## 2018-12-29 NOTE — Telephone Encounter (Signed)
No treatment for [facility] ED  Per Dorna Leitz Pharm D

## 2018-12-29 NOTE — Telephone Encounter (Signed)
Copied from CRM [id number]. Topic: General - Inquiry >> Dec 28, 2018  1:42 PM Lynne Logan D wrote: Reason for CRM: Pt's husband [name] stated that he is wanting to place pt in a dementia community. Her sundowning has made her harder to guide. He would like to speak with Ramon Dredge about this and also get a referral incase he may need it. Pt and husband have OV's tomorrow. Pt's husband would also like recommendations for places as well. Please advise.  CB#[phone]

## 2018-12-29 NOTE — Patient Instructions (Addendum)
For recent behavioral disturbance and wandering outside, it appears that at this time for patient to be admitted to dementia care facility.  Patient's husband has numbers of various facilities and he is going to call and and investigate the options.  Weekend further discuss this later by phone if necessary.  I can fill out paperwork to facilitate process of getting her admitted as well.  Continue with her current medications.  I went ahead and sent in refill of clonazepam to fill when due.  We discussed today on how to use clonazepam if you see that she is getting agitated or argumentative later in the day.  For history of seizures and recent seizure seen on EEG, continue with recommendations by neurologist.  For daily constipation, I did refill your Linzess as you reported that helps a lot.  Follow-up date as needed are presently.  In light of covid 19 virus/pandemic want you and Nemesis to stay at home as much as possible.

## 2018-12-29 NOTE — Telephone Encounter (Signed)
Talked to husband today in office. Plan in place.

## 2018-12-29 NOTE — Progress Notes (Signed)
Subjective:    Patient ID: Angela Fields, female    DOB: January 23, 1951, 68 y.o.   MRN: [id number]  HPI  Pt in for follow up from the ED.  Pt left her house yesterday/wondering. Pt husband has hip pain and he was having trouble catching up with her so he called 911. So police came out to help. ED evaluation below.   Patient presents with law enforcement for for wandering outside and confused.  Patient with a history of dementia recent changes in her medications, and now is been wandering around appearing confused.  Police picked her up and she thought she was in [place]. No other details known at this time.  Patient history of dementia, she has had 6 ER visits in 6 months. She had recent CT head that showed no acute findings. It appears that her dementia is worsening and now she is wandering outside of her house and confused. She is currently appropriate and stable. Labs are pending, will consult social work and case management as she will likely need placement. 5:18 AM Overall labs reassuring.  Plan to have patient seen by case management social work as she may benefit from nursing home placement due to worsening dementia.   Pt was evaluated by behavioral health and they thinks she is time to be admitted to dementia care facility.   She fell and bumped her head at ED. She had negative CT done for caution sake.  Pt husband has about various numbers to call.   Review of Systems  Constitutional: Negative for chills, fatigue and fever.  Respiratory: Negative for cough, chest tightness, shortness of breath and wheezing.   Cardiovascular: Negative for chest pain and palpitations.  Gastrointestinal: Positive for constipation. Negative for abdominal pain, nausea and vomiting.       Daily constipation. But does well with linzess.  Musculoskeletal: Negative for back pain.  Neurological: Negative for dizziness, speech difficulty, numbness and headaches.       Behavioral disturbance. As ED  evaluated.  Hematological: Negative for adenopathy. Does not bruise/bleed easily.  Psychiatric/Behavioral: Negative for behavioral problems and confusion.    Past Medical History:  Diagnosis Date   Anxiety    Dementia (HCC)    Diabetes mellitus without complication (HCC)    Hyperlipidemia    Hypertension    Mood disorder (HCC)    Rheumatic fever    Seizures (HCC)    Sleep apnea    Stroke [facility])      Social History   Socioeconomic History   Marital status: Married    Spouse name: Not on file   Number of children: 0   Years of education: some college   Highest education level: Not on file  Occupational History   Occupation: Retired  Ecologist strain: Not on file   Food insecurity:    Worry: Not on file    Inability: Not on Occupational hygienist needs:    Medical: Not on file    Non-medical: Not on file  Tobacco Use   Smoking status: Former Smoker   Smokeless tobacco: Never Used   Tobacco comment: quit in 1970's  Substance and Sexual Activity   Alcohol use: No    Comment: none since 2010 - then only one drink per week   Drug use: No   Sexual activity: Not on file  Lifestyle   Physical activity:    Days per week: Not on file    Minutes per session: Not on  file   Stress: Not on file  Relationships   Social connections:    Talks on phone: Not on file    Gets together: Not on file    Attends religious service: Not on file    Active member of club or organization: Not on file    Attends meetings of clubs or organizations: Not on file    Relationship status: Not on file   Intimate partner violence:    Fear of current or ex partner: Not on file    Emotionally abused: Not on file    Physically abused: Not on file    Forced sexual activity: Not on file  Other Topics Concern   Not on file  Social History Narrative   Lives at home with husband.   Right-handed.   No caffeine use.    No past surgical history  on file.  Family History  Problem Relation Age of Onset   Other Mother        "blood infection"   Heart attack Father     No Known Allergies  Current Outpatient Medications on File Prior to Visit  Medication Sig Dispense Refill   acetaminophen (TYLENOL 8 HOUR ARTHRITIS PAIN) 650 MG CR tablet Take 1 tablet (650 mg total) by mouth every 8 (eight) hours as needed for pain. 30 tablet 0   aspirin EC 81 MG EC tablet Take 1 tablet (81 mg total) by mouth daily. 30 tablet 0   clonazePAM (KLONOPIN) 0.5 MG tablet Take 1 tablet (0.5 mg total) by mouth 2 (two) times daily as needed for anxiety. 60 tablet 0   divalproex (DEPAKOTE ER) 500 MG 24 hr tablet Take 1 tablet (500 mg total) by mouth at bedtime. 90 tablet 3   donepezil (ARICEPT) 10 MG tablet Take 10 mg by mouth daily.      felodipine (PLENDIL) 5 MG 24 hr tablet Take 1 tablet (5 mg total) by mouth daily. 90 tablet 0   folic acid (FOLVITE) 1 MG tablet Take 1 mg by mouth daily.     lamoTRIgine (LAMICTAL) 100 MG tablet Take 1.5 tablets (150 mg total) by mouth 2 (two) times daily. 120 tablet 0   linaclotide (LINZESS) 72 MCG capsule Take 1 capsule (72 mcg total) by mouth daily before breakfast. 30 capsule 2   potassium chloride (MICRO-K) 10 MEQ CR capsule TAKE 1 CAPSULE BY MOUTH EVERY DAY (Patient taking differently: Take 10 mEq by mouth daily. ) 90 capsule 1   pravastatin (PRAVACHOL) 10 MG tablet TAKE 1 TABLET BY MOUTH EVERYDAY AT BEDTIME (Patient taking differently: Take 10 mg by mouth at bedtime. ) 90 tablet 1   QUEtiapine (SEROQUEL) 25 MG tablet Take 25 mg by mouth 2 (two) times daily.   3   VIMPAT 150 MG TABS Take 150 mg by mouth 2 (two) times daily.     white petrolatum ointment Apply 1 application topically 3 (three) times daily as needed for dry skin.     No current facility-administered medications on file prior to visit.     BP (!) 110/52    Pulse (!) 59    Temp 98.5 F (36.9 C) (Oral)    Resp 16    Ht 5\' 5"  (1.651 m)     Wt 169 lb (76.7 kg)    SpO2 98%    BMI 28.12 kg/m       Objective:   Physical Exam  General Mental Status- Alert. General Appearance- Not in acute distress.   Skin  General: Color- Normal Color. Moisture- Normal Moisture.  Neck Carotid Arteries- Normal color. Moisture- Normal Moisture. No carotid bruits. No JVD.  Chest and Lung Exam Auscultation: Breath Sounds:-Normal.  Cardiovascular Auscultation:Rythm- Regular. Murmurs & Other Heart Sounds:Auscultation of the heart reveals- No Murmurs.  Abdomen Inspection:-Inspeection Normal. Palpation/Percussion:Note:No mass. Palpation and Percussion of the abdomen reveal- Non Tender, Non Distended + BS, no rebound or guarding.  Neurologic Cranial Nerve exam:- CN III-XII intact(No nystagmus), symmetric smile. Strength:- 5/5 equal and symmetric strength both upper and lower extremities.      Assessment & Plan:  For recent behavioral disturbance and wandering outside, it appears that at this time for patient to be admitted to dementia care facility.  Patient's husband has numbers of various facilities and he is going to call and and investigate the options.  Weekend further discuss this later by phone if necessary.  I can fill out paperwork to facilitate process of getting her admitted as well.  Continue with her current medications.  I went ahead and sent in refill of clonazepam to fill when due.  We discussed today on how to use clonazepam if you see that she is getting agitated or argumentative later in the day.  For history of seizures and recent seizure seen on EEG, continue with recommendations by neurologist.  For daily constipation, I did refill your Linzess as you reported that helps a lot.  Follow-up date as needed are presently.  In light of covid 19 virus/pandemic want you and Keiva to stay at home as much as possible.  Esperanza Richters, PA-C

## 2018-12-30 ENCOUNTER — Other Ambulatory Visit: Payer: Medicare HMO

## 2019-01-05 ENCOUNTER — Telehealth: Payer: Self-pay | Admitting: Medical

## 2019-01-05 NOTE — Telephone Encounter (Signed)
Patients husband called and stated he had questions in regards to patients medication. Called husband back, he is listed on the [place], at [phone] (mobile) no answer and left a voicemail to have him call back in regards to what concerns he is having and on what medications.

## 2019-01-05 NOTE — Telephone Encounter (Signed)
Pt's husband calling, pt present during call and also speaking to [place]. Wishing to review each medication.  Wife has expressive aphasia but appears she wants to make sure schedule is correct. Reviewed each medication with both pt and husband to their satisfaction. Does report she has been taking 300mg  of Lamictal at HS; ordered 150mg  (1.5 tabs) 2 times daily. Pt's husband verbalizes understanding of dosage and frequency. Advised to CB if amy further questions arise.

## 2019-01-07 NOTE — Telephone Encounter (Signed)
Pt's wife had a question. Pt's wife was not with him. Pt will give [place] a call back when he's back with his wife.

## 2019-01-23 ENCOUNTER — Inpatient Hospital Stay (HOSPITAL_COMMUNITY)
Admission: EM | Admit: 2019-01-23 | Discharge: 2019-02-12 | DRG: 871 | Disposition: E | Payer: Medicare HMO | Attending: Family Medicine | Admitting: Family Medicine

## 2019-01-23 ENCOUNTER — Other Ambulatory Visit: Payer: Self-pay

## 2019-01-23 ENCOUNTER — Encounter (HOSPITAL_COMMUNITY): Payer: Self-pay | Admitting: Emergency Medicine

## 2019-01-23 ENCOUNTER — Emergency Department (HOSPITAL_COMMUNITY): Payer: Medicare HMO

## 2019-01-23 DIAGNOSIS — E785 Hyperlipidemia, unspecified: Secondary | ICD-10-CM | POA: Diagnosis present

## 2019-01-23 DIAGNOSIS — R112 Nausea with vomiting, unspecified: Secondary | ICD-10-CM

## 2019-01-23 DIAGNOSIS — G9341 Metabolic encephalopathy: Secondary | ICD-10-CM | POA: Diagnosis present

## 2019-01-23 DIAGNOSIS — Z7189 Other specified counseling: Secondary | ICD-10-CM

## 2019-01-23 DIAGNOSIS — R509 Fever, unspecified: Secondary | ICD-10-CM

## 2019-01-23 DIAGNOSIS — Z79899 Other long term (current) drug therapy: Secondary | ICD-10-CM

## 2019-01-23 DIAGNOSIS — S0081XA Abrasion of other part of head, initial encounter: Secondary | ICD-10-CM

## 2019-01-23 DIAGNOSIS — I10 Essential (primary) hypertension: Secondary | ICD-10-CM | POA: Diagnosis present

## 2019-01-23 DIAGNOSIS — G40909 Epilepsy, unspecified, not intractable, without status epilepticus: Secondary | ICD-10-CM | POA: Diagnosis present

## 2019-01-23 DIAGNOSIS — J189 Pneumonia, unspecified organism: Secondary | ICD-10-CM

## 2019-01-23 DIAGNOSIS — Z66 Do not resuscitate: Secondary | ICD-10-CM | POA: Diagnosis present

## 2019-01-23 DIAGNOSIS — L816 Other disorders of diminished melanin formation: Secondary | ICD-10-CM | POA: Diagnosis present

## 2019-01-23 DIAGNOSIS — Z7982 Long term (current) use of aspirin: Secondary | ICD-10-CM

## 2019-01-23 DIAGNOSIS — K581 Irritable bowel syndrome with constipation: Secondary | ICD-10-CM | POA: Diagnosis present

## 2019-01-23 DIAGNOSIS — J1289 Other viral pneumonia: Secondary | ICD-10-CM | POA: Diagnosis present

## 2019-01-23 DIAGNOSIS — R0902 Hypoxemia: Secondary | ICD-10-CM

## 2019-01-23 DIAGNOSIS — W19XXXA Unspecified fall, initial encounter: Secondary | ICD-10-CM

## 2019-01-23 DIAGNOSIS — Z781 Physical restraint status: Secondary | ICD-10-CM

## 2019-01-23 DIAGNOSIS — E119 Type 2 diabetes mellitus without complications: Secondary | ICD-10-CM | POA: Diagnosis present

## 2019-01-23 DIAGNOSIS — Z8673 Personal history of transient ischemic attack (TIA), and cerebral infarction without residual deficits: Secondary | ICD-10-CM

## 2019-01-23 DIAGNOSIS — N179 Acute kidney failure, unspecified: Secondary | ICD-10-CM | POA: Diagnosis present

## 2019-01-23 DIAGNOSIS — R6520 Severe sepsis without septic shock: Secondary | ICD-10-CM | POA: Diagnosis present

## 2019-01-23 DIAGNOSIS — F05 Delirium due to known physiological condition: Secondary | ICD-10-CM | POA: Diagnosis present

## 2019-01-23 DIAGNOSIS — E7220 Disorder of urea cycle metabolism, unspecified: Secondary | ICD-10-CM | POA: Diagnosis present

## 2019-01-23 DIAGNOSIS — Z87891 Personal history of nicotine dependence: Secondary | ICD-10-CM

## 2019-01-23 DIAGNOSIS — J9601 Acute respiratory failure with hypoxia: Secondary | ICD-10-CM

## 2019-01-23 DIAGNOSIS — Z515 Encounter for palliative care: Secondary | ICD-10-CM

## 2019-01-23 DIAGNOSIS — F0391 Unspecified dementia with behavioral disturbance: Secondary | ICD-10-CM | POA: Diagnosis present

## 2019-01-23 DIAGNOSIS — F0390 Unspecified dementia without behavioral disturbance: Secondary | ICD-10-CM

## 2019-01-23 DIAGNOSIS — F419 Anxiety disorder, unspecified: Secondary | ICD-10-CM | POA: Diagnosis present

## 2019-01-23 DIAGNOSIS — F03918 Unspecified dementia, unspecified severity, with other behavioral disturbance: Secondary | ICD-10-CM | POA: Diagnosis present

## 2019-01-23 DIAGNOSIS — A4189 Other specified sepsis: Principal | ICD-10-CM | POA: Diagnosis present

## 2019-01-23 DIAGNOSIS — R569 Unspecified convulsions: Secondary | ICD-10-CM

## 2019-01-23 LAB — BASIC METABOLIC PANEL
Anion gap: 9 (ref 5–15)
BUN: 10 mg/dL (ref 8–23)
CO2: 27 mmol/L (ref 22–32)
Calcium: 8.2 mg/dL — ABNORMAL LOW (ref 8.9–10.3)
Chloride: 102 mmol/L (ref 98–111)
Creatinine, Ser: 1.05 mg/dL — ABNORMAL HIGH (ref 0.44–1.00)
GFR calc Af Amer: >60 mL/min (ref >60)
GFR calc non Af Amer: 54 mL/min — ABNORMAL LOW (ref >60)
Glucose, Bld: 92 mg/dL (ref 70–99)
Potassium: 3.4 mmol/L — ABNORMAL LOW (ref 3.5–5.1)
Sodium: 138 mmol/L (ref 135–145)

## 2019-01-23 LAB — URINALYSIS, ROUTINE W REFLEX MICROSCOPIC
Bacteria, UA: NONE SEEN
Bilirubin Urine: NEGATIVE
Glucose, UA: NEGATIVE mg/dL
Ketones, ur: 20 mg/dL — AB
Leukocytes,Ua: NEGATIVE
Nitrite: NEGATIVE
Protein, ur: 30 mg/dL — AB
Specific Gravity, Urine: 1.021 (ref 1.005–1.030)
pH: 6 (ref 5.0–8.0)

## 2019-01-23 LAB — CBC WITH DIFFERENTIAL/PLATELET
Abs Immature Granulocytes: 0.01 10*3/uL (ref 0.00–0.07)
Basophils Absolute: 0 10*3/uL (ref 0.0–0.1)
Basophils Relative: 0 %
Eosinophils Absolute: 0 10*3/uL (ref 0.0–0.5)
Eosinophils Relative: 0 %
HCT: 37.5 % (ref 36.0–46.0)
Hemoglobin: 11.8 g/dL — ABNORMAL LOW (ref 12.0–15.0)
Immature Granulocytes: 0 %
Lymphocytes Relative: 24 %
Lymphs Abs: 0.7 10*3/uL (ref 0.7–4.0)
MCH: 29.4 pg (ref 26.0–34.0)
MCHC: 31.5 g/dL (ref 30.0–36.0)
MCV: 93.5 fL (ref 80.0–100.0)
Monocytes Absolute: 0.2 10*3/uL (ref 0.1–1.0)
Monocytes Relative: 8 %
Neutro Abs: 1.9 10*3/uL (ref 1.7–7.7)
Neutrophils Relative %: 68 %
Platelets: 162 10*3/uL (ref 150–400)
RBC: 4.01 MIL/uL (ref 3.87–5.11)
RDW: 14.3 % (ref 11.5–15.5)
WBC: 2.8 10*3/uL — ABNORMAL LOW (ref 4.0–10.5)
nRBC: 0 % (ref 0.0–0.2)

## 2019-01-23 LAB — ETHANOL: Alcohol, Ethyl (B): <10 mg/dL (ref <10)

## 2019-01-23 MED ORDER — LAMOTRIGINE 150 MG PO TABS
150.0000 mg | ORAL_TABLET | Freq: Two times a day (BID) | ORAL | Status: DC
  Administered 2019-01-23 – 2019-02-01 (×12): 150 mg via ORAL
  Filled 2019-01-23 (×3): qty 1
  Filled 2019-01-23: qty 6
  Filled 2019-01-23 (×2): qty 1
  Filled 2019-01-23: qty 6
  Filled 2019-01-23: qty 1
  Filled 2019-01-23 (×2): qty 6
  Filled 2019-01-23 (×2): qty 1
  Filled 2019-01-23 (×2): qty 6
  Filled 2019-01-23 (×4): qty 1
  Filled 2019-01-23: qty 6
  Filled 2019-01-23: qty 1
  Filled 2019-01-23: qty 6

## 2019-01-23 MED ORDER — LACOSAMIDE 50 MG PO TABS
150.0000 mg | ORAL_TABLET | Freq: Two times a day (BID) | ORAL | Status: DC
  Administered 2019-01-23 – 2019-01-25 (×5): 150 mg via ORAL
  Filled 2019-01-23 (×6): qty 3

## 2019-01-23 MED ORDER — PRAVASTATIN SODIUM 10 MG PO TABS
10.0000 mg | ORAL_TABLET | Freq: Every day | ORAL | Status: DC
  Administered 2019-01-23 – 2019-01-28 (×5): 10 mg via ORAL
  Filled 2019-01-23 (×6): qty 1

## 2019-01-23 MED ORDER — ASPIRIN EC 81 MG PO TBEC
81.0000 mg | DELAYED_RELEASE_TABLET | Freq: Every day | ORAL | Status: DC
  Administered 2019-01-24 – 2019-01-30 (×6): 81 mg via ORAL
  Filled 2019-01-23 (×7): qty 1

## 2019-01-23 MED ORDER — QUETIAPINE FUMARATE 25 MG PO TABS
25.0000 mg | ORAL_TABLET | Freq: Two times a day (BID) | ORAL | Status: DC
  Administered 2019-01-23 – 2019-01-27 (×7): 25 mg via ORAL
  Filled 2019-01-23 (×11): qty 1

## 2019-01-23 MED ORDER — DIVALPROEX SODIUM ER 500 MG PO TB24
500.0000 mg | ORAL_TABLET | Freq: Every day | ORAL | Status: DC
  Administered 2019-01-23 – 2019-01-29 (×7): 500 mg via ORAL
  Filled 2019-01-23 (×11): qty 1

## 2019-01-23 MED ORDER — DONEPEZIL HCL 10 MG PO TABS
10.0000 mg | ORAL_TABLET | Freq: Every day | ORAL | Status: DC
  Administered 2019-01-24 – 2019-01-30 (×5): 10 mg via ORAL
  Filled 2019-01-23 (×7): qty 1

## 2019-01-23 MED ORDER — FELODIPINE ER 5 MG PO TB24
5.0000 mg | ORAL_TABLET | Freq: Every day | ORAL | Status: DC
  Administered 2019-01-24 – 2019-01-28 (×4): 5 mg via ORAL
  Filled 2019-01-23 (×5): qty 1

## 2019-01-23 MED ORDER — LINACLOTIDE 72 MCG PO CAPS
72.0000 ug | ORAL_CAPSULE | Freq: Every day | ORAL | Status: DC
  Administered 2019-01-24 – 2019-01-29 (×5): 72 ug via ORAL
  Filled 2019-01-23 (×8): qty 1

## 2019-01-23 MED ORDER — LACOSAMIDE 150 MG PO TABS: 150.0000 mg | ORAL_TABLET | Freq: Two times a day (BID) | ORAL | Status: DC

## 2019-01-23 MED ORDER — ASPIRIN 81 MG PO TBEC: 81.0000 mg | DELAYED_RELEASE_TABLET | Freq: Every day | ORAL | Status: DC

## 2019-01-23 NOTE — ED Notes (Signed)
This RN heard loud "thud", went to assess pt and upon entry to room, pt was found sitting on floor. Stated she got up because she needs her 5 pills. No injuries or pain noted, Mercedes PA rounded

## 2019-01-23 NOTE — ED Provider Notes (Signed)
[facility] EMERGENCY DEPARTMENT Provider Note   CSN: [id number] Arrival date & time: 02/09/2019  1355    History   Chief Complaint Chief Complaint  Patient presents with   Fall    HPI    Angela Fields is a 68 y.o. female with a PMHx of dementia, CVA, DM2, HTN, HLD, anxiety, seizures, and other conditions listed below, who presents to the ED with complaints of possible unwitnessed fall. LEVEL 5 CAVEAT DUE TO DEMENTIA, pt unable to provide any history.  When asked why she is here, she cannot tell me, she repeatedly states that she appreciates [place] and that she is here until she can go home.  She has 3 abrasions around her right eye but she cannot tell me how they got there.  Per EMS this was supposedly an unwitnessed fall, bystander saw/found her and called them.  No further information was obtained by nursing staff.  Patient cannot tell me who to call to get more information from.  She says she was walking but she can't tell me anything else.  She denies having any pain anywhere, denies having any chest pain or shortness of breath, headaches, abdominal pain, or any other complaints at this time.  History is significantly limited due to patient's dementia and apparent confusion.  The history is provided by the patient and medical records. The history is limited by the condition of the patient. No language interpreter was used.  Fall  Pertinent negatives include no chest pain, no abdominal pain, no headaches and no shortness of breath.    Past Medical History:  Diagnosis Date   Anxiety    Dementia (HCC)    Diabetes mellitus without complication (HCC)    Hyperlipidemia    Hypertension    Mood disorder (HCC)    Rheumatic fever    Seizures (HCC)    Sleep apnea    Stroke [facility])     Patient Active Problem List   Diagnosis Date Noted   Dementia with behavioral disturbance (HCC) 11/24/2018   Hypoglycemia 07/30/2017   Cognitive and behavioral changes  06/26/2017   Seizure (HCC) 06/19/2017   Status epilepticus (HCC)    Bradycardia    Hypokalemia    Hypertension 09/24/2016   History of CVA (cerebrovascular accident) 09/24/2016   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 No pertinent surgical history.   OB History   No obstetric history on file.      Home Medications    Prior to Admission medications   Medication Sig Start Date End Date Taking? Authorizing Provider  acetaminophen (TYLENOL 8 HOUR ARTHRITIS PAIN) 650 MG CR tablet Take 1 tablet (650 mg total) by mouth every 8 (eight) hours as needed for pain. 11/07/17   Charm Rings, NP  aspirin EC 81 MG EC tablet Take 1 tablet (81 mg total) by mouth daily. 08/05/17   Osvaldo Shipper, MD  clonazePAM (KLONOPIN) 0.5 MG tablet Take 1 tablet (0.5 mg total) by mouth 2 (two) times daily as needed for anxiety. Fill when due. 12/29/18   Saguier, Ramon Dredge, PA-C  divalproex (DEPAKOTE ER) 500 MG 24 hr tablet Take 1 tablet (500 mg total) by mouth at bedtime. 12/16/18   Levert Feinstein, MD  donepezil (ARICEPT) 10 MG tablet Take 10 mg by mouth daily.  09/24/18   [provider]  felodipine (PLENDIL) 5 MG 24 hr tablet Take 1 tablet (5 mg total) by mouth daily. 12/08/18  Saguier, Ramon Dredge, PA-C  folic acid (FOLVITE) 1 MG tablet Take 1 mg by mouth daily. 03/07/18   [provider]  lamoTRIgine (LAMICTAL) 100 MG tablet Take 1.5 tablets (150 mg total) by mouth 2 (two) times daily. 12/28/18   Charm Rings, NP  linaclotide [facility]) 72 MCG capsule Take 1 capsule (72 mcg total) by mouth daily before breakfast. 09/15/18   Saguier, Ramon Dredge, PA-C  linaclotide [facility]) 72 MCG capsule Take 1 capsule (72 mcg total) by mouth daily before breakfast. 12/29/18   Saguier, Ramon Dredge, PA-C  potassium chloride (MICRO-K) 10 MEQ CR capsule TAKE 1 CAPSULE BY MOUTH EVERY DAY Patient taking differently: Take 10 mEq by mouth daily.  12/18/18    Saguier, Ramon Dredge, PA-C  pravastatin (PRAVACHOL) 10 MG tablet TAKE 1 TABLET BY MOUTH EVERYDAY AT BEDTIME Patient taking differently: Take 10 mg by mouth at bedtime.  12/18/18   Saguier, Ramon Dredge, PA-C  QUEtiapine (SEROQUEL) 25 MG tablet Take 25 mg by mouth 2 (two) times daily.  02/13/18   [provider]  VIMPAT 150 MG TABS Take 150 mg by mouth 2 (two) times daily. 11/23/18   [provider]  white petrolatum ointment Apply 1 application topically 3 (three) times daily as needed for dry skin.    [provider]    Family History Family History  Problem Relation Age of Onset   Other Mother        "blood infection"   Heart attack Father     Social History Social History   Tobacco Use   Smoking status: Former Smoker   Smokeless tobacco: Never Used   Tobacco comment: quit in 1970's  Substance Use Topics   Alcohol use: No    Comment: none since 2010 - then only one drink per week   Drug use: No     Allergies   Patient has no known allergies.   Review of Systems Review of Systems  Unable to perform ROS: Dementia  Respiratory: Negative for shortness of breath.   Cardiovascular: Negative for chest pain.  Gastrointestinal: Negative for abdominal pain.  Skin: Positive for wound.  Allergic/Immunologic: Positive for immunocompromised state (DM2).  Neurological: Negative for headaches.  LEVEL 5 CAVEAT DUE TO DEMENTIA   Physical Exam Updated Vital Signs BP (!) 143/85    Pulse 71    Temp 99.2 F (37.3 C) (Oral)    Resp (!) 22    Wt 69.4 kg    SpO2 100%    BMI 25.46 kg/m   Physical Exam Vitals signs and nursing note reviewed.  Constitutional:      General: She is not in acute distress.    Appearance: Normal appearance. She is well-developed. She is not toxic-appearing.     Interventions: Cervical collar in place.     Comments: Afebrile, nontoxic, NAD, C-collar in place  HENT:     Head: Normocephalic. Abrasion and contusion present. No raccoon eyes  or Battle's sign.     Comments: Small amount of dried blood to R lower lip, no wounds or abrasions noted to lips, no swelling. 3 small abrasions to R forehead/periorbital areas. No scalp or facial tenderness or crepitus, no deformities.  Eyes:     General:        Right eye: No discharge.        Left eye: No discharge.     Extraocular Movements: Extraocular movements intact.     Conjunctiva/sclera: Conjunctivae normal.     Comments: EOMI, PERRL  Neck:  Musculoskeletal: No spinous process tenderness or muscular tenderness.     Comments: C-collar in place, no spinous process or paracervical muscle TTP on exam. No bony stepoffs or deformities.  Cardiovascular:     Rate and Rhythm: Normal rate and regular rhythm.     Pulses: Normal pulses.     Heart sounds: Normal heart sounds, S1 normal and S2 normal. No murmur. No friction rub. No gallop.   Pulmonary:     Effort: Pulmonary effort is normal. No respiratory distress.     Breath sounds: Normal breath sounds. No decreased breath sounds, wheezing, rhonchi or rales.  Abdominal:     General: Bowel sounds are normal. There is no distension.     Palpations: Abdomen is soft. Abdomen is not rigid.     Tenderness: There is no abdominal tenderness. There is no right CVA tenderness, left CVA tenderness, guarding or rebound. Negative signs include Murphy's sign and McBurney's sign.  Musculoskeletal: Normal range of motion.     Comments: MAE x4 Strength and sensation grossly intact in all extremities No focal bony or joint line TTP to all extremities No hip tenderness C-spine as above, all other spinal levels nonTTP without bony stepoffs or deformities   Skin:    General: Skin is warm and dry.     Findings: No rash.  Neurological:     Mental Status: She is alert.     GCS: GCS eye subscore is 4. GCS verbal subscore is 4. GCS motor subscore is 6.     Sensory: Sensation is intact. No sensory deficit.     Motor: Motor function is intact.      Comments: Obeys simple commands, alert and oriented to person and time but not place, repeatedly states she appreciates [place] and that her husband is sick but won't give any information about how she fell or how she ended up here, won't give information for family to reach to talk to them.   Psychiatric:        Mood and Affect: Mood and affect normal.        Behavior: Behavior normal.      ED Treatments / Results  Labs (all labs ordered are listed, but only abnormal results are displayed) Labs Reviewed  CBC WITH DIFFERENTIAL/PLATELET - Abnormal; Notable for the following components:      Result Value   WBC 2.8 (*)    Hemoglobin 11.8 (*)    All other components within normal limits  BASIC METABOLIC PANEL - Abnormal; Notable for the following components:   Potassium 3.4 (*)    Creatinine, Ser 1.05 (*)    Calcium 8.2 (*)    GFR calc non Af Amer 54 (*)    All other components within normal limits  URINALYSIS, ROUTINE W REFLEX MICROSCOPIC - Abnormal; Notable for the following components:   Hgb urine dipstick SMALL (*)    Ketones, ur 20 (*)    Protein, ur 30 (*)    All other components within normal limits  ETHANOL    EKG EKG Interpretation  Date/Time:  Saturday January 23 2019 14:08:13 EDT Ventricular Rate:  75 PR Interval:    QRS Duration: 93 QT Interval:  400 QTC Calculation: 447 R Axis:   -25 Text Interpretation:  Sinus rhythm LVH by voltage Borderline T abnormalities, anterior leads no significant change since Mar 2020 Confirmed by Pricilla Loveless [phone]) on 02/03/2019 2:41:38 PM   Radiology Ct Head Wo Contrast  Result Date: 01/29/2019 CLINICAL DATA:  Fall with head  injury.  No loss of consciousness. EXAM: CT HEAD WITHOUT CONTRAST CT CERVICAL SPINE WITHOUT CONTRAST TECHNIQUE: Multidetector CT imaging of the head and cervical spine was performed following the standard protocol without intravenous contrast. Multiplanar CT image reconstructions of the cervical spine were also  generated. COMPARISON:  CT 12/27/2018 and 12/24/2018. Cervical spine CT 12/24/2018. FINDINGS: CT HEAD FINDINGS Brain: There is no evidence of acute intracranial hemorrhage, mass lesion, brain edema or extra-axial fluid collection. Extensive chronic encephalomalacia is again noted in the left parietooccipital lobe with associated ventriculomegaly and patchy periventricular white matter disease. No evidence of acute infarct or hydrocephalus. Vascular: Left carotid siphon aneurysm coil again noted. No hyperdense vessel. Skull: Negative for fracture or focal lesion. Sinuses/Orbits: The visualized paranasal sinuses, mastoid air cells and middle ears are clear. No orbital abnormalities are seen. Other: None. CT CERVICAL SPINE FINDINGS Alignment: Mild convex left scoliosis.  Normal sagittal alignment. 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 No evidence of acute fracture or traumatic subluxation. Soft tissues and spinal canal: No prevertebral fluid or swelling. No visible canal hematoma. Disc levels: Multilevel spondylosis again noted, greatest at C5-6 and C6-7. No high-grade spinal stenosis identified. There are scattered facet degenerative changes which are worst on the left at C4-5. Upper chest: Unremarkable. Other: None. IMPRESSION: 1. No acute intracranial or calvarial findings. 2. Stable chronic left parietooccipital encephalomalacia, small vessel ischemic changes and ventriculomegaly. 3. No evidence of cervical spine fracture, traumatic subluxation or static signs of instability. Electronically Signed   By: Carey BullocksWilliam  Veazey M.D.   On: 12-21-2018 16:27   Ct Cervical Spine Wo Contrast  Result Date: May 17, 2019 CLINICAL DATA:  Fall with head injury.  No loss of consciousness. EXAM: CT HEAD WITHOUT CONTRAST CT CERVICAL SPINE WITHOUT CONTRAST TECHNIQUE: Multidetector CT imaging of the head and cervical spine was performed following the standard protocol without intravenous  contrast. Multiplanar CT image reconstructions of the cervical spine were also generated. COMPARISON:  CT 12/27/2018 and 12/24/2018. Cervical spine CT 12/24/2018. FINDINGS: CT HEAD FINDINGS Brain: There is no evidence of acute intracranial hemorrhage, mass lesion, brain edema or extra-axial fluid collection. Extensive chronic encephalomalacia is again noted in the left parietooccipital lobe with associated ventriculomegaly and patchy periventricular white matter disease. No evidence of acute infarct or hydrocephalus. Vascular: Left carotid siphon aneurysm coil again noted. No hyperdense vessel. Skull: Negative for fracture or focal lesion. Sinuses/Orbits: The visualized paranasal sinuses, mastoid air cells and middle ears are clear. No orbital abnormalities are seen. Other: None. CT CERVICAL SPINE FINDINGS Alignment: Mild convex left scoliosis.  Normal sagittal alignment. 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 No evidence of acute fracture or traumatic subluxation. Soft tissues and spinal canal: No prevertebral fluid or swelling. No visible canal hematoma. Disc levels: Multilevel spondylosis again noted, greatest at C5-6 and C6-7. No high-grade spinal stenosis identified. There are scattered facet degenerative changes which are worst on the left at C4-5. Upper chest: Unremarkable. Other: None. IMPRESSION: 1. No acute intracranial or calvarial findings. 2. Stable chronic left parietooccipital encephalomalacia, small vessel ischemic changes and ventriculomegaly. 3. No evidence of cervical spine fracture, traumatic subluxation or static signs of instability. Electronically Signed   By: Carey BullocksWilliam  Veazey M.D.   On: 12-21-2018 16:27    Procedures Procedures (including critical care time)  Medications Ordered in ED Medications  divalproex (DEPAKOTE ER) 24 hr tablet 500 mg (500 mg Oral Given 06/17/2019 1840)  donepezil (ARICEPT) tablet 10 mg (has no administration  in time  range)  felodipine (PLENDIL) 24 hr tablet 5 mg (has no administration in time range)  lamoTRIgine (LAMICTAL) tablet 150 mg (150 mg Oral Given 02-10-2019 1840)  linaclotide (LINZESS) capsule 72 mcg (has no administration in time range)  pravastatin (PRAVACHOL) tablet 10 mg (has no administration in time range)  QUEtiapine (SEROQUEL) tablet 25 mg (25 mg Oral Given 2019/02/10 1840)  aspirin EC tablet 81 mg (has no administration in time range)  lacosamide (VIMPAT) tablet 150 mg (has no administration in time range)     Initial Impression / Assessment and Plan / ED Course  I have reviewed the triage vital signs and the nursing notes.  Pertinent labs & imaging results that were available during my care of the patient were reviewed by me and considered in my medical decision making (see chart for details).        68 y.o. female here after possible unwitnessed fall.  Patient cannot give me any information about this.  She has 3 abrasions to her right face/forehead, but denies having any pain anywhere and having any complaints.  C-collar in place.  No spinal tenderness.  Able to follow simple commands, oriented to time and person but not place, repeatedly states that she is appreciative of [place] and that her husband is sick so we cannot call him.  She will give me any information as to how to reach someone who can give me more information.  No tenderness to the face or scalp, no tenderness to the extremities or hips.  Symmetric strength in all extremities, sensation grossly intact.  Unclear what the etiology of her fall was, but will proceed with basic labs, CT head, CT neck, and reassess.  Will attempt to try to contact someone her family, called both numbers listed in her chart which led to voicemails.  We will continue attempting to reach someone.  Discussed case with my attending Dr. Criss Alvine who agrees with plan.  3:52 PM EKG nonischemic. CBC w/diff with very mild anemia at 11.8 and mild leukopenia 2.8,  otherwise WNL. BMP with marginally low K 3.4, and marginally elevated Cr 1.05, otherwise WNL. U/A without evidence of UTI. EtOH and CTs pending. I've personally attempted to contact both numbers listed in patient's chart several times and no one answers. Per nursing staff, apparently this has happened before with this pt, that the family/husband won't answer his calls. Chart review reveals that perhaps there was possibility of placement into SNF at last ED visit. Will contact social work to assist in getting in touch with family and see about placement. Will reassess shortly.   4:34 PM EtOH undetectable. CT head/neck negative for acute findings.  Social work attempting to contact family. Pt still calm and cooperative, with no complaints. Will continue to attempt to get in touch with family to help with disposition.   5:40 PM Police officer calling me stating that they went to the home to try to find her husband, no one at home. Social work still trying to contact someone to get her. Apparently the pt got out of bed to get dressed and fell, unwitnessed. I reassessed the pt and she appears well, no new injuries, has no complaints. Moving all extremities. No tenderness anywhere. Will continue to monitor until we can get a hold of someone to retrieve her.   8:00 PM Pt remains unchanged. I've attempted multiple more times to contact someone to come get the pt and have been unable to reach anyone. Multiple voicemails left.  Patient care to be resumed by Dr. Emily Filbert at shift change sign-out. Patient history has been discussed with provider resuming care. Please see their notes for further documentation of pending results and dispo/care. Pt stable at sign-out and updated on transfer of care.    Final Clinical Impressions(s) / ED Diagnoses   Final diagnoses:  Fall, initial encounter  Abrasion of face, initial encounter    ED Discharge Orders    [address] [place], [place] 01-Feb-2019 2001      Pricilla Loveless, MD 01/25/19 1406

## 2019-01-23 NOTE — ED Notes (Signed)
Pt very coonfused  Attempts to get olut of the bed is not watched closely  Side rails up both sides stretcher low but feet elevated to make it harder to get off the bed  Seeing people not in room  Confused and desoriented skin warm and dry

## 2019-01-23 NOTE — Progress Notes (Addendum)
CSW called pt's husband Baraa Labarr at ph: [phone] and [phone] and CSW left a HIPPA-compliant VM stating pt is expected to be ready for D/C shortly and pt's [employer] wanted to provide pt's husband with an update on pt's medical condition.  CSW will continue to follow for D/C needs.  EPD/RN updated.  CSW will continue to follow for D/C needs.  Dorothe Pea. Shan Valdes, LCSW, LCAS, CSI Transitions of Care Clinical Social Worker Care Coordination Department Ph: [phone]

## 2019-01-23 NOTE — ED Triage Notes (Signed)
Pt in after mechanical fall witnessed by bystander, fell onto cement and hit head, no LOC. Unknown if taking blood thinners. Pt has hx of CVA with aphasia, confused. 3 R eye abrasions present and black lip present. In c-collar, sats 90% on RA, placed on 2L

## 2019-01-23 NOTE — Progress Notes (Signed)
CSW received a call from EPD stating although CT scans are pending it is likely pt wil be ready for D/C and staff has been unable to speak to family/pt's husband.  Pt/pt's husband are well-known to this Clinical research associate due to past ED visits where pt's husband has insisted on "placement".  In past ED visits pt's husband has refused to p/u the pt until the following day and has been rude and verbally forceful and daring staff (including this Clinical research associate and others) to call APS if staff states pt's husband will be reported to APS for abandonment.  On pt's last visit to [facility] ED pn 3/16 this writer was able to explain to pt's husband why pt was not appropriate for placement due to dementia to an inpatient psychiatric facility for short-term psychiatric treatment (pt is not SI/HI/AVH) and why pt is not appropriate for long-term ALF placement (no payor, such as Medicaid and pt's husband cannot afford to private pay). CSW had explained to pt's husband the necessity of the pt's husband to obtain guardianship, apply for Medicaid, and seek long-term placement for his wife in an ALF.  Pt's husband had been appreciative, had thanked the CSW and had apologized for past behavior towards the CSW.  CSW will continue to follow for D/C needs.  Dorothe Pea. Jahmar Mckelvy, LCSW, LCAS, CSI Transitions of Care Clinical Social Worker Care Coordination Department Ph: [phone]

## 2019-01-23 NOTE — Progress Notes (Addendum)
CSW called non-emergency dispatch and requested a welfare check to pt's residence to seek pt's husband's attention, check on pt's husband's safety and to alert him to pt's imminent D/C and to make him aware pt's [employer]PD would like to inform him of pt's condition.  CSW will continue to follow for D/C needs.  Dorothe Pea. Tequia Wolman, LCSW, LCAS, CSI Transitions of Care Clinical Social Worker Care Coordination Department Ph: [phone]

## 2019-01-24 ENCOUNTER — Emergency Department (HOSPITAL_COMMUNITY): Payer: Medicare HMO

## 2019-01-24 DIAGNOSIS — Z87891 Personal history of nicotine dependence: Secondary | ICD-10-CM | POA: Diagnosis not present

## 2019-01-24 DIAGNOSIS — F419 Anxiety disorder, unspecified: Secondary | ICD-10-CM | POA: Diagnosis present

## 2019-01-24 DIAGNOSIS — R0902 Hypoxemia: Secondary | ICD-10-CM | POA: Insufficient documentation

## 2019-01-24 DIAGNOSIS — J189 Pneumonia, unspecified organism: Secondary | ICD-10-CM | POA: Diagnosis not present

## 2019-01-24 DIAGNOSIS — J1289 Other viral pneumonia: Secondary | ICD-10-CM | POA: Diagnosis present

## 2019-01-24 DIAGNOSIS — F05 Delirium due to known physiological condition: Secondary | ICD-10-CM | POA: Diagnosis present

## 2019-01-24 DIAGNOSIS — I10 Essential (primary) hypertension: Secondary | ICD-10-CM | POA: Diagnosis present

## 2019-01-24 DIAGNOSIS — Z66 Do not resuscitate: Secondary | ICD-10-CM | POA: Diagnosis present

## 2019-01-24 DIAGNOSIS — E785 Hyperlipidemia, unspecified: Secondary | ICD-10-CM | POA: Diagnosis present

## 2019-01-24 DIAGNOSIS — Z7189 Other specified counseling: Secondary | ICD-10-CM | POA: Diagnosis not present

## 2019-01-24 DIAGNOSIS — G40909 Epilepsy, unspecified, not intractable, without status epilepticus: Secondary | ICD-10-CM | POA: Diagnosis present

## 2019-01-24 DIAGNOSIS — K581 Irritable bowel syndrome with constipation: Secondary | ICD-10-CM | POA: Diagnosis present

## 2019-01-24 DIAGNOSIS — R112 Nausea with vomiting, unspecified: Secondary | ICD-10-CM | POA: Insufficient documentation

## 2019-01-24 DIAGNOSIS — E119 Type 2 diabetes mellitus without complications: Secondary | ICD-10-CM | POA: Diagnosis present

## 2019-01-24 DIAGNOSIS — S0081XA Abrasion of other part of head, initial encounter: Secondary | ICD-10-CM | POA: Diagnosis not present

## 2019-01-24 DIAGNOSIS — Z781 Physical restraint status: Secondary | ICD-10-CM | POA: Diagnosis not present

## 2019-01-24 DIAGNOSIS — F0391 Unspecified dementia with behavioral disturbance: Secondary | ICD-10-CM | POA: Diagnosis present

## 2019-01-24 DIAGNOSIS — Z7982 Long term (current) use of aspirin: Secondary | ICD-10-CM | POA: Diagnosis not present

## 2019-01-24 DIAGNOSIS — E7220 Disorder of urea cycle metabolism, unspecified: Secondary | ICD-10-CM | POA: Diagnosis present

## 2019-01-24 DIAGNOSIS — G9341 Metabolic encephalopathy: Secondary | ICD-10-CM | POA: Diagnosis present

## 2019-01-24 DIAGNOSIS — Z515 Encounter for palliative care: Secondary | ICD-10-CM | POA: Diagnosis present

## 2019-01-24 DIAGNOSIS — R6520 Severe sepsis without septic shock: Secondary | ICD-10-CM | POA: Diagnosis present

## 2019-01-24 DIAGNOSIS — F0390 Unspecified dementia without behavioral disturbance: Secondary | ICD-10-CM | POA: Diagnosis not present

## 2019-01-24 DIAGNOSIS — L816 Other disorders of diminished melanin formation: Secondary | ICD-10-CM | POA: Diagnosis present

## 2019-01-24 DIAGNOSIS — J9601 Acute respiratory failure with hypoxia: Secondary | ICD-10-CM | POA: Diagnosis present

## 2019-01-24 DIAGNOSIS — Z79899 Other long term (current) drug therapy: Secondary | ICD-10-CM | POA: Diagnosis not present

## 2019-01-24 DIAGNOSIS — F0151 Vascular dementia with behavioral disturbance: Secondary | ICD-10-CM | POA: Diagnosis not present

## 2019-01-24 DIAGNOSIS — A4189 Other specified sepsis: Secondary | ICD-10-CM | POA: Diagnosis present

## 2019-01-24 DIAGNOSIS — Z8673 Personal history of transient ischemic attack (TIA), and cerebral infarction without residual deficits: Secondary | ICD-10-CM | POA: Diagnosis not present

## 2019-01-24 DIAGNOSIS — N179 Acute kidney failure, unspecified: Secondary | ICD-10-CM | POA: Diagnosis present

## 2019-01-24 LAB — URINALYSIS, ROUTINE W REFLEX MICROSCOPIC
Bacteria, UA: NONE SEEN
Bilirubin Urine: NEGATIVE
Glucose, UA: NEGATIVE mg/dL
Ketones, ur: 20 mg/dL — AB
Leukocytes,Ua: NEGATIVE
Nitrite: NEGATIVE
Protein, ur: NEGATIVE mg/dL
Specific Gravity, Urine: 1.005 (ref 1.005–1.030)
pH: 8 (ref 5.0–8.0)

## 2019-01-24 LAB — GLUCOSE, CAPILLARY: Glucose-Capillary: 102 mg/dL — ABNORMAL HIGH (ref 70–99)

## 2019-01-24 LAB — POCT I-STAT EG7
Acid-Base Excess: 6 mmol/L — ABNORMAL HIGH (ref 0.0–2.0)
Bicarbonate: 29.2 mmol/L — ABNORMAL HIGH (ref 20.0–28.0)
Calcium, Ion: 0.93 mmol/L — ABNORMAL LOW (ref 1.15–1.40)
HCT: 40 % (ref 36.0–46.0)
Hemoglobin: 13.6 g/dL (ref 12.0–15.0)
O2 Saturation: 77 %
Potassium: 3.8 mmol/L (ref 3.5–5.1)
Sodium: 136 mmol/L (ref 135–145)
TCO2: 30 mmol/L (ref 22–32)
pCO2, Ven: 35.3 mmHg — ABNORMAL LOW (ref 44.0–60.0)
pH, Ven: 7.526 — ABNORMAL HIGH (ref 7.250–7.430)
pO2, Ven: 36 mmHg (ref 32.0–45.0)

## 2019-01-24 LAB — CBC WITH DIFFERENTIAL/PLATELET
Abs Immature Granulocytes: 0.01 10*3/uL (ref 0.00–0.07)
Basophils Absolute: 0 10*3/uL (ref 0.0–0.1)
Basophils Relative: 0 %
Eosinophils Absolute: 0 10*3/uL (ref 0.0–0.5)
Eosinophils Relative: 0 %
HCT: 41.7 % (ref 36.0–46.0)
Hemoglobin: 12.8 g/dL (ref 12.0–15.0)
Immature Granulocytes: 0 %
Lymphocytes Relative: 22 %
Lymphs Abs: 0.8 10*3/uL (ref 0.7–4.0)
MCH: 28.3 pg (ref 26.0–34.0)
MCHC: 30.7 g/dL (ref 30.0–36.0)
MCV: 92.1 fL (ref 80.0–100.0)
Monocytes Absolute: 0.2 10*3/uL (ref 0.1–1.0)
Monocytes Relative: 6 %
Neutro Abs: 2.4 10*3/uL (ref 1.7–7.7)
Neutrophils Relative %: 72 %
Platelets: 144 10*3/uL — ABNORMAL LOW (ref 150–400)
RBC: 4.53 MIL/uL (ref 3.87–5.11)
RDW: 13.8 % (ref 11.5–15.5)
WBC: 3.4 10*3/uL — ABNORMAL LOW (ref 4.0–10.5)
nRBC: 0 % (ref 0.0–0.2)

## 2019-01-24 LAB — COMPREHENSIVE METABOLIC PANEL
ALT: 35 U/L (ref 0–44)
AST: 54 U/L — ABNORMAL HIGH (ref 15–41)
Albumin: 3.7 g/dL (ref 3.5–5.0)
Alkaline Phosphatase: 73 U/L (ref 38–126)
Anion gap: 10 (ref 5–15)
BUN: 8 mg/dL (ref 8–23)
CO2: 29 mmol/L (ref 22–32)
Calcium: 8.5 mg/dL — ABNORMAL LOW (ref 8.9–10.3)
Chloride: 98 mmol/L (ref 98–111)
Creatinine, Ser: 0.92 mg/dL (ref 0.44–1.00)
GFR calc Af Amer: >60 mL/min (ref >60)
GFR calc non Af Amer: >60 mL/min (ref >60)
Glucose, Bld: 92 mg/dL (ref 70–99)
Potassium: 4.1 mmol/L (ref 3.5–5.1)
Sodium: 137 mmol/L (ref 135–145)
Total Bilirubin: 0.9 mg/dL (ref 0.3–1.2)
Total Protein: 6.9 g/dL (ref 6.5–8.1)

## 2019-01-24 LAB — LACTIC ACID, PLASMA: Lactic Acid, Venous: 1.1 mmol/L (ref 0.5–1.9)

## 2019-01-24 MED ORDER — INSULIN ASPART 100 UNIT/ML ~~LOC~~ SOLN: 0.0000 [IU] | Freq: Every day | SUBCUTANEOUS | Status: DC

## 2019-01-24 MED ORDER — SODIUM CHLORIDE 0.9 % IV SOLN
1.0000 g | Freq: Once | INTRAVENOUS | Status: AC
  Administered 2019-01-24: 1 g via INTRAVENOUS
  Filled 2019-01-24: qty 10

## 2019-01-24 MED ORDER — ONDANSETRON HCL 4 MG/2ML IJ SOLN: 4.0000 mg | Freq: Four times a day (QID) | INTRAMUSCULAR | Status: DC | PRN

## 2019-01-24 MED ORDER — INSULIN ASPART 100 UNIT/ML ~~LOC~~ SOLN: 0.0000 [IU] | Freq: Three times a day (TID) | SUBCUTANEOUS | Status: DC

## 2019-01-24 MED ORDER — SODIUM CHLORIDE 0.9 % IV SOLN: 500.0000 mg | Freq: Once | INTRAVENOUS | Status: DC

## 2019-01-24 MED ORDER — LORAZEPAM 2 MG/ML IJ SOLN
1.0000 mg | Freq: Once | INTRAMUSCULAR | Status: AC
  Administered 2019-01-24: 1 mg via INTRAVENOUS
  Filled 2019-01-24: qty 1

## 2019-01-24 MED ORDER — ENOXAPARIN SODIUM 40 MG/0.4ML ~~LOC~~ SOLN: 40.0000 mg | SUBCUTANEOUS | Status: DC

## 2019-01-24 MED ORDER — ENOXAPARIN SODIUM 40 MG/0.4ML ~~LOC~~ SOLN
40.0000 mg | SUBCUTANEOUS | Status: DC
  Administered 2019-01-25 – 2019-01-29 (×5): 40 mg via SUBCUTANEOUS
  Filled 2019-01-24 (×6): qty 0.4

## 2019-01-24 MED ORDER — SODIUM CHLORIDE 0.9 % IV SOLN
1.0000 g | INTRAVENOUS | Status: DC
  Administered 2019-01-25: 1 g via INTRAVENOUS
  Filled 2019-01-24 (×2): qty 10

## 2019-01-24 MED ORDER — ACETAMINOPHEN 325 MG PO TABS
650.0000 mg | ORAL_TABLET | Freq: Once | ORAL | Status: AC
  Administered 2019-01-24: 650 mg via ORAL
  Filled 2019-01-24: qty 2

## 2019-01-24 MED ORDER — FOLIC ACID 1 MG PO TABS
1.0000 mg | ORAL_TABLET | Freq: Every day | ORAL | Status: DC
  Administered 2019-01-25 – 2019-02-01 (×6): 1 mg via ORAL
  Filled 2019-01-24 (×10): qty 1

## 2019-01-24 MED ORDER — ONDANSETRON HCL 4 MG/2ML IJ SOLN
4.0000 mg | Freq: Once | INTRAMUSCULAR | Status: AC
  Administered 2019-01-24: 4 mg via INTRAVENOUS
  Filled 2019-01-24: qty 2

## 2019-01-24 MED ORDER — AZITHROMYCIN 250 MG PO TABS
500.0000 mg | ORAL_TABLET | ORAL | Status: AC
  Administered 2019-01-25 – 2019-01-29 (×5): 500 mg via ORAL
  Filled 2019-01-24 (×6): qty 2

## 2019-01-24 NOTE — ED Notes (Signed)
Sitter at bedside  Pt sleeping

## 2019-01-24 NOTE — Progress Notes (Signed)
CSW reviewed previous CSW's handoff. CSW attempted both listed numbers and left voicemails on each phone. CSW will follow up as appropriate to support discharge for the patient.  Tenna Delaine, LCSW, LCAS-A Clinical Social Worker II [phone]

## 2019-01-24 NOTE — ED Notes (Signed)
Pt resting comfortably at this time. Staffing aware of need for safety sitter. Unable to provide one at this time. Door is open to room and fall mats placed.

## 2019-01-24 NOTE — ED Provider Notes (Signed)
2:59 PM I was called to see the patient by Dr. Lockie Mola after the patient was found to be febrile to 100.3. Pt seen by me yesterday for a fall, has dementia so can't provide any history, we could not get a hold of any family members yesterday so the pt stayed overnight awaiting dispo. Apparently overnight, it was found out that her husband is in fact admitted with COVID19 and there is no family to take care of the pt. SW was assisting, recommended getting COVID19 testing and if negative then she could be placed in a facility. Now pt with fever of 100.3 oral (100.1 rectally) so I was asked to come evaluate the pt.  I went to see the patient and she is laying on the ground on pads (was placed there to avoid falls), now more confused than she was yesterday, oriented only to person but not time (was oriented to year yesterday) and not to place (same as yesterday). She denies having any complaints, denies coughing/CP/SOB/abd pain/n/v however it's hard to tell whether this is accurate given her dementia/confusion. The confusion appears to be more than yesterday. She is not tachycardic, SpO2 92% on RA, no increased WOB, slightly diminished lung sounds in lower fields bilaterally but hard to tell if this is due to poor inspiratory effort, no rhonchi/rales appreciated, no coughing noted during exam. No abdominal tenderness. Remainder of physical exam as below:  VS: BP (!) 158/59   Pulse 76   Temp 100.3 F (37.9 C) (Oral)   Resp 15   Ht 5\' 5"  (1.651 m)   Wt 69.4 kg   SpO2 92%   BMI 25.46 kg/m   Gen: febrile to 100.3, NAD HEENT: EOMI, MMM, abrasions to R forehead/periorbital area are now scabbed over Resp: no resp distress, slightly diminished lung sounds in lower fields but hard to tell if this is due to poor inspiratory effort, no w/r/r appreciated, no hypoxia or increased WOB, SpO2 92% on RA  CV: RRR, nl s1/s2, no m/r/g, distal pulses intact, no pedal edema  Abd: appearance normal, nondistended, soft,  NTND, no r/g/r MsK: moving all extremities with ease, rolls without difficulty Neuro: oriented to person but not place or time or situation, seems more confused than yesterday. No meningismus.   Given her sick contact with COVID19, and now with low grade fever, will get repeat labs, BCx, lactic, repeat U/A, VBG, and CXR; COVID19 testing already sent. Does not currently meet SIRS/sepsis criteria at this time so will hold off on empiric abx or fluids. Will give tylenol, and reassess once work up is performed.    5:23 PM  CBC w/diff with WBC 3.4 and plt 144, otherwise unremarkable. CMP with marginally elevated AST 54 but otherwise WNL. Lactic WNL. VBG with mildly elevated pH 7.526 but otherwise fairly unremarkable. Apparently pt desatted into the low 90s (possibly lower, but unclear) so now she's on 2L via [place] and sats in the upper 90s. Nursing staff telling me she just vomited. Reassessed pt and pt still denies any complaints, but has vomit in bed next to her. Still without meningismus, still about the same neuro status as previously. Zofran ordered. Anticipate she may need admission. Awaiting U/A and CXR. Will reassess shortly.   Results for orders placed or performed during the hospital encounter of 02/07/19  CBC with Differential  Result Value Ref Range   WBC 2.8 (L) 4.0 - 10.5 K/uL   RBC 4.01 3.87 - 5.11 MIL/uL   Hemoglobin 11.8 (L) 12.0 -  15.0 g/dL   HCT 16.137.5 09.636.0 - 04.546.0 %   MCV 93.5 80.0 - 100.0 fL   MCH 29.4 26.0 - 34.0 pg   MCHC 31.5 30.0 - 36.0 g/dL   RDW 40.914.3 81.111.5 - 91.415.5 %   Platelets 162 150 - 400 K/uL   nRBC 0.0 0.0 - 0.2 %   Neutrophils Relative % 68 %   Neutro Abs 1.9 1.7 - 7.7 K/uL   Lymphocytes Relative 24 %   Lymphs Abs 0.7 0.7 - 4.0 K/uL   Monocytes Relative 8 %   Monocytes Absolute 0.2 0.1 - 1.0 K/uL   Eosinophils Relative 0 %   Eosinophils Absolute 0.0 0.0 - 0.5 K/uL   Basophils Relative 0 %   Basophils Absolute 0.0 0.0 - 0.1 K/uL   Immature Granulocytes 0 %   Abs  Immature Granulocytes 0.01 0.00 - 0.07 K/uL  Basic metabolic panel  Result Value Ref Range   Sodium 138 135 - 145 mmol/L   Potassium 3.4 (L) 3.5 - 5.1 mmol/L   Chloride 102 98 - 111 mmol/L   CO2 27 22 - 32 mmol/L   Glucose, Bld 92 70 - 99 mg/dL   BUN 10 8 - 23 mg/dL   Creatinine, Ser 7.821.05 (H) 0.44 - 1.00 mg/dL   Calcium 8.2 (L) 8.9 - 10.3 mg/dL   GFR calc non Af Amer 54 (L) >60 mL/min   GFR calc Af Amer >60 >60 mL/min   Anion gap 9 5 - 15  Urinalysis, Routine w reflex microscopic  Result Value Ref Range   Color, Urine YELLOW YELLOW   APPearance CLEAR CLEAR   Specific Gravity, Urine 1.021 1.005 - 1.030   pH 6.0 5.0 - 8.0   Glucose, UA NEGATIVE NEGATIVE mg/dL   Hgb urine dipstick SMALL (A) NEGATIVE   Bilirubin Urine NEGATIVE NEGATIVE   Ketones, ur 20 (A) NEGATIVE mg/dL   Protein, ur 30 (A) NEGATIVE mg/dL   Nitrite NEGATIVE NEGATIVE   Leukocytes,Ua NEGATIVE NEGATIVE   RBC / HPF 0-5 0 - 5 RBC/hpf   WBC, UA 0-5 0 - 5 WBC/hpf   Bacteria, UA NONE SEEN NONE SEEN   Squamous Epithelial / LPF 0-5 0 - 5  Ethanol  Result Value Ref Range   Alcohol, Ethyl (B) <10 <10 mg/dL  Lactic acid, plasma  Result Value Ref Range   Lactic Acid, Venous 1.1 0.5 - 1.9 mmol/L  Comprehensive metabolic panel  Result Value Ref Range   Sodium 137 135 - 145 mmol/L   Potassium 4.1 3.5 - 5.1 mmol/L   Chloride 98 98 - 111 mmol/L   CO2 29 22 - 32 mmol/L   Glucose, Bld 92 70 - 99 mg/dL   BUN 8 8 - 23 mg/dL   Creatinine, Ser 9.560.92 0.44 - 1.00 mg/dL   Calcium 8.5 (L) 8.9 - 10.3 mg/dL   Total Protein 6.9 6.5 - 8.1 g/dL   Albumin 3.7 3.5 - 5.0 g/dL   AST 54 (H) 15 - 41 U/L   ALT 35 0 - 44 U/L   Alkaline Phosphatase 73 38 - 126 U/L   Total Bilirubin 0.9 0.3 - 1.2 mg/dL   GFR calc non Af Amer >60 >60 mL/min   GFR calc Af Amer >60 >60 mL/min   Anion gap 10 5 - 15  CBC WITH DIFFERENTIAL  Result Value Ref Range   WBC 3.4 (L) 4.0 - 10.5 K/uL   RBC 4.53 3.87 - 5.11 MIL/uL   Hemoglobin 12.8 12.0 -  15.0 g/dL    HCT 16.1 09.6 - 04.5 %   MCV 92.1 80.0 - 100.0 fL   MCH 28.3 26.0 - 34.0 pg   MCHC 30.7 30.0 - 36.0 g/dL   RDW 40.9 81.1 - 91.4 %   Platelets 144 (L) 150 - 400 K/uL   nRBC 0.0 0.0 - 0.2 %   Neutrophils Relative % 72 %   Neutro Abs 2.4 1.7 - 7.7 K/uL   Lymphocytes Relative 22 %   Lymphs Abs 0.8 0.7 - 4.0 K/uL   Monocytes Relative 6 %   Monocytes Absolute 0.2 0.1 - 1.0 K/uL   Eosinophils Relative 0 %   Eosinophils Absolute 0.0 0.0 - 0.5 K/uL   Basophils Relative 0 %   Basophils Absolute 0.0 0.0 - 0.1 K/uL   Immature Granulocytes 0 %   Abs Immature Granulocytes 0.01 0.00 - 0.07 K/uL  POCT I-Stat EG7  Result Value Ref Range   pH, Ven 7.526 (H) 7.250 - 7.430   pCO2, Ven 35.3 (L) 44.0 - 60.0 mmHg   pO2, Ven 36.0 32.0 - 45.0 mmHg   Bicarbonate 29.2 (H) 20.0 - 28.0 mmol/L   TCO2 30 22 - 32 mmol/L   O2 Saturation 77.0 %   Acid-Base Excess 6.0 (H) 0.0 - 2.0 mmol/L   Sodium 136 135 - 145 mmol/L   Potassium 3.8 3.5 - 5.1 mmol/L   Calcium, Ion 0.93 (L) 1.15 - 1.40 mmol/L   HCT 40.0 36.0 - 46.0 %   Hemoglobin 13.6 12.0 - 15.0 g/dL   Patient temperature HIDE    Sample type VENOUS    Comment NOTIFIED PHYSICIAN     Ct Head Wo Contrast Result Date: 01/13/2019 CLINICAL DATA:  Fall with head injury.  No loss of consciousness. EXAM: CT HEAD WITHOUT CONTRAST CT CERVICAL SPINE WITHOUT CONTRAST TECHNIQUE: Multidetector CT imaging of the head and cervical spine was performed following the standard protocol without intravenous contrast. Multiplanar CT image reconstructions of the cervical spine were also generated. COMPARISON:  CT 12/27/2018 and 12/24/2018. Cervical spine CT 12/24/2018. FINDINGS: CT HEAD FINDINGS Brain: There is no evidence of acute intracranial hemorrhage, mass lesion, brain edema or extra-axial fluid collection. Extensive chronic encephalomalacia is again noted in the left parietooccipital lobe with associated ventriculomegaly and patchy periventricular white matter disease. No  evidence of acute infarct or hydrocephalus. Vascular: Left carotid siphon aneurysm coil again noted. No hyperdense vessel. Skull: Negative for fracture or focal lesion. Sinuses/Orbits: The visualized paranasal sinuses, mastoid air cells and middle ears are clear. No orbital abnormalities are seen. Other: None. CT CERVICAL SPINE FINDINGS Alignment: Mild convex left scoliosis.  Normal sagittal alignment. 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 No evidence of acute fracture or traumatic subluxation. Soft tissues and spinal canal: No prevertebral fluid or swelling. No visible canal hematoma. Disc levels: Multilevel spondylosis again noted, greatest at C5-6 and C6-7. No high-grade spinal stenosis identified. There are scattered facet degenerative changes which are worst on the left at C4-5. Upper chest: Unremarkable. Other: None. IMPRESSION: 1. No acute intracranial or calvarial findings. 2. Stable chronic left parietooccipital encephalomalacia, small vessel ischemic changes and ventriculomegaly. 3. No evidence of cervical spine fracture, traumatic subluxation or static signs of instability. Electronically Signed   By: Carey Bullocks M.D.   On: 01/29/2019 16:27    Ct Cervical Spine Wo Contrast Result Date: 02/07/2019 CLINICAL DATA:  Fall with head injury.  No loss of consciousness. EXAM: CT HEAD WITHOUT CONTRAST CT CERVICAL  SPINE WITHOUT CONTRAST TECHNIQUE: Multidetector CT imaging of the head and cervical spine was performed following the standard protocol without intravenous contrast. Multiplanar CT image reconstructions of the cervical spine were also generated. COMPARISON:  CT 12/27/2018 and 12/24/2018. Cervical spine CT 12/24/2018. FINDINGS: CT HEAD FINDINGS Brain: There is no evidence of acute intracranial hemorrhage, mass lesion, brain edema or extra-axial fluid collection. Extensive chronic encephalomalacia is again noted in the left parietooccipital lobe with  associated ventriculomegaly and patchy periventricular white matter disease. No evidence of acute infarct or hydrocephalus. Vascular: Left carotid siphon aneurysm coil again noted. No hyperdense vessel. Skull: Negative for fracture or focal lesion. Sinuses/Orbits: The visualized paranasal sinuses, mastoid air cells and middle ears are clear. No orbital abnormalities are seen. Other: None. CT CERVICAL SPINE FINDINGS Alignment: Mild convex left scoliosis.  Normal sagittal alignment. 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 No evidence of acute fracture or traumatic subluxation. Soft tissues and spinal canal: No prevertebral fluid or swelling. No visible canal hematoma. Disc levels: Multilevel spondylosis again noted, greatest at C5-6 and C6-7. No high-grade spinal stenosis identified. There are scattered facet degenerative changes which are worst on the left at C4-5. Upper chest: Unremarkable. Other: None. IMPRESSION: 1. No acute intracranial or calvarial findings. 2. Stable chronic left parietooccipital encephalomalacia, small vessel ischemic changes and ventriculomegaly. 3. No evidence of cervical spine fracture, traumatic subluxation or static signs of instability. Electronically Signed   By: Carey Bullocks M.D.   On: 02/11/2019 16:27    5:42 PM  CXR showing mild bibasilar opacities concerning for subsegmental atelectasis, scarring, or possibly infiltrates. Given that she's now having some oxygen requirement, will treat for PNA. Suspect possible COVID19 given that pt has a sick contact with this. Will proceed with admission.   Dg Chest Port 1 View Result Date: 01/24/2019 CLINICAL DATA:  Fever. EXAM: PORTABLE CHEST 1 VIEW COMPARISON:  Radiographs of December 27, 2018. FINDINGS: The heart size and mediastinal contours are within normal limits. No pneumothorax or pleural effusion is noted. Stable bibasilar opacities are noted concerning for subsegmental atelectasis,  scarring or possibly infiltrates. The visualized skeletal structures are unremarkable. IMPRESSION: Stable mild bibasilar opacities are noted concerning for subsegmental atelectasis, scarring or possibly infiltrates. Aortic Atherosclerosis (ICD10-I70.0). Electronically Signed   By: Lupita Raider, M.D.   On: 01/24/2019 17:35    6:02 PM Dr[facility] Criselda Peaches of [facility] returning page and will admit. Holding orders to be placed by admitting team. Please see their notes for further documentation of care. I appreciate their help with this pleasant pt's care. Pt stable at time of admission.    Impression:    ICD-10-CM   1. Fall, initial encounter W19.XXXA   2. Abrasion of face, initial encounter S00.81XA   3. Nausea and vomiting in adult R11.2   4. Hypoxia R09.02   5. Dementia without behavioral disturbance, unspecified dementia type (HCC) F03.90   6. Community acquired pneumonia, unspecified laterality J18.9   7. Fever, unspecified fever cause [address] [place], [place] 01/24/19 1802    Tilden Fossa, MD 01/25/19 [phone]

## 2019-01-24 NOTE — ED Provider Notes (Signed)
Patient seen by social work.  They finally have located her husband who is currently admitted to the hospital for coronavirus.  There is no other family for this patient.  She is unable to safely live at home by herself.  Social work recommends coronavirus testing.  If she test negative she will be able to get her to a facility.  Patient currently stable right now.   Virgina Norfolk, DO 01/24/19 [phone]

## 2019-01-24 NOTE — ED Notes (Signed)
Safety sitter ay the bedside

## 2019-01-24 NOTE — Progress Notes (Signed)
CSW reached patient's sister-in-law. Per sister-in-law there was no additional family in the [place] area and she was attending to a family member's passing in [place]. CSW noted per her report the spouse was hospitalized for COVID-19. CSW informed patient's ED RN regarding it. CSW noted patient likely had an unprotected exposure and disposition is unclear as patient is unsafe to return home as she will likely have difficulties taking care of herself. Placement for respite care in a SNF may be a possibility depending upon availability of beds. CSW informed ED RN that most of the facilities in [place] will require a negative test result and if positive will need to be re-tested when appropriate to verify COVID-19 negative. CSW will continue to follow as needed.  Tenna Delaine, LCSW, LCAS-A Clinical Social Worker II [phone]

## 2019-01-24 NOTE — ED Notes (Signed)
Pt needs telepsy order for the floor

## 2019-01-24 NOTE — Progress Notes (Signed)
CSW received a call from [employer] at approx 5:30pm stating [employer] had arrived a pt's home and attempted to speak to pt's husband but pt's husband did not seem to be at home, or answering the dor, although there was a vehicle or vehicle's parked there and pt's home was unlocked with the front door slightly ajar.  When CSW spoke to Patent examiner, law enforcement was waiting for a supervisor to arrive in order to make a decision about whether to enter the pt's husband's home to check on pt's husband (age approx 46?) for pt's husband's safety.  Please reconsult if future social work needs arise.  CSW signing off, as social work intervention is no longer needed.  Angela Pea. Aunica Dauphinee, LCSW, LCAS, CSI Transitions of Care Clinical Social Worker Care Coordination Department Ph: [phone]

## 2019-01-24 NOTE — ED Notes (Signed)
Report called to danielle rn   

## 2019-01-24 NOTE — ED Notes (Signed)
Attempted to give medications to pt. Pt was screaming to give her pills. Yet when we attempted to give them she refused saying those are not her pills. Several attempts made including with applesauce with no success.  Pt covering her mouth with gown and speaking about God. Pt agitated at this time.  COVID swab was collected and sent.

## 2019-01-24 NOTE — ED Notes (Signed)
Ordered breakfast tray  

## 2019-01-24 NOTE — ED Notes (Addendum)
Spoke to [employer] from Social work. Joey notified that pts husband is hospitalized here at [facility] on 2W with positive COVID. Pt is unable to be placed in any type of facility until she is tested with negative result since she has had direct contact with her husband. There is no family locally which can assume responsibility for the pt. She is unable to manage her own care. MD notified and orderd are being placed to test pt. Consulting civil engineer also notified. Pt placed on droplet precautions until COVID can be ruled out.

## 2019-01-24 NOTE — H&P (Addendum)
History and Physical    Angela Fields WUJ:[id number] DOB: Feb 27, 1951 DOA: 11-Feb-2019  PCP: Esperanza Richters, PA-C Patient coming from: Home  Chief Complaint: Presenting after a fall.   HPI: Angela Fields is a 68 y.o. female with medical history significant of Dementia, DM2, anxiety, seizure, h/o stroke who presents after she was seen to have fallen by a bystander and presented yesterday for abrasions around her head.  She had a CT head and neck at the time which did not show any acute issues.  Unfortunately, no family was able to be found and the patient has remained in the ED for the last 24 hours.  History obtained by chart review and discussion with EDP.  Patient has severe dementia based on review of last PCP visit.  She answered that she was at home and that she felt well.  She has had issues with redirection and now has a Recruitment consultant in place for redirection.  Her husband is apparently admitted in the hospital for COVID-19. Apparently there is no other family in the area, per CSW notes.  I spoke with [name], patient's sister in law and confirmed this information.   In the morning, the patient had a worsening of symptoms with more confusion, desaturation, temperature of 100.62F and CXR changes, she will be tested for COVID-19 and admitted for further work up.    ED Course: In the ED, she was initially evaluated for her fall, however, no family could be found so she remained overnight.  She had some worsening of her symptoms and was subsequently found to be leukopenic, along with a temperature of 100.3.  Apparently her husband is currently admitted and is + for COVID-19, so there is a strong chance she was exposed as well.   Review of Systems: As per HPI otherwise 10 point review was unable to be obtained due to severe dementia.    Past Medical History:  Diagnosis Date   Anxiety    Dementia (HCC)    Diabetes mellitus without complication (HCC)    Hyperlipidemia    Hypertension     Mood disorder (HCC)    Rheumatic fever    Seizures (HCC)    Sleep apnea    Stroke [facility])     History reviewed. No pertinent surgical history.   reports that she has quit smoking. She has never used smokeless tobacco. She reports that she does not drink alcohol or use drugs.  No Known Allergies  Family History  Problem Relation Age of Onset   Other Mother        "blood infection"   Heart attack Father    These were reviewed based on last PCP visit.  Patient does not know the names of her medications.  Prior to Admission medications   Medication Sig Start Date End Date Taking? Authorizing Provider  clonazePAM (KLONOPIN) 0.5 MG tablet Take 1 tablet (0.5 mg total) by mouth 2 (two) times daily as needed for anxiety. Fill when due. 12/29/18  Yes Saguier, Ramon Dredge, PA-C  divalproex (DEPAKOTE ER) 500 MG 24 hr tablet Take 1 tablet (500 mg total) by mouth at bedtime. 12/16/18  Yes Levert Feinstein, MD  donepezil (ARICEPT) 10 MG tablet Take 10 mg by mouth daily. Take with food 09/24/18  Yes [provider]  felodipine (PLENDIL) 2.5 MG 24 hr tablet Take 2.5 mg by mouth daily. 12/20/18  Yes [provider]  folic acid (FOLVITE) 1 MG tablet Take 1 mg by mouth daily. 03/07/18  Yes [provider]  LamoTRIgine 100 MG TB24 24 hour tablet Take 300 mg by mouth at bedtime. 12/25/18  Yes [provider]  linaclotide Karlene Einstein) 72 MCG capsule Take 1 capsule (72 mcg total) by mouth daily before breakfast. 12/29/18  Yes Saguier, Ramon Dredge, PA-C  potassium chloride (MICRO-K) 10 MEQ CR capsule TAKE 1 CAPSULE BY MOUTH EVERY DAY Patient taking differently: Take 10 mEq by mouth daily.  12/18/18  Yes Saguier, Ramon Dredge, PA-C  pravastatin (PRAVACHOL) 10 MG tablet TAKE 1 TABLET BY MOUTH EVERYDAY AT BEDTIME Patient taking differently: Take 10 mg by mouth at bedtime.  12/18/18  Yes Saguier, Ramon Dredge, PA-C  QUEtiapine (SEROQUEL) 25 MG tablet Take 25 mg by mouth 2 (two) times daily.  02/13/18  Yes  [provider]  acetaminophen (TYLENOL 8 HOUR ARTHRITIS PAIN) 650 MG CR tablet Take 1 tablet (650 mg total) by mouth every 8 (eight) hours as needed for pain. 11/07/17   Charm Rings, NP  aspirin EC 81 MG EC tablet Take 1 tablet (81 mg total) by mouth daily. 08/05/17   Osvaldo Shipper, MD  felodipine (PLENDIL) 5 MG 24 hr tablet Take 1 tablet (5 mg total) by mouth daily. Patient not taking: Reported on 01/24/2019 12/08/18   Saguier, Ramon Dredge, PA-C  lamoTRIgine (LAMICTAL) 100 MG tablet Take 1.5 tablets (150 mg total) by mouth 2 (two) times daily. Patient not taking: Reported on 01/24/2019 12/28/18   Charm Rings, NP  linaclotide Karlene Einstein) 72 MCG capsule Take 1 capsule (72 mcg total) by mouth daily before breakfast. Patient not taking: Reported on 01/24/2019 09/15/18   Saguier, Ramon Dredge, PA-C  VIMPAT 150 MG TABS Take 150 mg by mouth 2 (two) times daily. 11/23/18   [provider]  white petrolatum ointment Apply 1 application topically 3 (three) times daily as needed for dry skin.    [provider]    Physical Exam: Constitutional: NAD, calm, comfortable Vitals:   01/24/19 1448 01/24/19 1530 01/24/19 1708 01/24/19 1727  BP: (!) 158/59 (!) 158/72  137/61  Pulse: 76 64 79 61  Resp: Temp:      TempSrc:      SpO2: 92% 96% 95% 99%  Weight:      Height:       Eyes:  lids and conjunctivae normal ENMT: Mucous membranes are moist. Poor dentition, Russell in place Neck: normal, supple Respiratory: Breathing comfortably, no increased WOB.  No audible wheezing.  Cardiovascular: Regular rate, normal rhythm, no palpable chest pain.  Abdomen: NT, ND, no masses palpated Musculoskeletal: no clubbing / cyanosis. Normal muscle tone for age.  Skin: no rashes, lesions, ulcers on exposed skin Neurologic: Moving easily in bed, normal grip strength, able to follow most commands with ease.  Psychiatric: Alert, oriented only to her name.  Could not tell me the year and stated she  was at home.     Labs on Admission: I have personally reviewed following labs and imaging studies  CBC: Recent Labs  Lab 2019-02-05 1506 01/24/19 1521 01/24/19 1535  WBC 2.8* 3.4*  --   NEUTROABS 1.9 2.4  --   HGB 11.8* 12.8 13.6  HCT 37.5 41.7 40.0  MCV 93.5 92.1  --   PLT 162 144*  --    Basic Metabolic Panel: Recent Labs  Lab 2019/02/05 1506 01/24/19 1521 01/24/19 1535  NA 138 137 136  K 3.4* 4.1 3.8  CL 102 98  --   CO2 27 29  --   GLUCOSE  92 92  --   BUN 10 8  --   CREATININE 1.05* 0.92  --   CALCIUM 8.2* 8.5*  --    GFR: Estimated Creatinine Clearance: 55.7 mL/min (by C-G formula based on SCr of 0.92 mg/dL). Liver Function Tests: Recent Labs  Lab 01/24/19 1521  AST 54*  ALT 35  ALKPHOS 73  BILITOT 0.9  PROT 6.9  ALBUMIN 3.7   No results for input(s): LIPASE, AMYLASE in the last 168 hours. No results for input(s): AMMONIA in the last 168 hours. Coagulation Profile: No results for input(s): INR, PROTIME in the last 168 hours. Cardiac Enzymes: No results for input(s): CKTOTAL, CKMB, CKMBINDEX, TROPONINI in the last 168 hours. BNP (last 3 results) No results for input(s): PROBNP in the last 8760 hours. HbA1C: No results for input(s): HGBA1C in the last 72 hours. CBG: No results for input(s): GLUCAP in the last 168 hours. Lipid Profile: No results for input(s): CHOL, HDL, LDLCALC, TRIG, CHOLHDL, LDLDIRECT in the last 72 hours. Thyroid Function Tests: No results for input(s): TSH, T4TOTAL, FREET4, T3FREE, THYROIDAB in the last 72 hours. Anemia Panel: No results for input(s): VITAMINB12, FOLATE, FERRITIN, TIBC, IRON, RETICCTPCT in the last 72 hours. Urine analysis:    Component Value Date/Time   COLORURINE YELLOW 01/30/2019 1521   APPEARANCEUR CLEAR 01/17/2019 1521   LABSPEC 1.021 01/22/2019 1521   PHURINE 6.0 01/21/2019 1521   GLUCOSEU NEGATIVE 01/16/2019 1521   GLUCOSEU NEGATIVE 09/02/2018 1042   HGBUR SMALL (A) 01/28/2019 1521   BILIRUBINUR  NEGATIVE 01/27/2019 1521   BILIRUBINUR Negative 09/24/2018 1010   KETONESUR 20 (A) 02/10/2019 1521   PROTEINUR 30 (A) 01/28/2019 1521   UROBILINOGEN negative (A) 09/24/2018 1010   UROBILINOGEN 0.2 09/02/2018 1042   NITRITE NEGATIVE 02/01/2019 1521   LEUKOCYTESUR NEGATIVE 01/28/2019 1521    Radiological Exams on Admission: Ct Head Wo Contrast  Result Date: 02/04/2019 CLINICAL DATA:  Fall with head injury.  No loss of consciousness. EXAM: CT HEAD WITHOUT CONTRAST CT CERVICAL SPINE WITHOUT CONTRAST TECHNIQUE: Multidetector CT imaging of the head and cervical spine was performed following the standard protocol without intravenous contrast. Multiplanar CT image reconstructions of the cervical spine were also generated. COMPARISON:  CT 12/27/2018 and 12/24/2018. Cervical spine CT 12/24/2018. FINDINGS: CT HEAD FINDINGS Brain: There is no evidence of acute intracranial hemorrhage, mass lesion, brain edema or extra-axial fluid collection. Extensive chronic encephalomalacia is again noted in the left parietooccipital lobe with associated ventriculomegaly and patchy periventricular white matter disease. No evidence of acute infarct or hydrocephalus. Vascular: Left carotid siphon aneurysm coil again noted. No hyperdense vessel. Skull: Negative for fracture or focal lesion. Sinuses/Orbits: The visualized paranasal sinuses, mastoid air cells and middle ears are clear. No orbital abnormalities are seen. Other: None. CT CERVICAL SPINE FINDINGS Alignment: Mild convex left scoliosis.  Normal sagittal alignment. 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 No evidence of acute fracture or traumatic subluxation. Soft tissues and spinal canal: No prevertebral fluid or swelling. No visible canal hematoma. Disc levels: Multilevel spondylosis again noted, greatest at C5-6 and C6-7. No high-grade spinal stenosis identified. There are scattered facet degenerative changes which are worst on  the left at C4-5. Upper chest: Unremarkable. Other: None. IMPRESSION: 1. No acute intracranial or calvarial findings. 2. Stable chronic left parietooccipital encephalomalacia, small vessel ischemic changes and ventriculomegaly. 3. No evidence of cervical spine fracture, traumatic subluxation or static signs of instability. Electronically Signed   By: Hilarie Fredrickson.D.  On: 02/01/2019 16:27   Ct Cervical Spine Wo Contrast  Result Date: 02/01/2019 CLINICAL DATA:  Fall with head injury.  No loss of consciousness. EXAM: CT HEAD WITHOUT CONTRAST CT CERVICAL SPINE WITHOUT CONTRAST TECHNIQUE: Multidetector CT imaging of the head and cervical spine was performed following the standard protocol without intravenous contrast. Multiplanar CT image reconstructions of the cervical spine were also generated. COMPARISON:  CT 12/27/2018 and 12/24/2018. Cervical spine CT 12/24/2018. FINDINGS: CT HEAD FINDINGS Brain: There is no evidence of acute intracranial hemorrhage, mass lesion, brain edema or extra-axial fluid collection. Extensive chronic encephalomalacia is again noted in the left parietooccipital lobe with associated ventriculomegaly and patchy periventricular white matter disease. No evidence of acute infarct or hydrocephalus. Vascular: Left carotid siphon aneurysm coil again noted. No hyperdense vessel. Skull: Negative for fracture or focal lesion. Sinuses/Orbits: The visualized paranasal sinuses, mastoid air cells and middle ears are clear. No orbital abnormalities are seen. Other: None. CT CERVICAL SPINE FINDINGS Alignment: Mild convex left scoliosis.  Normal sagittal alignment. 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 No evidence of acute fracture or traumatic subluxation. Soft tissues and spinal canal: No prevertebral fluid or swelling. No visible canal hematoma. Disc levels: Multilevel spondylosis again noted, greatest at C5-6 and C6-7. No high-grade spinal stenosis  identified. There are scattered facet degenerative changes which are worst on the left at C4-5. Upper chest: Unremarkable. Other: None. IMPRESSION: 1. No acute intracranial or calvarial findings. 2. Stable chronic left parietooccipital encephalomalacia, small vessel ischemic changes and ventriculomegaly. 3. No evidence of cervical spine fracture, traumatic subluxation or static signs of instability. Electronically Signed   By: Carey Bullocks M.D.   On: 02/07/2019 16:27   Dg Chest Port 1 View  Result Date: 01/24/2019 CLINICAL DATA:  Fever. EXAM: PORTABLE CHEST 1 VIEW COMPARISON:  Radiographs of December 27, 2018. FINDINGS: The heart size and mediastinal contours are within normal limits. No pneumothorax or pleural effusion is noted. Stable bibasilar opacities are noted concerning for subsegmental atelectasis, scarring or possibly infiltrates. The visualized skeletal structures are unremarkable. IMPRESSION: Stable mild bibasilar opacities are noted concerning for subsegmental atelectasis, scarring or possibly infiltrates. Aortic Atherosclerosis (ICD10-I70.0). Electronically Signed   By: Lupita Raider, M.D.   On: 01/24/2019 17:35    EKG: Independently reviewed. NSR, no TW changes  Assessment/Plan Leukopenia, elevated temperature, new oxygen requirement, exposure to known COVID-19 patient - Also with mild transaminitis - COVID-19 tested, pending results - Oxygen to keep sats > 92% - Avoid nebulizers, other aerosolizing medications - Contact and droplet precautions  - CAP coverage with Rocephin and Azithromycin until COVID testing returns  Nausea and vomiting - Zofran PRN    Dementia with behavioral disturbance - She is on seroquel which will be continued - She has a Recruitment consultant in the ED, which will be continued  Hypertension - Continue home felodipine    Hyperlipidemia - Continue home pravastatin    Seizure d/o - Not completely clear based on pharmacy review which ones she is taking.   She has been continued ion lamotrigine, vimpat and depakote - Seizure precautions      DVT prophylaxis: Lovenox  Code Status: Full based on previous review  Family Communication: Husband, Johnesha Acheampong, is ill on 2W16.  I spoke with him by phone and patients Sister in [place] this evening to update them.   Disposition Plan: Admit for oxygen and COVID testing.  She is a PUI, low risk for transmission  Consults  called: None, consider ID if needed  Admission status: INP, Med surg    Debe CoderEmily Joson Sapp MD [facility]  If 7PM-7AM, please contact night-coverage [email or web address]   01/24/2019, 6:39 PM

## 2019-01-24 NOTE — ED Notes (Signed)
Due to pt inability to stay in bed, pt now with 1 on 1 sitter for pt safety.

## 2019-01-24 NOTE — ED Notes (Signed)
A daughter has called and talked to the admitting doctor around 1845 [place] of [place]  Another attempt to get the admitting doctor to call me back

## 2019-01-25 DIAGNOSIS — S0081XA Abrasion of other part of head, initial encounter: Secondary | ICD-10-CM

## 2019-01-25 DIAGNOSIS — J9601 Acute respiratory failure with hypoxia: Secondary | ICD-10-CM

## 2019-01-25 DIAGNOSIS — Z515 Encounter for palliative care: Secondary | ICD-10-CM

## 2019-01-25 DIAGNOSIS — J189 Pneumonia, unspecified organism: Secondary | ICD-10-CM

## 2019-01-25 DIAGNOSIS — F0151 Vascular dementia with behavioral disturbance: Secondary | ICD-10-CM

## 2019-01-25 DIAGNOSIS — Z7189 Other specified counseling: Secondary | ICD-10-CM

## 2019-01-25 LAB — CBC
HCT: 41.9 % (ref 36.0–46.0)
Hemoglobin: 13.3 g/dL (ref 12.0–15.0)
MCH: 28.8 pg (ref 26.0–34.0)
MCHC: 31.7 g/dL (ref 30.0–36.0)
MCV: 90.7 fL (ref 80.0–100.0)
Platelets: 126 10*3/uL — ABNORMAL LOW (ref 150–400)
RBC: 4.62 MIL/uL (ref 3.87–5.11)
RDW: 13.7 % (ref 11.5–15.5)
WBC: 3.6 10*3/uL — ABNORMAL LOW (ref 4.0–10.5)
nRBC: 0 % (ref 0.0–0.2)

## 2019-01-25 LAB — D-DIMER, QUANTITATIVE: D-Dimer, Quant: 1.59 ug/mL-FEU — ABNORMAL HIGH (ref 0.00–0.50)

## 2019-01-25 LAB — FERRITIN: Ferritin: 154 ng/mL (ref 11–307)

## 2019-01-25 LAB — BASIC METABOLIC PANEL
Anion gap: 16 — ABNORMAL HIGH (ref 5–15)
BUN: 9 mg/dL (ref 8–23)
CO2: 23 mmol/L (ref 22–32)
Calcium: 8.6 mg/dL — ABNORMAL LOW (ref 8.9–10.3)
Chloride: 99 mmol/L (ref 98–111)
Creatinine, Ser: 1.04 mg/dL — ABNORMAL HIGH (ref 0.44–1.00)
GFR calc Af Amer: >60 mL/min (ref >60)
GFR calc non Af Amer: 54 mL/min — ABNORMAL LOW (ref >60)
Glucose, Bld: 94 mg/dL (ref 70–99)
Potassium: 3.6 mmol/L (ref 3.5–5.1)
Sodium: 138 mmol/L (ref 135–145)

## 2019-01-25 LAB — SARS CORONAVIRUS 2 BY RT PCR (HOSPITAL ORDER, PERFORMED IN ~~LOC~~ HOSPITAL LAB): SARS Coronavirus 2: POSITIVE — AB

## 2019-01-25 LAB — C-REACTIVE PROTEIN: CRP: 7.5 mg/dL — ABNORMAL HIGH (ref <1.0)

## 2019-01-25 LAB — GLUCOSE, CAPILLARY
Glucose-Capillary: 79 mg/dL (ref 70–99)
Glucose-Capillary: 88 mg/dL (ref 70–99)
Glucose-Capillary: 85 mg/dL (ref 70–99)
Glucose-Capillary: 103 mg/dL — ABNORMAL HIGH (ref 70–99)

## 2019-01-25 LAB — STREP PNEUMONIAE URINARY ANTIGEN: Strep Pneumo Urinary Antigen: NEGATIVE

## 2019-01-25 LAB — HIV ANTIBODY (ROUTINE TESTING W REFLEX): HIV Screen 4th Generation wRfx: NONREACTIVE

## 2019-01-25 LAB — LACTATE DEHYDROGENASE: LDH: 645 U/L — ABNORMAL HIGH (ref 98–192)

## 2019-01-25 LAB — PROCALCITONIN: Procalcitonin: 0.6 ng/mL

## 2019-01-25 MED ORDER — HYDROXYCHLOROQUINE SULFATE 200 MG PO TABS
400.0000 mg | ORAL_TABLET | Freq: Two times a day (BID) | ORAL | Status: AC
  Administered 2019-01-25 (×2): 400 mg via ORAL
  Filled 2019-01-25 (×2): qty 2

## 2019-01-25 MED ORDER — LORAZEPAM 2 MG/ML IJ SOLN
1.0000 mg | INTRAMUSCULAR | Status: DC | PRN
  Administered 2019-01-25 – 2019-01-26 (×2): 1 mg via INTRAVENOUS
  Filled 2019-01-25 (×3): qty 1

## 2019-01-25 MED ORDER — HYDROXYCHLOROQUINE SULFATE 200 MG PO TABS
200.0000 mg | ORAL_TABLET | Freq: Two times a day (BID) | ORAL | Status: DC
  Administered 2019-01-26 – 2019-01-29 (×7): 200 mg via ORAL
  Filled 2019-01-25 (×8): qty 1

## 2019-01-25 NOTE — Progress Notes (Addendum)
[facility]                                                                              Patient Demographics  Angela Fields, is a 68 y.o. female, DOB - October 16, 1950, ZOX:[id number]  Admit date - 01/17/2019   Admitting Physician Inez Catalina, MD  Outpatient Primary MD for the patient is Saguier, Kateri Mc  Outpatient specialists:   LOS - 1  days   Medical records reviewed and are as summarized below:    Chief Complaint  Patient presents with   Fall       Brief summary   Patient is a 68 year old female with severe dementia, diabetes mellitus type 2, anxiety, history of seizures, stroke presented after she was found fallen by a bystander and presented to ED on 4/11 for abrasions around her head.  CT head and neck at that time did not show acute issues.  Unfortunately no family was able to be found and patient remained in ED for 24 hours.  In ED, patient was found to have worsening of the symptoms with more confusion, hypoxia, temperature 100.3 F and chest x-ray changes, patient was admitted for COVID 19 rule out.  Unfortunately her husband is also admitted in the hospital with the COVID-19 (positive) pneumonia.   Assessment & Plan    Principal Problem:   Acute respiratory failure with hypoxia (HCC) suspected COVID-19 -Patient found to have fever, hypoxia, new oxygen requirement, chest x-ray changes with leukopenia, mildly elevated LFTs, exposure (husband positive for covid pneumonia). -COVID-19 results pending, continue albuterol MDI, IV Rocephin, Zithromax -Palliative consult given severe dementia, high risk factors for COVID-19  Active Problems:   Hypertension -BP currently stable, continue felodipine    Hyperlipidemia -Continue pravastatin    Seizure (HCC) -Continue seizure precautions, continue lamotrigine, Vimpat and Depakote    Dementia with behavioral disturbance (HCC) -Continue telemetry sitter, Seroquel palliative consult   Code Status:  Full code, palliative goals of care pending DVT Prophylaxis:  Lovenox  Family Communication: Unfortunately, patient's husband is also admitted with covid pneumonia.  Discussed in detail with the patient's sister-in-law as well patient is admitted with covid pneumonia and acute respiratory failure.  Discussed goals of care with patient's SIL, she states that Angela Fields had poor quality of life with dementia, would have DNR if impending respiratory failure.  Discussed with palliative medicine, ethical dilemma given patient's husband is also admitted with acute respiratory failure.  Will await palliative medicine recommendations  Disposition Plan:   Time Spent in minutes   35 minutes  Procedures:  None  Consultants:   None  Antimicrobials:   Anti-infectives (From admission, onward)   Start     Dose/Rate Route Frequency Ordered Stop   01/25/19 2200  cefTRIAXone (ROCEPHIN) 1 g in sodium chloride 0.9 % 100 mL IVPB     1 g 200 mL/hr over 30 Minutes Intravenous Every 24 hours 01/24/19 2126 02/01/19 2159   01/24/19 2200  azithromycin (ZITHROMAX) tablet 500 mg     500 mg Oral Every 24 hours 01/24/19 2126 01/31/19 2159   01/24/19 1745  cefTRIAXone (ROCEPHIN) 1 g in sodium chloride 0.9 %  100 mL IVPB     1 g 200 mL/hr over 30 Minutes Intravenous  Once 01/24/19 1740 01/24/19 2059   01/24/19 1745  azithromycin (ZITHROMAX) 500 mg in sodium chloride 0.9 % 250 mL IVPB  Status:  Discontinued     500 mg 250 mL/hr over 60 Minutes Intravenous  Once 01/24/19 1740 01/24/19 2126         Medications  Scheduled Meds:  aspirin EC  81 mg Oral Daily   azithromycin  500 mg Oral Q24H   divalproex  500 mg Oral QHS   donepezil  10 mg Oral Daily   enoxaparin (LOVENOX) injection  40 mg Subcutaneous Q24H   felodipine  5 mg Oral Daily   folic acid  1 mg Oral Daily   insulin aspart  0-5 Units Subcutaneous QHS   insulin aspart  0-9 Units Subcutaneous TID WC   lacosamide  150 mg Oral BID   lamoTRIgine   150 mg Oral BID   linaclotide  72 mcg Oral QAC breakfast   pravastatin  10 mg Oral QHS   QUEtiapine  25 mg Oral BID   Continuous Infusions:  cefTRIAXone (ROCEPHIN)  IV     PRN Meds:.LORazepam, ondansetron (ZOFRAN) IV      Subjective:   Angela Fields was seen and examined today.  Unable to obtain review of system from the patient, severe dementia with behavioral issues, yelling  Objective:   Vitals:   01/24/19 1930 01/24/19 2000 01/24/19 2137 01/25/19 0856  BP: (!) 166/77 (!) 161/82 (!) 108/92 (!) 146/66  Pulse: 74 84 73 70  Resp: Temp:   98.3 F (36.8 C) (!) 100.8 F (38.2 C)  TempSrc:   Oral Oral  SpO2: 98% 95% 95%   Weight:   68.9 kg   Height:        Intake/Output Summary (Last 24 hours) at 01/25/2019 1122 Last data filed at 01/25/2019 0930 Gross per 24 hour  Intake 120 ml  Output 950 ml  Net -830 ml     Wt Readings from Last 3 Encounters:  01/24/19 68.9 kg  12/29/18 76.7 kg  12/27/18 69.4 kg     Exam  General: Confused, yelling  Eyes:   HEENT:  Atraumatic, normocephalic  Cardiovascular: S1 S2 auscultated,  Regular rate and rhythm.  Respiratory: Appears CTA on auscultation anteriorly  Gastrointestinal: Soft, nontender, nondistended, + bowel sounds  Ext: no pedal edema bilaterally  Neuro: Able to assess  Musculoskeletal: No digital cyanosis, clubbing  Skin: No rashes  Psych: Confused   Data Reviewed:  I have personally reviewed following labs and imaging studies  Micro Results No results found for this or any previous visit (from the past 240 hour(s)).  Radiology Reports Dg Chest 2 View  Result Date: 12/27/2018 CLINICAL DATA:  Acute chest pain EXAM: CHEST - 2 VIEW COMPARISON:  03/15/2018 and prior radiographs FINDINGS: The cardiomediastinal silhouette is unremarkable. Mild increased opacity at the MEDIAL RIGHT lung base noted. There is no evidence of pulmonary edema, suspicious pulmonary nodule/mass, pleural effusion,  or pneumothorax. No acute bony abnormalities are identified. IMPRESSION: Mild increased opacity at the MEDIAL RIGHT lung base which may represent atelectasis or airspace disease/pneumonia. Electronically Signed   By: Harmon Pier M.D.   On: 12/27/2018 08:53   Ct Head Wo Contrast  Result Date: 2019-02-09 CLINICAL DATA:  Fall with head injury.  No loss of consciousness. EXAM: CT HEAD WITHOUT CONTRAST CT CERVICAL SPINE WITHOUT CONTRAST TECHNIQUE: Multidetector CT imaging  of the head and cervical spine was performed following the standard protocol without intravenous contrast. Multiplanar CT image reconstructions of the cervical spine were also generated. COMPARISON:  CT 12/27/2018 and 12/24/2018. Cervical spine CT 12/24/2018. FINDINGS: CT HEAD FINDINGS Brain: There is no evidence of acute intracranial hemorrhage, mass lesion, brain edema or extra-axial fluid collection. Extensive chronic encephalomalacia is again noted in the left parietooccipital lobe with associated ventriculomegaly and patchy periventricular white matter disease. No evidence of acute infarct or hydrocephalus. Vascular: Left carotid siphon aneurysm coil again noted. No hyperdense vessel. Skull: Negative for fracture or focal lesion. Sinuses/Orbits: The visualized paranasal sinuses, mastoid air cells and middle ears are clear. No orbital abnormalities are seen. Other: None. CT CERVICAL SPINE FINDINGS Alignment: Mild convex left scoliosis.  Normal sagittal alignment. 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 No evidence of acute fracture or traumatic subluxation. Soft tissues and spinal canal: No prevertebral fluid or swelling. No visible canal hematoma. Disc levels: Multilevel spondylosis again noted, greatest at C5-6 and C6-7. No high-grade spinal stenosis identified. There are scattered facet degenerative changes which are worst on the left at C4-5. Upper chest: Unremarkable. Other: None. IMPRESSION: 1. No  acute intracranial or calvarial findings. 2. Stable chronic left parietooccipital encephalomalacia, small vessel ischemic changes and ventriculomegaly. 3. No evidence of cervical spine fracture, traumatic subluxation or static signs of instability. Electronically Signed   By: Carey BullocksWilliam  Veazey M.D.   On: 01/21/2019 16:27   Ct Head Wo Contrast  Result Date: 12/27/2018 CLINICAL DATA:  68 year old female with head injury following fall. EXAM: CT HEAD WITHOUT CONTRAST TECHNIQUE: Contiguous axial images were obtained from the base of the skull through the vertex without intravenous contrast. COMPARISON:  12/24/2018 CT and prior exams FINDINGS: Brain: No evidence of acute infarction, hemorrhage, hydrocephalus, extra-axial collection or mass lesion/mass effect. Atrophy, chronic small-vessel white matter ischemic changes and LEFT parieto-occipital encephalomalacia again noted. Vascular: LEFT carotid aneurysm COIL again noted. Skull: Normal. Negative for fracture or focal lesion. Sinuses/Orbits: No acute finding. Other: None. IMPRESSION: 1. No evidence of acute intracranial abnormality 2. Atrophy, chronic small-vessel white matter ischemic changes and LEFT parieto-occipital encephalomalacia. Electronically Signed   By: Harmon PierJeffrey  Hu M.D.   On: 12/27/2018 08:57   Ct Cervical Spine Wo Contrast  Result Date: 01/22/2019 CLINICAL DATA:  Fall with head injury.  No loss of consciousness. EXAM: CT HEAD WITHOUT CONTRAST CT CERVICAL SPINE WITHOUT CONTRAST TECHNIQUE: Multidetector CT imaging of the head and cervical spine was performed following the standard protocol without intravenous contrast. Multiplanar CT image reconstructions of the cervical spine were also generated. COMPARISON:  CT 12/27/2018 and 12/24/2018. Cervical spine CT 12/24/2018. FINDINGS: CT HEAD FINDINGS Brain: There is no evidence of acute intracranial hemorrhage, mass lesion, brain edema or extra-axial fluid collection. Extensive chronic encephalomalacia is  again noted in the left parietooccipital lobe with associated ventriculomegaly and patchy periventricular white matter disease. No evidence of acute infarct or hydrocephalus. Vascular: Left carotid siphon aneurysm coil again noted. No hyperdense vessel. Skull: Negative for fracture or focal lesion. Sinuses/Orbits: The visualized paranasal sinuses, mastoid air cells and middle ears are clear. No orbital abnormalities are seen. Other: None. CT CERVICAL SPINE FINDINGS Alignment: Mild convex left scoliosis.  Normal sagittal alignment. 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 No evidence of acute fracture or traumatic subluxation. Soft tissues and spinal canal: No prevertebral fluid or swelling. No visible canal hematoma. Disc levels: Multilevel spondylosis again noted, greatest at C5-6  and C6-7. No high-grade spinal stenosis identified. There are scattered facet degenerative changes which are worst on the left at C4-5. Upper chest: Unremarkable. Other: None. IMPRESSION: 1. No acute intracranial or calvarial findings. 2. Stable chronic left parietooccipital encephalomalacia, small vessel ischemic changes and ventriculomegaly. 3. No evidence of cervical spine fracture, traumatic subluxation or static signs of instability. Electronically Signed   By: Carey Bullocks M.D.   On: 02/11/2019 16:27   Dg Chest Port 1 View  Result Date: 01/24/2019 CLINICAL DATA:  Fever. EXAM: PORTABLE CHEST 1 VIEW COMPARISON:  Radiographs of December 27, 2018. FINDINGS: The heart size and mediastinal contours are within normal limits. No pneumothorax or pleural effusion is noted. Stable bibasilar opacities are noted concerning for subsegmental atelectasis, scarring or possibly infiltrates. The visualized skeletal structures are unremarkable. IMPRESSION: Stable mild bibasilar opacities are noted concerning for subsegmental atelectasis, scarring or possibly infiltrates. Aortic Atherosclerosis (ICD10-I70.0).  Electronically Signed   By: Lupita Raider, M.D.   On: 01/24/2019 17:35    Lab Data:  CBC: Recent Labs  Lab 02-11-19 1506 01/24/19 1521 01/24/19 1535 01/25/19 0744  WBC 2.8* 3.4*  --  3.6*  NEUTROABS 1.9 2.4  --   --   HGB 11.8* 12.8 13.6 13.3  HCT 37.5 41.7 40.0 41.9  MCV 93.5 92.1  --  90.7  PLT 162 144*  --  126*   Basic Metabolic Panel: Recent Labs  Lab Feb 11, 2019 1506 01/24/19 1521 01/24/19 1535 01/25/19 0744  NA 138 137 136 138  K 3.4* 4.1 3.8 3.6  CL 102 98  --  99  CO2 27 29  --  23  GLUCOSE 92 92  --  94  BUN 10 8  --  9  CREATININE 1.05* 0.92  --  1.04*  CALCIUM 8.2* 8.5*  --  8.6*   GFR: Estimated Creatinine Clearance: 49.1 mL/min (A) (by C-G formula based on SCr of 1.04 mg/dL (H)). Liver Function Tests: Recent Labs  Lab 01/24/19 1521  AST 54*  ALT 35  ALKPHOS 73  BILITOT 0.9  PROT 6.9  ALBUMIN 3.7   No results for input(s): LIPASE, AMYLASE in the last 168 hours. No results for input(s): AMMONIA in the last 168 hours. Coagulation Profile: No results for input(s): INR, PROTIME in the last 168 hours. Cardiac Enzymes: No results for input(s): CKTOTAL, CKMB, CKMBINDEX, TROPONINI in the last 168 hours. BNP (last 3 results) No results for input(s): PROBNP in the last 8760 hours. HbA1C: No results for input(s): HGBA1C in the last 72 hours. CBG: Recent Labs  Lab 01/24/19 2320 01/25/19 0850  GLUCAP 102* 79   Lipid Profile: No results for input(s): CHOL, HDL, LDLCALC, TRIG, CHOLHDL, LDLDIRECT in the last 72 hours. Thyroid Function Tests: No results for input(s): TSH, T4TOTAL, FREET4, T3FREE, THYROIDAB in the last 72 hours. Anemia Panel: No results for input(s): VITAMINB12, FOLATE, FERRITIN, TIBC, IRON, RETICCTPCT in the last 72 hours. Urine analysis:    Component Value Date/Time   COLORURINE STRAW (A) 01/24/2019 2028   APPEARANCEUR CLEAR 01/24/2019 2028   LABSPEC 1.005 01/24/2019 2028   PHURINE 8.0 01/24/2019 2028   GLUCOSEU NEGATIVE  01/24/2019 2028   GLUCOSEU NEGATIVE 09/02/2018 1042   HGBUR SMALL (A) 01/24/2019 2028   BILIRUBINUR NEGATIVE 01/24/2019 2028   BILIRUBINUR Negative 09/24/2018 1010   KETONESUR 20 (A) 01/24/2019 2028   PROTEINUR NEGATIVE 01/24/2019 2028   UROBILINOGEN negative (A) 09/24/2018 1010   UROBILINOGEN 0.2 09/02/2018 1042   NITRITE NEGATIVE 01/24/2019 2028  LEUKOCYTESUR NEGATIVE 01/24/2019 2028     Leeana Creer M.D. [facility] 01/25/2019, 11:22 AM  Pager: [phone] Between 7am to 7pm - call Pager - [phone]  After 7pm go to [email or web address] - password [email or web address]  Call night coverage person covering after 7pm

## 2019-01-25 NOTE — Clinical Social Work Note (Signed)
Pt is not alert and oriented and CSW can not reach pt's spouse. Unable to complete readmission risk assessment at this time. Left a voicemail for pt's spouse. Pt has a PCP listed in our system. CSW unable to confirm.  [place], [place] [phone]

## 2019-01-25 NOTE — Consult Note (Addendum)
Consultation Note Date: 01/25/2019   Patient Name: Angela Fields  DOB: 01-07-1951  MRN: [id number]  Age / Sex: 68 y.o., female  PCP: Marisue Brooklyn Referring Physician: Cathren Harsh, MD  Reason for Consultation: Establishing goals of care  HPI/Patient Profile: 68 y.o. female  with past medical history of advanced dementia, T2DM, anxiety, seizures, and CVA admitted on Jan 24, 2019 with a fall.  Had abrasions on head. CT head and neck negative. In ED, patient developed fever, worsening confusion, and hypoxia. Tested for COVID - positive result today. Of note, husband is also currently hospitalized d/t COVID. PMT consulted for GOC.  Clinical Assessment and Goals of Care: I have reviewed medical records including EPIC notes, labs and imaging, received report from Dr. Isidoro Donning and RN, and then spoke with patient's sister-in-law, Shereen Voytko,  to discuss diagnosis prognosis, GOC, EOL wishes, disposition and options.  Conversations with Dr. Julaine Fusi and Dr. Isidoro Donning about this patient as case is complicated d/t patient's husband's illness. Patient has advanced dementia and is unable to make her own medical decisions - her husband is her caregiver and Management consultant. However, husband is critically ill with COVID and is unable to participate in conversations d/t respiratory distress/medical instability. Therefore, goals of care discussions were had with patient's sister-in-law. Sister-in-law shares she is next of kin - she tells me patient has no children and no other family involved. She shares "I am like a child to them". She tells me she is comfortable serving as surrogate decisions maker until husband regains ability to do so. Discussed this with Dr. Julaine Fusi who agrees that sister-in-law should serve as decision maker at this time.   I introduced Palliative Medicine as specialized medical care for people living with serious illness. It focuses on  providing relief from the symptoms and stress of a serious illness. The goal is to improve quality of life for both the patient and the family.  As far as functional and nutritional status, SIL shares that patient was dependent on husband for most ADLs. Shares that she "wandered" often. Also tells me patient was combative many times - shares that police were called to home often d/t her combativeness. She also tells me that patient had a poor appetite. Chart review reveals about 10 pound weight loss in 2 months. She shares that in her opinion, patient has a poor quality of life.    We discussed her current illness and what it means in the larger context of her on-going co-morbidities.  Natural disease trajectory and expectations at EOL were discussed. Discussed patient suspected to have COVID - results were not back at time of conversation. Discussed concern for patient d/t poor baseline status.  Advance directives, concepts specific to code status, artifical feeding and hydration, and rehospitalization were considered and discussed. Specifically discussed code status, recommendation made for DNR status d/t patient's advanced dementia. SIL agrees that this is appropriate and feels patient's spouse would agree as well. Discussed that all other care will continue at this point.   Discussed patient's extreme agitation and measures being taken to calm patient and ensure safety. SIL is going to attempt to call patient and calm her as well.   SIL shares with me that her father recently passed away with COVID as well - she is very familiar with the disease severity of it.   Questions and concerns were addressed. The family was encouraged to call with questions or concerns.   Primary Decision Maker NEXT OF KIN - sister-in-law -  Delman Kittenarolyn Lomax (patient's spouse is currently hospitalized/respiratory distress/ unable to serve as Management consultantdecision maker)  SUMMARY OF RECOMMENDATIONS   - at this time, patient's  sister-in-law will serve as surrogate decision maker d/t patient's dementia and patient's spouses' critical illness - discussed with Dr. Isidoro Donningai and Dr. Julaine FusiBeth Golding who agree - sister-in-law agrees with code status change to DNR - per RN, patient is currently sleepy with regimen for agitation - responds well to ativan - continue PRN ativan, scheduled seroquel - if patient declines, would recommend full comfort care - PMT will continue to follow  Code Status/Advance Care Planning:  DNR  Prognosis:   Unable to determine  Discharge Planning: To Be Determined      Primary Diagnoses: Present on Admission: . Hypertension . Hyperlipidemia . Dementia with behavioral disturbance (HCC) . CAP (community acquired pneumonia)   I have reviewed the medical record, interviewed the patient and family, and examined the patient. The following aspects are pertinent.  Past Medical History:  Diagnosis Date  . Anxiety   . Dementia (HCC)   . Diabetes mellitus without complication (HCC)   . Hyperlipidemia   . Hypertension   . Mood disorder (HCC)   . Rheumatic fever   . Seizures (HCC)   . Sleep apnea   . Stroke [facility](HCC)    Social History   Socioeconomic History  . Marital status: Married    Spouse name: Not on file  . Number of children: 0  . Years of education: some college  . Highest education level: Not on file  Occupational History  . Occupation: Retired  Engineer, productionocial Needs  . Financial resource strain: Not on file  . Food insecurity:    Worry: Not on file    Inability: Not on file  . Transportation needs:    Medical: Not on file    Non-medical: Not on file  Tobacco Use  . Smoking status: Former Games developermoker  . Smokeless tobacco: Never Used  . Tobacco comment: quit in 1970's  Substance and Sexual Activity  . Alcohol use: No    Comment: none since 2010 - then only one drink per week  . Drug use: No  . Sexual activity: Not on file  Lifestyle  . Physical activity:    Days per week: Not on  file    Minutes per session: Not on file  . Stress: Not on file  Relationships  . Social connections:    Talks on phone: Not on file    Gets together: Not on file    Attends religious service: Not on file    Active member of club or organization: Not on file    Attends meetings of clubs or organizations: Not on file    Relationship status: Not on file  Other Topics Concern  . Not on file  Social History Narrative   Lives at home with husband.   Right-handed.   No caffeine use.   Family History  Problem Relation Age of Onset  . Other Mother        "blood infection"  . Heart attack Father    Scheduled Meds: . aspirin EC  81 mg Oral Daily  . azithromycin  500 mg Oral Q24H  . divalproex  500 mg Oral QHS  . donepezil  10 mg Oral Daily  . enoxaparin (LOVENOX) injection  40 mg Subcutaneous Q24H  . felodipine  5 mg Oral Daily  . folic acid  1 mg Oral Daily  . hydroxychloroquine  400 mg Oral BID  Followed by  . [START ON 01/26/2019] hydroxychloroquine  200 mg Oral BID  . insulin aspart  0-5 Units Subcutaneous QHS  . insulin aspart  0-9 Units Subcutaneous TID WC  . lacosamide  150 mg Oral BID  . lamoTRIgine  150 mg Oral BID  . linaclotide  72 mcg Oral QAC breakfast  . pravastatin  10 mg Oral QHS  . QUEtiapine  25 mg Oral BID   Continuous Infusions: . cefTRIAXone (ROCEPHIN)  IV     PRN Meds:.LORazepam, ondansetron (ZOFRAN) IV No Known Allergies  Vital Signs: BP (!) 146/66 (BP Location: Right Arm)   Pulse 70   Temp (!) 100.8 F (38.2 C) (Oral)   Resp 16   Ht  (1.651 m)   Wt 68.9 kg   SpO2 95%   BMI 25.29 kg/m  Pain Scale: 0-10   Pain Score: 0-No pain   SpO2: SpO2: 95 % O2 Device:SpO2: 95 % O2 Flow Rate: .O2 Flow Rate (L/min): 2 L/min  IO: Intake/output summary:   Intake/Output Summary (Last 24 hours) at 01/25/2019 1558 Last data filed at 01/25/2019 0930 Gross per 24 hour  Intake 120 ml  Output 950 ml  Net -830 ml    LBM:   Baseline Weight: Weight:  69.4 kg Most recent weight: Weight: 68.9 kg     Palliative Assessment/Data: PPS 20%    The above conversation was completed via telephone due to the visitor restrictions during the COVID-19 pandemic. Thorough chart review and discussion with necessary members of the care team was completed as part of assessment. All issues were discussed and addressed but no physical exam was performed.    Time Total: 90 minutes Greater than 50%  of this time was spent counseling and coordinating care related to the above assessment and plan.  Gerlean Ren, [name], AGNP-C Palliative Medicine Team [phone] Pager: [phone]

## 2019-01-25 NOTE — Plan of Care (Signed)
  Problem: Education: Goal: Knowledge of General Education information will improve Description: Including pain rating scale, medication(s)/side effects and non-pharmacologic comfort measures Outcome: Not Progressing   Problem: Health Behavior/Discharge Planning: Goal: Ability to manage health-related needs will improve Outcome: Not Progressing   

## 2019-01-25 NOTE — Progress Notes (Signed)
Phlebotomist was in patient's room attempting to draw patient's ordered labs. Patient became combative. Patient yelled, "Get away from me! I'm going home. Let me go home." Patient jumped up out of bed and was grabbing phlebotomist and ripped her gown. Nurse tech and RN had get patient back in bed and call for additional help. Patient was grabbing and bending back Nurse Tech's hand and scratched this RN's arm and hand. Patient also attempted to bite and kick staff. NP Bodenheimer paged about patient's condition and ordered 1mg  IV ativan and bilateral wrists and ankle restraints. Will continue to monitor and assess patient.

## 2019-01-26 ENCOUNTER — Other Ambulatory Visit: Payer: Self-pay

## 2019-01-26 DIAGNOSIS — F0390 Unspecified dementia without behavioral disturbance: Secondary | ICD-10-CM

## 2019-01-26 LAB — BASIC METABOLIC PANEL
Anion gap: 14 (ref 5–15)
BUN: 11 mg/dL (ref 8–23)
CO2: 27 mmol/L (ref 22–32)
Calcium: 8.3 mg/dL — ABNORMAL LOW (ref 8.9–10.3)
Chloride: 97 mmol/L — ABNORMAL LOW (ref 98–111)
Creatinine, Ser: 1.01 mg/dL — ABNORMAL HIGH (ref 0.44–1.00)
GFR calc Af Amer: >60 mL/min (ref >60)
GFR calc non Af Amer: 56 mL/min — ABNORMAL LOW (ref >60)
Glucose, Bld: 99 mg/dL (ref 70–99)
Potassium: 4.4 mmol/L (ref 3.5–5.1)
Sodium: 138 mmol/L (ref 135–145)

## 2019-01-26 LAB — AMMONIA: Ammonia: 53 umol/L — ABNORMAL HIGH (ref 9–35)

## 2019-01-26 LAB — CBC
HCT: 42.4 % (ref 36.0–46.0)
Hemoglobin: 13.8 g/dL (ref 12.0–15.0)
MCH: 28.7 pg (ref 26.0–34.0)
MCHC: 32.5 g/dL (ref 30.0–36.0)
MCV: 88.1 fL (ref 80.0–100.0)
Platelets: 92 10*3/uL — ABNORMAL LOW (ref 150–400)
RBC: 4.81 MIL/uL (ref 3.87–5.11)
RDW: 13.7 % (ref 11.5–15.5)
WBC: 4.7 10*3/uL (ref 4.0–10.5)
nRBC: 0 % (ref 0.0–0.2)

## 2019-01-26 LAB — GLUCOSE, CAPILLARY
Glucose-Capillary: 88 mg/dL (ref 70–99)
Glucose-Capillary: 90 mg/dL (ref 70–99)
Glucose-Capillary: 73 mg/dL (ref 70–99)
Glucose-Capillary: 69 mg/dL — ABNORMAL LOW (ref 70–99)
Glucose-Capillary: 92 mg/dL (ref 70–99)

## 2019-01-26 LAB — VALPROIC ACID LEVEL: Valproic Acid Lvl: 61 ug/mL (ref 50.0–100.0)

## 2019-01-26 MED ORDER — WHITE PETROLATUM EX OINT
TOPICAL_OINTMENT | CUTANEOUS | Status: AC
  Administered 2019-01-26: 0.2
  Filled 2019-01-26: qty 28.35

## 2019-01-26 MED ORDER — INSULIN ASPART 100 UNIT/ML ~~LOC~~ SOLN: 0.0000 [IU] | Freq: Three times a day (TID) | SUBCUTANEOUS | Status: DC

## 2019-01-26 MED ORDER — LORAZEPAM 2 MG/ML IJ SOLN
0.5000 mg | INTRAMUSCULAR | Status: DC | PRN
  Administered 2019-01-28: 0.5 mg via INTRAVENOUS
  Filled 2019-01-26: qty 1

## 2019-01-26 NOTE — Progress Notes (Signed)
On morning assessment patient calm, resting, Ax1. Four point restraints in place. Spoke with Dr. Isidoro Donning at bedside. Plan to discontinue bilateral soft ankle restraint. Plan to continue bilateral soft wrist restraint. Dr. Isidoro Donning said she will place order.   Alver Sorrow, RN

## 2019-01-26 NOTE — Progress Notes (Signed)
[facility]                                                                              Patient Demographics  Angela Fields, is a 68 y.o. female, DOB - [birth year], ZOX:[id number]  Admit date - Feb 05, 2019   Admitting Physician Inez Catalina, MD  Outpatient Primary MD for the patient is Saguier, Kateri Mc  Outpatient specialists:   LOS - 2  days   Medical records reviewed and are as summarized below:    Chief Complaint  Patient presents with   Fall       Brief summary   Patient is a 68 year old female with severe dementia, diabetes mellitus type 2, anxiety, history of seizures, stroke presented after she was found fallen by a bystander and presented to ED on 4/11 for abrasions around her head.  CT head and neck at that time did not show acute issues.  Unfortunately no family was able to be found and patient remained in ED for 24 hours.  In ED, patient was found to have worsening of the symptoms with more confusion, hypoxia, temperature 100.3 F and chest x-ray changes, patient was admitted for COVID 19 rule out.  Unfortunately her husband is also admitted in the hospital with the COVID-19 (positive) pneumonia.  COVID-19 positive  Assessment & Plan    Principal Problem:   Acute respiratory failure with hypoxia (HCC) suspected COVID-19 -Patient found to have fever, hypoxia, new oxygen requirement, chest x-ray changes with leukopenia, mildly elevated LFTs, exposure (husband positive for covid pneumonia). -COVID-19 positive -Palliative consult given severe dementia, appreciate assistance, DNR status -Continue Zithromax, Plaquenil, follow QTC  Active Problems:   Hypertension -BP currently stable, continue felodipine - placed on hydralazine IV as needed with parameters if sedated    Hyperlipidemia -Continue pravastatin  History of seizures (HCC) -Continue seizure precautions -Very sedated today, on reviewing patient's chart,  Per Dr. Zannie Cove note on  12/15/2018, patient was weaning off of Vimpat.  She was started on Depakote 500 mg qhs on 12/16/2018.  Lamotrigine was reduced to 300 mg at bedtime on 12/25/18. -will check Depakote level, lamotrigine level.  A.m. doses of seizure meds has been held due to sedation. -I discussed with Dr. Joycelyn Schmid (neurology) who reviewed patient's chart.  Patient is not on Vimpat, she has been weaned off.  Med rec was not done at the time of admission, hence it was carried on. -DC Vimpat, discussed with pharmacist, who verified med rec.     Dementia with behavioral disturbance (HCC) -Continue telemetry sitter, Seroquel  -palliative consulted -If remains too sedated will change Seroquel to bedtime only, add Klonopin 0.5 mg twice daily as needed if agitated (home medication).   Code Status: Full code, palliative goals of care pending DVT Prophylaxis:  Lovenox  Family Communication: Discussed in detail with the patient's sister-in-law.  Patient's husband is unfortunately also admitted on the same unit with covid pneumonia  Disposition Plan:   Time Spent in minutes   35 minutes  Procedures:  None  Consultants:   Palliative medicine  Antimicrobials:   Anti-infectives (From admission, onward)   Start  Dose/Rate Route Frequency Ordered Stop   01/26/19 1000  hydroxychloroquine (PLAQUENIL) tablet 200 mg     200 mg Oral 2 times daily 01/25/19 1515 01/30/19 0959   01/25/19 2200  cefTRIAXone (ROCEPHIN) 1 g in sodium chloride 0.9 % 100 mL IVPB  Status:  Discontinued     1 g 200 mL/hr over 30 Minutes Intravenous Every 24 hours 01/24/19 2126 01/26/19 0807   01/25/19 1530  hydroxychloroquine (PLAQUENIL) tablet 400 mg     400 mg Oral 2 times daily 01/25/19 1515 01/25/19 2147   01/24/19 2200  azithromycin (ZITHROMAX) tablet 500 mg     500 mg Oral Every 24 hours 01/24/19 2126 01/31/19 2159   01/24/19 1745  cefTRIAXone (ROCEPHIN) 1 g in sodium chloride 0.9 % 100 mL IVPB     1 g 200 mL/hr over 30 Minutes  Intravenous  Once 01/24/19 1740 01/24/19 2059   01/24/19 1745  azithromycin (ZITHROMAX) 500 mg in sodium chloride 0.9 % 250 mL IVPB  Status:  Discontinued     500 mg 250 mL/hr over 60 Minutes Intravenous  Once 01/24/19 1740 01/24/19 2126         Medications  Scheduled Meds:  aspirin EC  81 mg Oral Daily   azithromycin  500 mg Oral Q24H   divalproex  500 mg Oral QHS   donepezil  10 mg Oral Daily   enoxaparin (LOVENOX) injection  40 mg Subcutaneous Q24H   felodipine  5 mg Oral Daily   folic acid  1 mg Oral Daily   hydroxychloroquine  200 mg Oral BID   insulin aspart  0-5 Units Subcutaneous QHS   insulin aspart  0-9 Units Subcutaneous TID WC   lacosamide  150 mg Oral BID   lamoTRIgine  150 mg Oral BID   linaclotide  72 mcg Oral QAC breakfast   pravastatin  10 mg Oral QHS   QUEtiapine  25 mg Oral BID   Continuous Infusions:  PRN Meds:.LORazepam, ondansetron (ZOFRAN) IV      Subjective:   Marna Weniger was seen and examined today.  At the time of my examination early morning, patient was alert, confused and singing.  Subsequently too sedated.  Still spiking low-grade fevers, 100.8 F.  Unable to do ROS with the patient due to severe dementia.    Objective:   Vitals:   01/25/19 0856 01/26/19 0025 01/26/19 0749 01/26/19 1031  BP: (!) 146/66 (!) 165/66 (!) 152/68 140/74  Pulse: 70 73 78   Resp:  20    Temp: (!) 100.8 F (38.2 C) 99.2 F (37.3 C) (!) 100.8 F (38.2 C)   TempSrc: Oral Axillary Oral   SpO2:  95% 95% 96%  Weight:      Height:        Intake/Output Summary (Last 24 hours) at 01/26/2019 1131 Last data filed at 01/26/2019 1054 Gross per 24 hour  Intake 530.22 ml  Output 1 ml  Net 529.22 ml     Wt Readings from Last 3 Encounters:  01/24/19 68.9 kg  12/29/18 76.7 kg  12/27/18 69.4 kg    Physical Exam  General: At the time of my examination earlier, she was alert and confused, singing  Eyes:   HEENT:    Cardiovascular: S1 S2  clear, RRR. No pedal edema b/l  Respiratory: CTAB, no wheezing, rales or rhonchi  Gastrointestinal: Soft, nontender, nondistended, NBS  Ext: no pedal edema bilaterally  Neuro: In restraints  Musculoskeletal: No cyanosis, clubbing  Skin: No rashes  Psych: Confused    Data Reviewed:  I have personally reviewed following labs and imaging studies  Micro Results Recent Results (from the past 240 hour(s))  SARS Coronavirus 2 [facility] order, Performed in [facility] hospital lab)     Status: Abnormal   Collection Time: 01/24/19  9:46 AM  Result Value Ref Range Status   SARS Coronavirus 2 POSITIVE (A) NEGATIVE Final    Comment: RESULT CALLED TO, READ BACK BY AND VERIFIED WITH: RN Shelia Media [phone] (NOTE) If result is NEGATIVE SARS-CoV-2 target nucleic acids are NOT DETECTED. The SARS-CoV-2 RNA is generally detectable in upper and lower  respiratory specimens during the acute phase of infection. The lowest  concentration of SARS-CoV-2 viral copies this assay can detect is 250  copies / mL. A negative result does not preclude SARS-CoV-2 infection  and should not be used as the sole basis for treatment or other  patient management decisions.  A negative result may occur with  improper specimen collection / handling, submission of specimen other  than nasopharyngeal swab, presence of viral mutation(s) within the  areas targeted by this assay, and inadequate number of viral copies  (<250 copies / mL). A negative result must be combined with clinical  observations, patient history, and epidemiological information. If result is POSITIVE SARS-CoV-2 target nucleic acids are DETECTED. The SARS- CoV-2 RNA is generally detectable in upper and lower  respiratory specimens during the acute phase of infection.  Positive  results are indicative of active infection with SARS-CoV-2.  Clinical  correlation with patient history and other diagnostic information is  necessary to determine  patient infection status.  Positive results do  not rule out bacterial infection or co-infection with other viruses. If result is PRESUMPTIVE POSTIVE SARS-CoV-2 nucleic acids MAY BE PRESENT.   A presumptive positive result was obtained on the submitted specimen  and confirmed on repeat testing.  While 2019 novel coronavirus  (SARS-CoV-2) nucleic acids may be present in the submitted sample  additional confirmatory testing may be necessary for epidemiological  and / or clinical management purposes  to differentiate between  SARS-CoV-2 and other Sarbecovirus currently known to infect humans.  If clinically indicated additional testing with an alternate test  methodology [phone]) is advised . The SARS-CoV-2 RNA is generally  detectable in upper and lower respiratory specimens during the acute  phase of infection. The expected result is Negative. Fact Sheet for Patients:  BoilerBrush.com.cy Fact Sheet for Healthcare Providers: https://pope.com/ This test is not yet approved or cleared by the [place] FDA and has been authorized for detection and/or diagnosis of SARS-CoV-2 by FDA under an Emergency Use Authorization (EUA).  This EUA will remain in effect (meaning this test can be used) for the duration of the COVID-19 declaration under Section 564(b)(1) of the Act, 21 U.S.C. section 360bbb-3(b)(1), unless the authorization is terminated or revoked sooner. Performed at [facility], [address], [place], [place] [address]   Blood Culture (routine x 2)     Status: None (Preliminary result)   Collection Time: 01/24/19  3:19 PM  Result Value Ref Range Status   Specimen Description BLOOD SITE NOT SPECIFIED  Final   Special Requests   Final    BOTTLES DRAWN AEROBIC ONLY Blood Culture adequate volume   Culture   Final    NO GROWTH < 24 HOURS Performed at [facility], [address] [place], [place] [address]    Report  Status PENDING  Incomplete  Blood Culture (routine x 2)     Status: None (Preliminary result)   Collection Time: 01/24/19  3:27 PM  Result Value Ref Range Status   Specimen Description BLOOD WRIST RIGHT  Final   Special Requests   Final    BOTTLES DRAWN AEROBIC AND ANAEROBIC Blood Culture adequate volume   Culture   Final    NO GROWTH < 24 HOURS Performed at [facility], [address] [place], [place] [address]    Report Status PENDING  Incomplete    Radiology Reports Ct Head Wo Contrast  Result Date: 01/31/2019 CLINICAL DATA:  Fall with head injury.  No loss of consciousness. EXAM: CT HEAD WITHOUT CONTRAST CT CERVICAL SPINE WITHOUT CONTRAST TECHNIQUE: Multidetector CT imaging of the head and cervical spine was performed following the standard protocol without intravenous contrast. Multiplanar CT image reconstructions of the cervical spine were also generated. COMPARISON:  CT 12/27/2018 and 12/24/2018. Cervical spine CT 12/24/2018. FINDINGS: CT HEAD FINDINGS Brain: There is no evidence of acute intracranial hemorrhage, mass lesion, brain edema or extra-axial fluid collection. Extensive chronic encephalomalacia is again noted in the left parietooccipital lobe with associated ventriculomegaly and patchy periventricular white matter disease. No evidence of acute infarct or hydrocephalus. Vascular: Left carotid siphon aneurysm coil again noted. No hyperdense vessel. Skull: Negative for fracture or focal lesion. Sinuses/Orbits: The visualized paranasal sinuses, mastoid air cells and middle ears are clear. No orbital abnormalities are seen. Other: None. CT CERVICAL SPINE FINDINGS Alignment: Mild convex left scoliosis.  Normal sagittal alignment. 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 No evidence of acute fracture or traumatic subluxation. Soft tissues and spinal canal: No prevertebral fluid or swelling. No visible canal hematoma. Disc levels: Multilevel  spondylosis again noted, greatest at C5-6 and C6-7. No high-grade spinal stenosis identified. There are scattered facet degenerative changes which are worst on the left at C4-5. Upper chest: Unremarkable. Other: None. IMPRESSION: 1. No acute intracranial or calvarial findings. 2. Stable chronic left parietooccipital encephalomalacia, small vessel ischemic changes and ventriculomegaly. 3. No evidence of cervical spine fracture, traumatic subluxation or static signs of instability. Electronically Signed   By: Carey Bullocks M.D.   On: 31-Jan-2019 16:27   Ct Cervical Spine Wo Contrast  Result Date: Jan 31, 2019 CLINICAL DATA:  Fall with head injury.  No loss of consciousness. EXAM: CT HEAD WITHOUT CONTRAST CT CERVICAL SPINE WITHOUT CONTRAST TECHNIQUE: Multidetector CT imaging of the head and cervical spine was performed following the standard protocol without intravenous contrast. Multiplanar CT image reconstructions of the cervical spine were also generated. COMPARISON:  CT 12/27/2018 and 12/24/2018. Cervical spine CT 12/24/2018. FINDINGS: CT HEAD FINDINGS Brain: There is no evidence of acute intracranial hemorrhage, mass lesion, brain edema or extra-axial fluid collection. Extensive chronic encephalomalacia is again noted in the left parietooccipital lobe with associated ventriculomegaly and patchy periventricular white matter disease. No evidence of acute infarct or hydrocephalus. Vascular: Left carotid siphon aneurysm coil again noted. No hyperdense vessel. Skull: Negative for fracture or focal lesion. Sinuses/Orbits: The visualized paranasal sinuses, mastoid air cells and middle ears are clear. No orbital abnormalities are seen. Other: None. CT CERVICAL SPINE FINDINGS Alignment: Mild convex left scoliosis.  Normal sagittal alignment. 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 No evidence of acute fracture or traumatic subluxation. Soft tissues and spinal canal: No  prevertebral fluid or swelling. No visible canal hematoma. Disc levels: Multilevel spondylosis again noted, greatest at C5-6 and C6-7. No high-grade  spinal stenosis identified. There are scattered facet degenerative changes which are worst on the left at C4-5. Upper chest: Unremarkable. Other: None. IMPRESSION: 1. No acute intracranial or calvarial findings. 2. Stable chronic left parietooccipital encephalomalacia, small vessel ischemic changes and ventriculomegaly. 3. No evidence of cervical spine fracture, traumatic subluxation or static signs of instability. Electronically Signed   By: Carey Bullocks M.D.   On: 01/17/2019 16:27   Dg Chest Port 1 View  Result Date: 01/24/2019 CLINICAL DATA:  Fever. EXAM: PORTABLE CHEST 1 VIEW COMPARISON:  Radiographs of December 27, 2018. FINDINGS: The heart size and mediastinal contours are within normal limits. No pneumothorax or pleural effusion is noted. Stable bibasilar opacities are noted concerning for subsegmental atelectasis, scarring or possibly infiltrates. The visualized skeletal structures are unremarkable. IMPRESSION: Stable mild bibasilar opacities are noted concerning for subsegmental atelectasis, scarring or possibly infiltrates. Aortic Atherosclerosis (ICD10-I70.0). Electronically Signed   By: Lupita Raider, M.D.   On: 01/24/2019 17:35    Lab Data:  CBC: Recent Labs  Lab 02/06/2019 1506 01/24/19 1521 01/24/19 1535 01/25/19 0744 01/26/19 0514  WBC 2.8* 3.4*  --  3.6* 4.7  NEUTROABS 1.9 2.4  --   --   --   HGB 11.8* 12.8 13.6 13.3 13.8  HCT 37.5 41.7 40.0 41.9 42.4  MCV 93.5 92.1  --  90.7 88.1  PLT 162 144*  --  126* 92*   Basic Metabolic Panel: Recent Labs  Lab 01/27/2019 1506 01/24/19 1521 01/24/19 1535 01/25/19 0744 01/26/19 0514  NA 138 137 136 138 138  K 3.4* 4.1 3.8 3.6 4.4  CL 102 98  --  99 97*  CO2 27 29  --  23 27  GLUCOSE 92 92  --  94 99  BUN 10 8  --  9 11  CREATININE 1.05* 0.92  --  1.04* 1.01*  CALCIUM 8.2* 8.5*   --  8.6* 8.3*   GFR: Estimated Creatinine Clearance: 50.6 mL/min (A) (by C-G formula based on SCr of 1.01 mg/dL (H)). Liver Function Tests: Recent Labs  Lab 01/24/19 1521  AST 54*  ALT 35  ALKPHOS 73  BILITOT 0.9  PROT 6.9  ALBUMIN 3.7   No results for input(s): LIPASE, AMYLASE in the last 168 hours. No results for input(s): AMMONIA in the last 168 hours. Coagulation Profile: No results for input(s): INR, PROTIME in the last 168 hours. Cardiac Enzymes: No results for input(s): CKTOTAL, CKMB, CKMBINDEX, TROPONINI in the last 168 hours. BNP (last 3 results) No results for input(s): PROBNP in the last 8760 hours. HbA1C: No results for input(s): HGBA1C in the last 72 hours. CBG: Recent Labs  Lab 01/25/19 0850 01/25/19 1151 01/25/19 1633 01/25/19 2130 01/26/19 0744  GLUCAP 79 88 85 103* 88   Lipid Profile: No results for input(s): CHOL, HDL, LDLCALC, TRIG, CHOLHDL, LDLDIRECT in the last 72 hours. Thyroid Function Tests: No results for input(s): TSH, T4TOTAL, FREET4, T3FREE, THYROIDAB in the last 72 hours. Anemia Panel: Recent Labs    01/25/19 1046  FERRITIN 154   Urine analysis:    Component Value Date/Time   COLORURINE STRAW (A) 01/24/2019 2028   APPEARANCEUR CLEAR 01/24/2019 2028   LABSPEC 1.005 01/24/2019 2028   PHURINE 8.0 01/24/2019 2028   GLUCOSEU NEGATIVE 01/24/2019 2028   GLUCOSEU NEGATIVE 09/02/2018 1042   HGBUR SMALL (A) 01/24/2019 2028   BILIRUBINUR NEGATIVE 01/24/2019 2028   BILIRUBINUR Negative 09/24/2018 1010   KETONESUR 20 (A) 01/24/2019 2028   PROTEINUR NEGATIVE 01/24/2019  2028   UROBILINOGEN negative (A) 09/24/2018 1010   UROBILINOGEN 0.2 09/02/2018 1042   NITRITE NEGATIVE 01/24/2019 2028   LEUKOCYTESUR NEGATIVE 01/24/2019 2028     Aimee Timmons M.D. [facility] 01/26/2019, 11:31 AM  Pager: [phone] Between 7am to 7pm - call Pager - [phone]  After 7pm go to [email or web address] - password [email or web address]  Call night coverage person  covering after 7pm

## 2019-01-26 NOTE — Progress Notes (Signed)
Daily Progress Note   Patient Name: Angela Fields       Date: 01/26/2019 DOB: Oct 14, 1951  Age: 68 y.o. MRN#: [id number] Attending Physician: Cathren Harsh, MD Primary Care Physician: Marisue Brooklyn Admit Date: 01/24/2019  Reason for Consultation/Follow-up: Establishing goals of care  Subjective: Per RN and chart review, less agitated, removing ankle restraints, requiring 1 L oxygen  Length of Stay: 2  Current Medications: Scheduled Meds:  . aspirin EC  81 mg Oral Daily  . azithromycin  500 mg Oral Q24H  . divalproex  500 mg Oral QHS  . donepezil  10 mg Oral Daily  . enoxaparin (LOVENOX) injection  40 mg Subcutaneous Q24H  . felodipine  5 mg Oral Daily  . folic acid  1 mg Oral Daily  . hydroxychloroquine  200 mg Oral BID  . insulin aspart  0-5 Units Subcutaneous QHS  . insulin aspart  0-9 Units Subcutaneous TID WC  . lamoTRIgine  150 mg Oral BID  . linaclotide  72 mcg Oral QAC breakfast  . pravastatin  10 mg Oral QHS  . QUEtiapine  25 mg Oral BID    Continuous Infusions:   PRN Meds: LORazepam, ondansetron (ZOFRAN) IV    Vital Signs: BP 140/74 (BP Location: Right Arm)   Pulse 78   Temp (!) 100.8 F (38.2 C) (Oral)   Resp 20   Ht 5\' 5"  (1.651 m)   Wt 68.9 kg   SpO2 96%   BMI 25.29 kg/m  SpO2: SpO2: 96 % O2 Device: O2 Device: Nasal Cannula O2 Flow Rate: O2 Flow Rate (L/min): 1 L/min  Intake/output summary:   Intake/Output Summary (Last 24 hours) at 01/26/2019 1429 Last data filed at 01/26/2019 1054 Gross per 24 hour  Intake 530.22 ml  Output 1 ml  Net 529.22 ml   LBM: Last BM Date: (prior to arrival) Baseline Weight: Weight: 69.4 kg Most recent weight: Weight: 68.9 kg       Palliative Assessment/Data: PPS 20%    Flowsheet Rows     Most Recent Value   Intake Tab  Referral Department  Hospitalist  Unit at Time of Referral  Med/Surg Unit  Palliative Care Primary Diagnosis  Sepsis/Infectious Disease  Date Notified  01/25/19  Palliative Care Type  New Palliative care  Reason for referral  Clarify Goals of Care  Date of Admission  01/24/19  Date first seen by Palliative Care  01/25/19  # of days Palliative referral response time  0 Day(s)  # of days IP prior to Palliative referral  1  Clinical Assessment  Palliative Performance Scale Score  20%  Psychosocial & Spiritual Assessment  Palliative Care Outcomes  Patient/Family meeting held?  Yes  Who was at the meeting?  sister-in-law  Palliative Care Outcomes  Clarified goals of care, Provided psychosocial or spiritual support, Changed CPR status      Patient Active Problem List   Diagnosis Date Noted  . Acute respiratory failure with hypoxia (HCC) 01/25/2019  . COVID-19 virus detected   . Goals of care, counseling/discussion   . Palliative care by specialist   . CAP (community acquired pneumonia) 01/24/2019  . Hypoxia   . Nausea and vomiting in adult   .  Dementia with behavioral disturbance (HCC) 11/24/2018  . Hypoglycemia 07/30/2017  . Cognitive and behavioral changes 06/26/2017  . Seizure (HCC) 06/19/2017  . Status epilepticus (HCC)   . Bradycardia   . Hypokalemia   . Hypertension 09/24/2016  . History of CVA (cerebrovascular accident) 09/24/2016  . 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 Hyperlipidemia 09/24/2016    Palliative Care Assessment & Plan   HPI: 68 y.o. female  with past medical history of advanced dementia, T2DM, anxiety, seizures, and CVA admitted on 2019/02/12 with a fall.  Had abrasions on head. CT head and neck negative. In ED, patient developed fever, worsening confusion, and hypoxia. Tested positive for COVID. Of note, husband is also currently hospitalized d/t COVID. PMT consulted for GOC.  Assessment: Patient with some  improvement in mental status - less agitation. Requiring only 1L oxygen.  Husband remains ill in hospital and unable to participate in GOC conversations; therefore, sister-in-law, Angela Fields, remains Management consultantdecision maker.   Provided Angela Fields with update on patient as above. Okay with continuing current care. Remains DNR.   Angela Fields requests continued follow up by PMT - requests daily updates which we will provide.   Recommendations/Plan:  Sister-in-law remains Management consultantdecision maker (decided with Dr. Julaine FusiBeth Golding) as husband remains ill - updated today  DNR status  If patient declines, recommend full comfort care  PMT continue to follow and support sister-in-law who requests daily updates   Code Status:  DNR  Prognosis:   Unable to determine  Discharge Planning:  To Be Determined  Care plan was discussed with RN and sister-in-law  Thank you for allowing the Palliative Medicine Team to assist in the care of this patient.   Total Time 25 minutes Prolonged Time Billed  no       Greater than 50%  of this time was spent counseling and coordinating care related to the above assessment and plan.  Gerlean RenShae Lee Melannie Metzner, [name], [facility]NP[facility] Palliative Medicine Team Team Phone # [phone]  Pager [phone]

## 2019-01-27 ENCOUNTER — Other Ambulatory Visit: Payer: Self-pay

## 2019-01-27 LAB — CBC
HCT: 41 % (ref 36.0–46.0)
Hemoglobin: 12.8 g/dL (ref 12.0–15.0)
MCH: 28.8 pg (ref 26.0–34.0)
MCHC: 31.2 g/dL (ref 30.0–36.0)
MCV: 92.1 fL (ref 80.0–100.0)
Platelets: 125 10*3/uL — ABNORMAL LOW (ref 150–400)
RBC: 4.45 MIL/uL (ref 3.87–5.11)
RDW: 14.3 % (ref 11.5–15.5)
WBC: 4 10*3/uL (ref 4.0–10.5)
nRBC: 0 % (ref 0.0–0.2)

## 2019-01-27 LAB — BASIC METABOLIC PANEL
Anion gap: 11 (ref 5–15)
BUN: 18 mg/dL (ref 8–23)
CO2: 28 mmol/L (ref 22–32)
Calcium: 8.1 mg/dL — ABNORMAL LOW (ref 8.9–10.3)
Chloride: 100 mmol/L (ref 98–111)
Creatinine, Ser: 1.91 mg/dL — ABNORMAL HIGH (ref 0.44–1.00)
GFR calc Af Amer: 30 mL/min — ABNORMAL LOW (ref >60)
GFR calc non Af Amer: 26 mL/min — ABNORMAL LOW (ref >60)
Glucose, Bld: 103 mg/dL — ABNORMAL HIGH (ref 70–99)
Potassium: 3.5 mmol/L (ref 3.5–5.1)
Sodium: 139 mmol/L (ref 135–145)

## 2019-01-27 LAB — GLUCOSE, CAPILLARY
Glucose-Capillary: 76 mg/dL (ref 70–99)
Glucose-Capillary: 93 mg/dL (ref 70–99)
Glucose-Capillary: 93 mg/dL (ref 70–99)
Glucose-Capillary: 82 mg/dL (ref 70–99)

## 2019-01-27 LAB — LAMOTRIGINE LEVEL: Lamotrigine Lvl: 38.8 ug/mL — ABNORMAL HIGH (ref 2.0–20.0)

## 2019-01-27 MED ORDER — LACTATED RINGERS IV SOLN: INTRAVENOUS | Status: DC

## 2019-01-27 MED ORDER — ORAL CARE MOUTH RINSE
15.0000 mL | Freq: Two times a day (BID) | OROMUCOSAL | Status: DC
  Administered 2019-01-27 – 2019-02-01 (×10): 15 mL via OROMUCOSAL

## 2019-01-27 MED ORDER — SODIUM CHLORIDE 0.9 % IV BOLUS
500.0000 mL | Freq: Once | INTRAVENOUS | Status: AC
  Administered 2019-01-27: 500 mL via INTRAVENOUS

## 2019-01-27 MED ORDER — ACETAMINOPHEN 325 MG PO TABS
650.0000 mg | ORAL_TABLET | ORAL | Status: DC | PRN
  Administered 2019-01-27 – 2019-01-30 (×8): 650 mg via ORAL
  Filled 2019-01-27 (×8): qty 2

## 2019-01-27 MED ORDER — LACTATED RINGERS IV SOLN
INTRAVENOUS | Status: AC
  Administered 2019-01-27: 14:00:00 via INTRAVENOUS

## 2019-01-27 NOTE — Progress Notes (Signed)
Daily Progress Note   Patient Name: Angela Fields       Date: 01/27/2019 DOB: 10/31/1950  Age: 68 y.o. MRN#: [id number] Attending Physician: Darlin DropHall, Carole N, DO Primary Care Physician: Marisue BrooklynSaguier, Edward, PA-C Admit Date: 02/01/2019  Reason for Consultation/Follow-up: Establishing goals of care  Subjective: Per RN and chart review, patient remains confused but is much more calm. Ate some applesauce this AM.  Length of Stay: 3  Current Medications: Scheduled Meds:  . aspirin EC  81 mg Oral Daily  . azithromycin  500 mg Oral Q24H  . divalproex  500 mg Oral QHS  . donepezil  10 mg Oral Daily  . enoxaparin (LOVENOX) injection  40 mg Subcutaneous Q24H  . felodipine  5 mg Oral Daily  . folic acid  1 mg Oral Daily  . hydroxychloroquine  200 mg Oral BID  . insulin aspart  0-5 Units Subcutaneous QHS  . insulin aspart  0-9 Units Subcutaneous TID WC  . lamoTRIgine  150 mg Oral BID  . linaclotide  72 mcg Oral QAC breakfast  . mouth rinse  15 mL Mouth Rinse BID  . pravastatin  10 mg Oral QHS  . QUEtiapine  25 mg Oral BID    Continuous Infusions: . lactated ringers 60 mL/hr at 01/27/19 1426    PRN Meds: acetaminophen, LORazepam, ondansetron (ZOFRAN) IV    Vital Signs: BP 122/69 (BP Location: Right Arm)   Pulse 65   Temp 99.5 F (37.5 C) (Oral)   Resp 16   Ht 5\' 5"  (1.651 m)   Wt 68.9 kg   SpO2 94%   BMI 25.29 kg/m  SpO2: SpO2: 94 % O2 Device: O2 Device: Nasal Cannula O2 Flow Rate: O2 Flow Rate (L/min): 2 L/min  Intake/output summary:   Intake/Output Summary (Last 24 hours) at 01/27/2019 2148 Last data filed at 01/27/2019 1800 Gross per 24 hour  Intake 1100 ml  Output 500 ml  Net 600 ml   LBM: Last BM Date: (prior to arrival) Baseline Weight: Weight: 69.4 kg Most recent weight: Weight:  68.9 kg       Palliative Assessment/Data: PPS 20%    Flowsheet Rows     Most Recent Value  Intake Tab  Referral Department  Hospitalist  Unit at Time of Referral  Med/Surg Unit  Palliative Care Primary Diagnosis  Sepsis/Infectious Disease  Date Notified  01/25/19  Palliative Care Type  New Palliative care  Reason for referral  Clarify Goals of Care  Date of Admission  01/24/19  Date first seen by Palliative Care  01/25/19  # of days Palliative referral response time  0 Day(s)  # of days IP prior to Palliative referral  1  Clinical Assessment  Palliative Performance Scale Score  20%  Psychosocial & Spiritual Assessment  Palliative Care Outcomes  Patient/Family meeting held?  Yes  Who was at the meeting?  sister-in-law  Palliative Care Outcomes  Clarified goals of care, Provided psychosocial or spiritual support, Changed CPR status      Patient Active Problem List   Diagnosis Date Noted  . Acute respiratory failure with hypoxia (HCC) 01/25/2019  . COVID-19 virus detected   . Goals of care, counseling/discussion   .  Palliative care by specialist   . CAP (community acquired pneumonia) 01/24/2019  . Hypoxia   . Nausea and vomiting in adult   . Dementia with behavioral disturbance (HCC) 11/24/2018  . Hypoglycemia 07/30/2017  . Cognitive and behavioral changes 06/26/2017  . Seizure (HCC) 06/19/2017  . Status epilepticus (HCC)   . Bradycardia   . Hypokalemia   . Hypertension 09/24/2016  . History of CVA (cerebrovascular accident) 09/24/2016  . 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 Hyperlipidemia 09/24/2016    Palliative Care Assessment & Plan   HPI: 68 y.o. female  with past medical history of advanced dementia, T2DM, anxiety, seizures, and CVA admitted on Feb 03, 2019 with a fall.  Had abrasions on head. CT head and neck negative. In ED, patient developed fever, worsening confusion, and hypoxia. Tested positive for COVID. Of note, husband is  also currently hospitalized d/t COVID. PMT consulted for GOC.  Assessment: Patient less agitated. On 2 L nasal cannula. Concerns about poor PO intake.  Husband remains ill in hospital; therefore, called and spoke with sister-in-law- Angela Fields - per her request to receive daily updates.  Shared update as above. Shared concerns about poor PO intake. We discussed alternative means of nutrition - specifically feeding tube - Gloriajean does not think this is a good idea and tells me if it were up to her she would choose to avoid artificial feeding.  She then tells me that she has recently spoken with patient's husband who is hospitalized and tells me he was doing better - able to participate in conversation. She believes it would be appropriate to speak to him about above issues.  I called into patient's husband's, Angela Fields, hospital room. We reviewed all of my conversations with Eber Jones and reviewed clinical status of his wife. He tells me he agrees with all of Cherilynn's decisions - specifically he agrees that patient should be DNR and she would never want a feeding tube. He elaborates that they both have living wills and have discussed end of life issues. He shares he would not want any aggressive measures for his wife. He agrees to continue current care; however, if patient declines he would want [place] to focus on comfort.   Recommendations/Plan:  Husband has medically improved - now able to participate in goals of care conversations - he agrees with previous decisions made  Husband would like to avoid aggressive medical interventions - no feeding tubes  DNR status  Continue current care conservatively treating current illness but if patient declines, recommend full comfort care  Code Status:  DNR  Prognosis:   Unable to determine  Discharge Planning:  To Be Determined  Care plan was discussed with RN, sister-in-law, patient's spouse, Dr. Margo Aye  Thank you for allowing the Palliative  Medicine Team to assist in the care of this patient.   Total Time 35 minutes Prolonged Time Billed  no    The above conversation was completed via telephone due to the visitor restrictions during the COVID-19 pandemic. Thorough chart review and discussion with necessary members of the care team was completed as part of assessment. All issues were discussed and addressed but no physical exam was performed.     Greater than 50%  of this time was spent counseling and coordinating care related to the above assessment and plan.  Gerlean Ren, [name], [facility] Palliative Medicine Team Team Phone # [phone]  Pager [phone]

## 2019-01-27 NOTE — Progress Notes (Signed)
Paged [facility] on call Schorr, via amion to make them aware of axillary temperature of 103.7 with no antipyretics ordered.

## 2019-01-27 NOTE — Progress Notes (Signed)
Schorr, NP placed order for tylenol for fever and normal saline bolus.

## 2019-01-27 NOTE — Care Management Important Message (Addendum)
Important Message  Patient Details  Name: Angela Fields MRN: [id number] Date of Birth: 1951-09-02   Medicare Important Message Given:  Yes Also sent to [name] by email at [email or web address] and copy given to spouse [name] suggested by [name].   Leone Haven, RN 01/27/2019, 9:58 AM

## 2019-01-27 NOTE — Progress Notes (Signed)
PROGRESS NOTE  Angela DohenyCarolyn Fields WUJ:[id number] DOB: May 01, 1951 DOA: 2019-06-18 PCP: Esperanza RichtersSaguier, Edward, PA-C  HPI/Recap of past 24 hours: Patient is a 68 year old female with severe dementia, diabetes mellitus type 2, anxiety, history of seizures, stroke presented after she was found fallen by a bystander and presented to ED on 4/11 for abrasions around her head.  CT head and neck at that time did not show acute issues.  Unfortunately no family was able to be found and patient remained in ED for 24 hours.  In ED, patient was found to have worsening of the symptoms with more confusion, hypoxia, temperature 100.3 F and chest x-ray changes, patient was admitted for COVID 19 rule out.  Unfortunately her husband is also admitted in the hospital with the COVID-19 (positive) pneumonia.  COVID-19 positive  01/27/19: Patient seen and examined at her bedside.  She is alert but confused in the setting of advanced dementia.  She has no complaints and does not appear to be in distress.  Assessment/Plan: Principal Problem:   Acute respiratory failure with hypoxia (HCC) Active Problems:   Hypertension   Hyperlipidemia   Seizure (HCC)   Dementia with behavioral disturbance (HCC)   CAP (community acquired pneumonia)   COVID-19 virus detected   Goals of care, counseling/discussion   Palliative care by specialist  Acute hypoxic respiratory failure secondary to COVID-19 pneumonia Presented with fever, hypoxia, pulmonary infiltrates with non-COVID-19 exposure Tested positive and COVID-19 testing T-max 103.7 overnight Negative blood cultures x2 for 3 days Palliative care consulted given severe dementia to establish goals of care Continue Zithromax, Plaquenil Obtain daily twelve-lead EKG to monitor QTC Maintain O2 saturation greater than 92%  AKI Creatinine jumped from 1.01 yesterday to 1.91 this morning GFR of 30 from greater than 60 Start gentle IV fluid hydration LR at 60 cc/h Closely monitor urine  output and respiratory status Avoid nephrotoxic agents Repeat BMP in the morning  Hyperammonemia Ammonia level 53 No known history of liver disease Repeat ammonia level in the morning treat if trends up Obtain LFTs in the morning   Transient sinus bradycardia Unclear etiology Now back to normal sinus rhythm     Code Status: DNR  Family Communication: None at bedside  Disposition Plan: Home possibly with palliative care or hospice   Consultants:  Palliative care team  Procedures:  None  Antimicrobials:  Azithromycin  DVT prophylaxis: Subcu Lovenox daily   Objective: Vitals:   01/27/19 0400 01/27/19 0413 01/27/19 0615 01/27/19 0800  BP:    122/69  Pulse:    65  Resp: 16     Temp: (!) 100.8 F (38.2 C)  (!) 100.7 F (38.2 C) 99.5 F (37.5 C)  TempSrc: Axillary  Axillary Oral  SpO2: (!) 88% 93%  94%  Weight:      Height:        Intake/Output Summary (Last 24 hours) at 01/27/2019 1306 Last data filed at 01/27/2019 0900 Gross per 24 hour  Intake 500 ml  Output 200 ml  Net 300 ml   Filed Weights   March 29, 2019 1403 01/24/19 1002 01/24/19 2137  Weight: 69.4 kg 69.4 kg 68.9 kg    Exam:  . General: 68 y.o. year-old female well developed well nourished in no acute distress.  Alert and pleasantly demented. . Cardiovascular: Regular rate and rhythm with no rubs or gallops.  No thyromegaly or JVD noted.   Marland Kitchen. Respiratory: Mild rales at bases with no wheezes noted.  Poor inspiratory effort. . Abdomen: Soft nontender nondistended  with normal bowel sounds x4 quadrants. . Musculoskeletal: No lower extremity edema. 2/4 pulses in all 4 extremities. Marland Kitchen Psychiatry: Mood is appropriate for condition and setting   Data Reviewed: CBC: Recent Labs  Lab 02-19-19 1506 01/24/19 1521 01/24/19 1535 01/25/19 0744 01/26/19 0514 01/27/19 0433  WBC 2.8* 3.4*  --  3.6* 4.7 4.0  NEUTROABS 1.9 2.4  --   --   --   --   HGB 11.8* 12.8 13.6 13.3 13.8 12.8  HCT 37.5 41.7 40.0  41.9 42.4 41.0  MCV 93.5 92.1  --  90.7 88.1 92.1  PLT 162 144*  --  126* 92* 125*   Basic Metabolic Panel: Recent Labs  Lab 02/19/19 1506 01/24/19 1521 01/24/19 1535 01/25/19 0744 01/26/19 0514 01/27/19 0433  NA 138 137 136 138 138 139  K 3.4* 4.1 3.8 3.6 4.4 3.5  CL 102 98  --  99 97* 100  CO2 27 29  --  23 27 28   GLUCOSE 92 92  --  94 99 103*  BUN 10 8  --  9 11 18   CREATININE 1.05* 0.92  --  1.04* 1.01* 1.91*  CALCIUM 8.2* 8.5*  --  8.6* 8.3* 8.1*   GFR: Estimated Creatinine Clearance: 26.7 mL/min (A) (by C-G formula based on SCr of 1.91 mg/dL (H)). Liver Function Tests: Recent Labs  Lab 01/24/19 1521  AST 54*  ALT 35  ALKPHOS 73  BILITOT 0.9  PROT 6.9  ALBUMIN 3.7   No results for input(s): LIPASE, AMYLASE in the last 168 hours. Recent Labs  Lab 01/26/19 1205  AMMONIA 53*   Coagulation Profile: No results for input(s): INR, PROTIME in the last 168 hours. Cardiac Enzymes: No results for input(s): CKTOTAL, CKMB, CKMBINDEX, TROPONINI in the last 168 hours. BNP (last 3 results) No results for input(s): PROBNP in the last 8760 hours. HbA1C: No results for input(s): HGBA1C in the last 72 hours. CBG: Recent Labs  Lab 01/26/19 1703 01/26/19 2038 01/26/19 2325 01/27/19 0858 01/27/19 1246  GLUCAP 73 69* 92 76 93   Lipid Profile: No results for input(s): CHOL, HDL, LDLCALC, TRIG, CHOLHDL, LDLDIRECT in the last 72 hours. Thyroid Function Tests: No results for input(s): TSH, T4TOTAL, FREET4, T3FREE, THYROIDAB in the last 72 hours. Anemia Panel: Recent Labs    01/25/19 1046  FERRITIN 154   Urine analysis:    Component Value Date/Time   COLORURINE STRAW (A) 01/24/2019 2028   APPEARANCEUR CLEAR 01/24/2019 2028   LABSPEC 1.005 01/24/2019 2028   PHURINE 8.0 01/24/2019 2028   GLUCOSEU NEGATIVE 01/24/2019 2028   GLUCOSEU NEGATIVE 09/02/2018 1042   HGBUR SMALL (A) 01/24/2019 2028   BILIRUBINUR NEGATIVE 01/24/2019 2028   BILIRUBINUR Negative 09/24/2018  1010   KETONESUR 20 (A) 01/24/2019 2028   PROTEINUR NEGATIVE 01/24/2019 2028   UROBILINOGEN negative (A) 09/24/2018 1010   UROBILINOGEN 0.2 09/02/2018 1042   NITRITE NEGATIVE 01/24/2019 2028   LEUKOCYTESUR NEGATIVE 01/24/2019 2028   Sepsis Labs: @LABRCNTIP (procalcitonin:4,lacticidven:4)  ) Recent Results (from the past 240 hour(s))  SARS Coronavirus 2 [facility] order, Performed in [facility] hospital lab)     Status: Abnormal   Collection Time: 01/24/19  9:46 AM  Result Value Ref Range Status   SARS Coronavirus 2 POSITIVE (A) NEGATIVE Final    Comment: RESULT CALLED TO, READ BACK BY AND VERIFIED WITH: RN Shelia Media [id number] MLM (NOTE) If result is NEGATIVE SARS-CoV-2 target nucleic acids are NOT DETECTED. The SARS-CoV-2 RNA is generally detectable in upper  and lower  respiratory specimens during the acute phase of infection. The lowest  concentration of SARS-CoV-2 viral copies this assay can detect is 250  copies / mL. A negative result does not preclude SARS-CoV-2 infection  and should not be used as the sole basis for treatment or other  patient management decisions.  A negative result may occur with  improper specimen collection / handling, submission of specimen other  than nasopharyngeal swab, presence of viral mutation(s) within the  areas targeted by this assay, and inadequate number of viral copies  (<250 copies / mL). A negative result must be combined with clinical  observations, patient history, and epidemiological information. If result is POSITIVE SARS-CoV-2 target nucleic acids are DETECTED. The SARS- CoV-2 RNA is generally detectable in upper and lower  respiratory specimens during the acute phase of infection.  Positive  results are indicative of active infection with SARS-CoV-2.  Clinical  correlation with patient history and other diagnostic information is  necessary to determine patient infection status.  Positive results do  not rule out bacterial  infection or co-infection with other viruses. If result is PRESUMPTIVE POSTIVE SARS-CoV-2 nucleic acids MAY BE PRESENT.   A presumptive positive result was obtained on the submitted specimen  and confirmed on repeat testing.  While 2019 novel coronavirus  (SARS-CoV-2) nucleic acids may be present in the submitted sample  additional confirmatory testing may be necessary for epidemiological  and / or clinical management purposes  to differentiate between  SARS-CoV-2 and other Sarbecovirus currently known to infect humans.  If clinically indicated additional testing with an alternate test  methodology [phone]) is advised . The SARS-CoV-2 RNA is generally  detectable in upper and lower respiratory specimens during the acute  phase of infection. The expected result is Negative. Fact Sheet for Patients:  BoilerBrush.com.cy Fact Sheet for Healthcare Providers: https://pope.com/ This test is not yet approved or cleared by the [place] FDA and has been authorized for detection and/or diagnosis of SARS-CoV-2 by FDA under an Emergency Use Authorization (EUA).  This EUA will remain in effect (meaning this test can be used) for the duration of the COVID-19 declaration under Section 564(b)(1) of the Act, 21 U.S.C. section 360bbb-3(b)(1), unless the authorization is terminated or revoked sooner. Performed at [facility], [address] [place], [place] [address]   Blood Culture (routine x 2)     Status: None (Preliminary result)   Collection Time: 01/24/19  3:19 PM  Result Value Ref Range Status   Specimen Description BLOOD SITE NOT SPECIFIED  Final   Special Requests   Final    BOTTLES DRAWN AEROBIC ONLY Blood Culture adequate volume   Culture   Final    NO GROWTH 3 DAYS Performed at [facility], [address], [place], [place] [address]    Report Status PENDING  Incomplete  Blood Culture (routine x 2)     Status: None  (Preliminary result)   Collection Time: 01/24/19  3:27 PM  Result Value Ref Range Status   Specimen Description BLOOD WRIST RIGHT  Final   Special Requests   Final    BOTTLES DRAWN AEROBIC AND ANAEROBIC Blood Culture adequate volume   Culture   Final    NO GROWTH 3 DAYS Performed at [facility], [address] [place], [place] [address]    Report Status PENDING  Incomplete      Studies: No results found.  Scheduled Meds: . aspirin EC  81 mg  Oral Daily  . azithromycin  500 mg Oral Q24H  . divalproex  500 mg Oral QHS  . donepezil  10 mg Oral Daily  . enoxaparin (LOVENOX) injection  40 mg Subcutaneous Q24H  . felodipine  5 mg Oral Daily  . folic acid  1 mg Oral Daily  . hydroxychloroquine  200 mg Oral BID  . insulin aspart  0-5 Units Subcutaneous QHS  . insulin aspart  0-9 Units Subcutaneous TID WC  . lamoTRIgine  150 mg Oral BID  . linaclotide  72 mcg Oral QAC breakfast  . mouth rinse  15 mL Mouth Rinse BID  . pravastatin  10 mg Oral QHS  . QUEtiapine  25 mg Oral BID    Continuous Infusions:   LOS: 3 days     Darlin Drop, MD [facility] Pager [phone]  If 7PM-7AM, please contact night-coverage [email or web address] Password [email or web address] 01/27/2019, 1:06 PM

## 2019-01-27 NOTE — Progress Notes (Signed)
Temperature remains elevated 103 axillary.  Sheet only on patient and gown.  Just started NS bolus since new IV was just placed by IV team.  Will reassess temperature.

## 2019-01-28 ENCOUNTER — Telehealth: Payer: Self-pay | Admitting: Pulmonary Disease

## 2019-01-28 LAB — CBC
HCT: 39.6 % (ref 36.0–46.0)
Hemoglobin: 12.2 g/dL (ref 12.0–15.0)
MCH: 28.4 pg (ref 26.0–34.0)
MCHC: 30.8 g/dL (ref 30.0–36.0)
MCV: 92.1 fL (ref 80.0–100.0)
Platelets: 162 10*3/uL (ref 150–400)
RBC: 4.3 MIL/uL (ref 3.87–5.11)
RDW: 14.4 % (ref 11.5–15.5)
WBC: 5.4 10*3/uL (ref 4.0–10.5)
nRBC: 0 % (ref 0.0–0.2)

## 2019-01-28 LAB — BASIC METABOLIC PANEL
Anion gap: 11 (ref 5–15)
BUN: 23 mg/dL (ref 8–23)
CO2: 31 mmol/L (ref 22–32)
Calcium: 8.2 mg/dL — ABNORMAL LOW (ref 8.9–10.3)
Chloride: 100 mmol/L (ref 98–111)
Creatinine, Ser: 1.29 mg/dL — ABNORMAL HIGH (ref 0.44–1.00)
GFR calc Af Amer: 49 mL/min — ABNORMAL LOW (ref >60)
GFR calc non Af Amer: 42 mL/min — ABNORMAL LOW (ref >60)
Glucose, Bld: 93 mg/dL (ref 70–99)
Potassium: 3.8 mmol/L (ref 3.5–5.1)
Sodium: 142 mmol/L (ref 135–145)

## 2019-01-28 LAB — GLUCOSE, CAPILLARY
Glucose-Capillary: 74 mg/dL (ref 70–99)
Glucose-Capillary: 77 mg/dL (ref 70–99)
Glucose-Capillary: 85 mg/dL (ref 70–99)
Glucose-Capillary: 89 mg/dL (ref 70–99)

## 2019-01-28 MED ORDER — QUETIAPINE FUMARATE 25 MG PO TABS
25.0000 mg | ORAL_TABLET | Freq: Two times a day (BID) | ORAL | Status: DC
  Filled 2019-01-28: qty 1

## 2019-01-28 MED ORDER — DEXTROSE-NACL 5-0.45 % IV SOLN
INTRAVENOUS | Status: DC
  Administered 2019-01-28: 19:00:00 via INTRAVENOUS

## 2019-01-28 MED ORDER — LORAZEPAM 2 MG/ML IJ SOLN: 0.5000 mg | Freq: Two times a day (BID) | INTRAMUSCULAR | Status: DC | PRN

## 2019-01-28 MED ORDER — CLONAZEPAM 0.5 MG PO TABS: 0.2500 mg | ORAL_TABLET | Freq: Two times a day (BID) | ORAL | Status: DC | PRN

## 2019-01-28 MED ORDER — ALBUTEROL SULFATE HFA 108 (90 BASE) MCG/ACT IN AERS
2.0000 | INHALATION_SPRAY | Freq: Four times a day (QID) | RESPIRATORY_TRACT | Status: DC
  Administered 2019-01-28 (×2): 2 via RESPIRATORY_TRACT
  Filled 2019-01-28 (×2): qty 6.7

## 2019-01-28 MED ORDER — HALOPERIDOL LACTATE 5 MG/ML IJ SOLN
2.5000 mg | Freq: Once | INTRAMUSCULAR | Status: AC
  Administered 2019-01-28: 2.5 mg via INTRAVENOUS
  Filled 2019-01-28: qty 1

## 2019-01-28 MED ORDER — CLONAZEPAM 0.25 MG PO TBDP
0.2500 mg | ORAL_TABLET | Freq: Two times a day (BID) | ORAL | Status: DC | PRN
  Filled 2019-01-28: qty 1

## 2019-01-28 NOTE — Progress Notes (Addendum)
Nutrition Brief Note  RD working remotely  Pt identified on the </= 12 Braden Score report. Admitted with hypoxic respiratory failure; + COVID-19. Palliative Medicine Team following; 4/15 note reviewed.  Pt's family would not want feeding tube placement. No nutrition interventions warranted at this time.  Please consult as needed.   Maureen Chatters, RD, LDN Pager #: [phone] After-Hours Pager #: [phone]

## 2019-01-28 NOTE — Telephone Encounter (Signed)
I called her husband [name] to let him know that we have identified both he and his wife as candidates for transfer to [facility].  He voiced understanding.

## 2019-01-28 NOTE — Progress Notes (Signed)
PROGRESS NOTE  Angela Fields WUJ:[id number] DOB: 27-Mar-1951 DOA: 01/19/2019 PCP: Esperanza Richters, PA-C  HPI/Recap of past 24 hours: Patient is a 68 year old female with severe dementia, diabetes mellitus type 2, anxiety, history of seizures, stroke presented after she was found fallen by a bystander and presented to ED on 4/11 for abrasions around her head.  CT head and neck at that time did not show acute issues.  Unfortunately no family was able to be found and patient remained in ED for 24 hours.  In ED, patient was found to have worsening of the symptoms with more confusion, hypoxia, temperature 100.3 F and chest x-ray changes, patient was admitted for COVID 19 rule out.  Unfortunately her husband is also admitted in the hospital with the COVID-19 (positive) pneumonia.  COVID-19 positive  01/28/19: Patient seen and examined at her bedside.  Reported agitation overnight.  Was given 1 dose of IV Ativan 0.5 mg.  This morning she is lethargic but arousable to voices.  Febrile this morning with T-max of 102.2, O2 saturation 96% on 3 L with respiration rate of 24.  Will DC soft restraints, add on telesitter for patient's own safety, continuous pulse oximetry, and closely monitor.  Assessment/Plan: Principal Problem:   Acute respiratory failure with hypoxia (HCC) Active Problems:   Hypertension   Hyperlipidemia   Seizure (HCC)   Dementia with behavioral disturbance (HCC)   CAP (community acquired pneumonia)   COVID-19 virus detected   Goals of care, counseling/discussion   Palliative care by specialist  Acute hypoxic respiratory failure secondary to COVID-19 pneumonia Presented with fever, hypoxia, pulmonary infiltrates with positive covid-19 testing T-max 102.2 overnight Negative blood cultures x2 for 3 days Palliative care following given severe dementia, now DNR Continue Zithromax, Plaquenil Normal QTC 422 Continue to obtain daily twelve-lead EKG to monitor QTC Maintain O2  saturation greater than 92% Start continuous pulse oximetry due to lethargy  COVID-19 pneumonia Persistent fevers noted T-max 102.2 overnight Management as stated above Independently reviewed chest x-ray done on 01/24/2019 which reveals bilateral lower lobe infiltrates indicative of viral pneumonia Last O2 saturation 94% on 3 L Elevated LDH, CRP Repeat markers tomorrow 01/29/2019 Blood cultures x2- to date  Acute metabolic encephalopathy in the setting of advanced dementia Agitation reported overnight On soft restraints which will be discontinued due to lethargy and high-grade fever Received 1 dose of IV Ativan 0.5 mg Telesitter ordered for patient's own safety Continue close monitoring Reorient as indicated  Improving AKI Creatinine improved from 1.91 to 1.29 today after IV fluid administration Continue to monitor urine output Encourage increase oral fluid intake when more alert Repeat BMP in the morning Avoid nephrotoxic agents  Hyperammonemia Ammonia level 53 No known history of liver disease Obtain ammonia level and LFTs in the morning  Resolved transient sinus bradycardia Unclear etiology Now back to normal sinus rhythm  Dementia with behavioral disturbance On Klonopin 0.5 mg twice daily as needed at home and Seroquel 25 mg twice daily Resume Klonopin tomorrow 0.25 mg twice daily as needed for anxiety Hold off Klonopin and Seroquel today due to lethargy  Seizure disorder Per husband last known seizure activity was 3 months ago Continue Depakote and lamotrigine  IBS with the patient predominance Resume Linzess      Code Status: DNR  Family Communication: Updated her husband on 01/28/2019  Disposition Plan: Home possibly in 2 to 3 days when hemodynamically stable.   Consultants:  Palliative care team  Procedures:  None  Antimicrobials:  Azithromycin  DVT prophylaxis: Subcu Lovenox daily   Objective: Vitals:   01/27/19 0615 01/27/19 0800  01/27/19 2257 01/28/19 0016  BP:  122/69 (!) 146/65   Pulse:  65 79   Resp:   18   Temp: (!) 100.7 F (38.2 C) 99.5 F (37.5 C) (!) 102.2 F (39 C) (!) 100.9 F (38.3 C)  TempSrc: Axillary Oral Axillary Axillary  SpO2:  94% 94%   Weight:      Height:        Intake/Output Summary (Last 24 hours) at 01/28/2019 0830 Last data filed at 01/27/2019 1800 Gross per 24 hour  Intake 600 ml  Output 300 ml  Net 300 ml   Filed Weights   10-Feb-2019 1403 01/24/19 1002 01/24/19 2137  Weight: 69.4 kg 69.4 kg 68.9 kg    Exam:  . General: 68 y.o. year-old female well developed well nourished lethargic, arousable to voices. . Cardiovascular: Regular rate and rhythm with no rubs or gallops.  No JVD or thyromegaly noted. Marland Kitchen Respiratory: Mild rales at bases with no wheezes noted.  Poor inspiratory effort. . Abdomen: Soft nontender nondistended with normal bowel sounds x4 quadrants. . Musculoskeletal: No lower extremity edema. 2/4 pulses in all 4 extremities. Marland Kitchen Psychiatry: Unable to assess mood due to lethargy.   Data Reviewed: CBC: Recent Labs  Lab 10-Feb-2019 1506 01/24/19 1521 01/24/19 1535 01/25/19 0744 01/26/19 0514 01/27/19 0433 01/28/19 0438  WBC 2.8* 3.4*  --  3.6* 4.7 4.0 5.4  NEUTROABS 1.9 2.4  --   --   --   --   --   HGB 11.8* 12.8 13.6 13.3 13.8 12.8 12.2  HCT 37.5 41.7 40.0 41.9 42.4 41.0 39.6  MCV 93.5 92.1  --  90.7 88.1 92.1 92.1  PLT 162 144*  --  126* 92* 125* 162   Basic Metabolic Panel: Recent Labs  Lab 01/24/19 1521 01/24/19 1535 01/25/19 0744 01/26/19 0514 01/27/19 0433 01/28/19 0438  NA 137 136 138 138 139 142  K 4.1 3.8 3.6 4.4 3.5 3.8  CL 98  --  99 97* 100 100  CO2 29  --  GLUCOSE 92  --  94 99 103* 93  BUN 8  --  CREATININE 0.92  --  1.04* 1.01* 1.91* 1.29*  CALCIUM 8.5*  --  8.6* 8.3* 8.1* 8.2*   GFR: Estimated Creatinine Clearance: 39.6 mL/min (A) (by C-G formula based on SCr of 1.29 mg/dL (H)). Liver Function Tests:  Recent Labs  Lab 01/24/19 1521  AST 54*  ALT 35  ALKPHOS 73  BILITOT 0.9  PROT 6.9  ALBUMIN 3.7   No results for input(s): LIPASE, AMYLASE in the last 168 hours. Recent Labs  Lab 01/26/19 1205  AMMONIA 53*   Coagulation Profile: No results for input(s): INR, PROTIME in the last 168 hours. Cardiac Enzymes: No results for input(s): CKTOTAL, CKMB, CKMBINDEX, TROPONINI in the last 168 hours. BNP (last 3 results) No results for input(s): PROBNP in the last 8760 hours. HbA1C: No results for input(s): HGBA1C in the last 72 hours. CBG: Recent Labs  Lab 01/27/19 0858 01/27/19 1246 01/27/19 1647 01/27/19 2148 01/28/19 0820  GLUCAP 76 93 93 82 74   Lipid Profile: No results for input(s): CHOL, HDL, LDLCALC, TRIG, CHOLHDL, LDLDIRECT in the last 72 hours. Thyroid Function Tests: No results for input(s): TSH, T4TOTAL, FREET4, T3FREE, THYROIDAB in the last 72 hours. Anemia Panel: Recent Labs    01/25/19 1046  FERRITIN 154   Urine analysis:    Component Value Date/Time   COLORURINE STRAW (A) 01/24/2019 2028   APPEARANCEUR CLEAR 01/24/2019 2028   LABSPEC 1.005 01/24/2019 2028   PHURINE 8.0 01/24/2019 2028   GLUCOSEU NEGATIVE 01/24/2019 2028   GLUCOSEU NEGATIVE 09/02/2018 1042   HGBUR SMALL (A) 01/24/2019 2028   BILIRUBINUR NEGATIVE 01/24/2019 2028   BILIRUBINUR Negative 09/24/2018 1010   KETONESUR 20 (A) 01/24/2019 2028   PROTEINUR NEGATIVE 01/24/2019 2028   UROBILINOGEN negative (A) 09/24/2018 1010   UROBILINOGEN 0.2 09/02/2018 1042   NITRITE NEGATIVE 01/24/2019 2028   LEUKOCYTESUR NEGATIVE 01/24/2019 2028   Sepsis Labs: @LABRCNTIP (procalcitonin:4,lacticidven:4)  ) Recent Results (from the past 240 hour(s))  SARS Coronavirus 2 [facility] order, Performed in [facility] hospital lab)     Status: Abnormal   Collection Time: 01/24/19  9:46 AM  Result Value Ref Range Status   SARS Coronavirus 2 POSITIVE (A) NEGATIVE Final    Comment: RESULT CALLED TO, READ BACK BY  AND VERIFIED WITH: RN Shelia Media [id number] MLM (NOTE) If result is NEGATIVE SARS-CoV-2 target nucleic acids are NOT DETECTED. The SARS-CoV-2 RNA is generally detectable in upper and lower  respiratory specimens during the acute phase of infection. The lowest  concentration of SARS-CoV-2 viral copies this assay can detect is 250  copies / mL. A negative result does not preclude SARS-CoV-2 infection  and should not be used as the sole basis for treatment or other  patient management decisions.  A negative result may occur with  improper specimen collection / handling, submission of specimen other  than nasopharyngeal swab, presence of viral mutation(s) within the  areas targeted by this assay, and inadequate number of viral copies  (<250 copies / mL). A negative result must be combined with clinical  observations, patient history, and epidemiological information. If result is POSITIVE SARS-CoV-2 target nucleic acids are DETECTED. The SARS- CoV-2 RNA is generally detectable in upper and lower  respiratory specimens during the acute phase of infection.  Positive  results are indicative of active infection with SARS-CoV-2.  Clinical  correlation with patient history and other diagnostic information is  necessary to determine patient infection status.  Positive results do  not rule out bacterial infection or co-infection with other viruses. If result is PRESUMPTIVE POSTIVE SARS-CoV-2 nucleic acids MAY BE PRESENT.   A presumptive positive result was obtained on the submitted specimen  and confirmed on repeat testing.  While 2019 novel coronavirus  (SARS-CoV-2) nucleic acids may be present in the submitted sample  additional confirmatory testing may be necessary for epidemiological  and / or clinical management purposes  to differentiate between  SARS-CoV-2 and other Sarbecovirus currently known to infect humans.  If clinically indicated additional testing with an alternate test   methodology [phone]) is advised . The SARS-CoV-2 RNA is generally  detectable in upper and lower respiratory specimens during the acute  phase of infection. The expected result is Negative. Fact Sheet for Patients:  BoilerBrush.com.cy Fact Sheet for Healthcare Providers: https://pope.com/ This test is not yet approved or cleared by the [place] FDA and has been authorized for detection and/or diagnosis of SARS-CoV-2 by FDA under an Emergency Use Authorization (EUA).  This EUA will remain in effect (meaning this test can be used) for the duration of the COVID-19 declaration under Section 564(b)(1) of the Act, 21 U.S.C. section 360bbb-3(b)(1), unless the authorization is terminated or revoked sooner. Performed at [facility], [address], [place], [place]  [id number]   Blood Culture (routine x 2)     Status: None (Preliminary result)   Collection Time: 01/24/19  3:19 PM  Result Value Ref Range Status   Specimen Description BLOOD SITE NOT SPECIFIED  Final   Special Requests   Final    BOTTLES DRAWN AEROBIC ONLY Blood Culture adequate volume   Culture   Final    NO GROWTH 3 DAYS Performed at [facility], [address] [place], [place] [address]    Report Status PENDING  Incomplete  Blood Culture (routine x 2)     Status: None (Preliminary result)   Collection Time: 01/24/19  3:27 PM  Result Value Ref Range Status   Specimen Description BLOOD WRIST RIGHT  Final   Special Requests   Final    BOTTLES DRAWN AEROBIC AND ANAEROBIC Blood Culture adequate volume   Culture   Final    NO GROWTH 3 DAYS Performed at [facility], [address] [place], [place] [address]    Report Status PENDING  Incomplete      Studies: No results found.  Scheduled Meds: . aspirin EC  81 mg Oral Daily  . azithromycin  500 mg Oral Q24H  . divalproex  500 mg Oral QHS  . donepezil  10 mg Oral Daily  . enoxaparin (LOVENOX)  injection  40 mg Subcutaneous Q24H  . felodipine  5 mg Oral Daily  . folic acid  1 mg Oral Daily  . hydroxychloroquine  200 mg Oral BID  . insulin aspart  0-5 Units Subcutaneous QHS  . insulin aspart  0-9 Units Subcutaneous TID WC  . lamoTRIgine  150 mg Oral BID  . linaclotide  72 mcg Oral QAC breakfast  . mouth rinse  15 mL Mouth Rinse BID  . pravastatin  10 mg Oral QHS  . [START ON 01/29/2019] QUEtiapine  25 mg Oral BID    Continuous Infusions: . lactated ringers 60 mL/hr at 01/27/19 1426     LOS: 4 days     Darlin Droparole N Glee Lashomb, MD [facility] Pager [phone]  If 7PM-7AM, please contact night-coverage [email or web address] Password [email or web address] 01/28/2019, 8:30 AM

## 2019-01-29 ENCOUNTER — Inpatient Hospital Stay (HOSPITAL_COMMUNITY): Payer: Medicare HMO

## 2019-01-29 LAB — COMPREHENSIVE METABOLIC PANEL
ALT: 107 U/L — ABNORMAL HIGH (ref 0–44)
AST: 273 U/L — ABNORMAL HIGH (ref 15–41)
Albumin: 2.5 g/dL — ABNORMAL LOW (ref 3.5–5.0)
Alkaline Phosphatase: 61 U/L (ref 38–126)
Anion gap: 8 (ref 5–15)
BUN: 12 mg/dL (ref 8–23)
CO2: 35 mmol/L — ABNORMAL HIGH (ref 22–32)
Calcium: 8.2 mg/dL — ABNORMAL LOW (ref 8.9–10.3)
Chloride: 102 mmol/L (ref 98–111)
Creatinine, Ser: 1.01 mg/dL — ABNORMAL HIGH (ref 0.44–1.00)
GFR calc Af Amer: >60 mL/min (ref >60)
GFR calc non Af Amer: 56 mL/min — ABNORMAL LOW (ref >60)
Glucose, Bld: 106 mg/dL — ABNORMAL HIGH (ref 70–99)
Potassium: 3.5 mmol/L (ref 3.5–5.1)
Sodium: 145 mmol/L (ref 135–145)
Total Bilirubin: 0.6 mg/dL (ref 0.3–1.2)
Total Protein: 5.8 g/dL — ABNORMAL LOW (ref 6.5–8.1)

## 2019-01-29 LAB — GLUCOSE, CAPILLARY
Glucose-Capillary: 79 mg/dL (ref 70–99)
Glucose-Capillary: 81 mg/dL (ref 70–99)
Glucose-Capillary: 73 mg/dL (ref 70–99)

## 2019-01-29 LAB — CULTURE, BLOOD (ROUTINE X 2)
Culture: NO GROWTH
Culture: NO GROWTH
Special Requests: ADEQUATE
Special Requests: ADEQUATE

## 2019-01-29 LAB — C-REACTIVE PROTEIN: CRP: 18.8 mg/dL — ABNORMAL HIGH (ref <1.0)

## 2019-01-29 LAB — PROCALCITONIN: Procalcitonin: 0.3 ng/mL

## 2019-01-29 LAB — LACTATE DEHYDROGENASE: LDH: 1078 U/L — ABNORMAL HIGH (ref 98–192)

## 2019-01-29 LAB — AMMONIA: Ammonia: 44 umol/L — ABNORMAL HIGH (ref 9–35)

## 2019-01-29 LAB — MRSA PCR SCREENING: MRSA by PCR: NEGATIVE

## 2019-01-29 MED ORDER — LORAZEPAM 2 MG/ML IJ SOLN
INTRAMUSCULAR | Status: AC
  Administered 2019-01-29: 1 mg via INTRAVENOUS
  Filled 2019-01-29: qty 1

## 2019-01-29 MED ORDER — LORAZEPAM 2 MG/ML IJ SOLN
1.0000 mg | Freq: Once | INTRAMUSCULAR | Status: AC
  Administered 2019-01-29: 1 mg via INTRAVENOUS

## 2019-01-29 MED ORDER — VANCOMYCIN HCL 10 G IV SOLR
1250.0000 mg | INTRAVENOUS | Status: DC
  Administered 2019-01-29: 1250 mg via INTRAVENOUS
  Filled 2019-01-29 (×2): qty 1250

## 2019-01-29 MED ORDER — PIPERACILLIN-TAZOBACTAM 3.375 G IVPB
3.3750 g | Freq: Three times a day (TID) | INTRAVENOUS | Status: DC
  Administered 2019-01-29: 3.375 g via INTRAVENOUS
  Filled 2019-01-29 (×4): qty 50

## 2019-01-29 MED ORDER — PIPERACILLIN-TAZOBACTAM 3.375 G IVPB
3.3750 g | Freq: Three times a day (TID) | INTRAVENOUS | Status: DC
  Administered 2019-01-29 – 2019-01-30 (×2): 3.375 g via INTRAVENOUS
  Filled 2019-01-29 (×3): qty 50

## 2019-01-29 MED ORDER — CLONAZEPAM 0.25 MG PO TBDP: 0.2500 mg | ORAL_TABLET | Freq: Two times a day (BID) | ORAL | Status: DC | PRN

## 2019-01-29 MED ORDER — QUETIAPINE FUMARATE 25 MG PO TABS
25.0000 mg | ORAL_TABLET | Freq: Two times a day (BID) | ORAL | Status: DC
  Administered 2019-01-30 – 2019-02-01 (×2): 25 mg via ORAL
  Filled 2019-01-29 (×7): qty 1

## 2019-01-29 MED ORDER — ALBUTEROL SULFATE HFA 108 (90 BASE) MCG/ACT IN AERS
2.0000 | INHALATION_SPRAY | RESPIRATORY_TRACT | Status: DC | PRN
  Administered 2019-02-01: 2 via RESPIRATORY_TRACT
  Filled 2019-01-29: qty 6.7

## 2019-01-29 MED ORDER — CLONAZEPAM 0.5 MG PO TBDP: 0.5000 mg | ORAL_TABLET | Freq: Two times a day (BID) | ORAL | Status: DC | PRN

## 2019-01-29 MED ORDER — FUROSEMIDE 10 MG/ML IJ SOLN
40.0000 mg | Freq: Once | INTRAMUSCULAR | Status: AC
  Administered 2019-01-29: 18:00:00 40 mg via INTRAVENOUS
  Filled 2019-01-29: qty 4

## 2019-01-29 MED ORDER — CLONAZEPAM 0.5 MG PO TBDP
0.5000 mg | ORAL_TABLET | Freq: Two times a day (BID) | ORAL | Status: DC | PRN
  Administered 2019-01-30: 0.5 mg via ORAL
  Filled 2019-01-29 (×2): qty 1

## 2019-01-29 NOTE — Progress Notes (Signed)
Patient was seen and examined after being transferred to [facility]. Hypoxia has worsened-she is now requiring 100% nonrebreather mask to maintain O2 saturations.  She still remains very pleasantly confused.  On exam: Chest: Fine rales both lungs Extremity: Trace/1+ edema  Assessment/plan Worsening acute hypoxic respiratory failure: Secondary to COVID-19 viral pneumonia-with probable some component of aspiration.  Will continue Vanco/Zosyn-1 dose of Lasix.  SLP evaluation.  Would keep n.p.o. for now.  Her prognosis is extremely grim-spoke with the patient's husband (who actually was at [facility] earlier this morning and signed out AMA)-he is aware of poor overall prognosis-and that we are providing maximal supportive care.  He is also aware that if she deteriorates any further-we will transition her to comfort measures.  DNR remains in place.

## 2019-01-29 NOTE — Progress Notes (Addendum)
Pharmacy Antibiotic Note  Angela Fields is a 68 y.o. female admitted on 20-Feb-2019 with pneumonia.  Pharmacy has been consulted for vanc/zosyn dosing.  Pt is currently being treated for COVID here with HCQ/azith. Her hypoxemia has worsened so vanc/zosyn were added. MRSA PCR in place to narrow therapy. Her scr has trended down to baseline 1.   Plan: Vanc 1.25g IV q24>>AUC 490, scr 1 Zosyn 3.375g IV q8 Level as needed F/u with MRSA PCR  Height: 5\' 5"  (165.1 cm) Weight: 152 lb (68.9 kg) IBW/kg (Calculated) : 57  Temp (24hrs), Avg:101.2 F (38.4 C), Min:99.8 F (37.7 C), Max:102.6 F (39.2 C)  Recent Labs  Lab 01/24/19 1521 01/25/19 0744 01/26/19 0514 01/27/19 0433 01/28/19 0438 01/29/19 0520  WBC 3.4* 3.6* 4.7 4.0 5.4  --   CREATININE 0.92 1.04* 1.01* 1.91* 1.29* 1.01*  LATICACIDVEN 1.1  --   --   --   --   --     Estimated Creatinine Clearance: 50.6 mL/min (A) (by C-G formula based on SCr of 1.01 mg/dL (H)).    No Known Allergies  Antimicrobials this admission: 4/13 HCQ>>4/17 4/12 azith>>4/19 4/17 vanc>> 4/17 zosyn>>  Dose adjustments this admission:   Microbiology results: 4/12 blood>>neg 4/12 COVID>>positive 4/17 MRSA>> 4/17 blood>>  Ulyses Southward, PharmD, [place], AAHIVP, CPP Infectious Disease Pharmacist 01/29/2019 8:50 AM  Addendum  MRSA PCR neg. Dc vanc per Dr. Margo Aye and dc azith after today's dose.  Ulyses Southward, PharmD, BCIDP, AAHIVP, CPP Infectious Disease Pharmacist 01/29/2019 4:07 PM

## 2019-01-29 NOTE — Progress Notes (Signed)
Patient arrived to [facility] room 164, vitals stable, CCMD called and verified.  Oriented x1. Order received for wrist restraints, applied.

## 2019-01-29 NOTE — Progress Notes (Signed)
Patient's husband Tymesha Huhn came to visit the patient. Before he left. Patient's husband requested to take patient's two wedding bands, watch, and purse home with him. The two wedding bands and watch were placed in her black purse. The husband took all three items as he left the room.

## 2019-01-29 NOTE — Progress Notes (Addendum)
PROGRESS NOTE  Angela Fields ZOX:[id number] DOB: [birth year] DOA: 2019/01/29 PCP: Angela Richters, PA-C  HPI/Recap of past 24 hours: Patient is a 68 year old female with severe dementia, diabetes mellitus type 2, anxiety, history of seizures, stroke presented after she was found fallen by a bystander and presented to ED on 4/11 for abrasions around her head.  CT head and neck at that time did not show acute issues.  Unfortunately no family was able to be found and patient remained in ED for 24 hours.  In ED, patient was found to have worsening of the symptoms with more confusion, hypoxia, temperature 100.3 F and chest x-ray changes, patient was admitted for COVID 19 rule out.  Unfortunately her husband is also admitted in the hospital with the COVID-19 (positive) pneumonia.  COVID-19 positive  01/29/19: Patient seen and examined at bedside.  RN reports agitation overnight patient was given 1 dose of subcu Haldol 2.5 mg once.  This morning the patient is lethargic but arousable to sternal rubs.  She does not follow any commands.  Recurrent high-grade fevers this morning with T-max of 102.6.  Obtained a chest x-ray which revealed worsening bilateral bibasilar infiltrates.  Obtained blood cultures x2 peripherally, UA with culture, and started broad-spectrum IV antibiotics empirically.  Blood pressure and O2 saturation are stable.  Patient will transfer to [facility] to continue her care for COVID-19 pulmonary infection.  Assessment/Plan: Principal Problem:   Acute respiratory failure with hypoxia (HCC) Active Problems:   Hypertension   Hyperlipidemia   Seizure (HCC)   Dementia with behavioral disturbance (HCC)   CAP (community acquired pneumonia)   COVID-19 virus detected   Goals of care, counseling/discussion   Palliative care by specialist  Acute hypoxic respiratory failure secondary to COVID-19 pneumonia Presented with fever, hypoxia, pulmonary infiltrates with positive covid-19  T-max 102.6  this morning Due to high-grade recurrent fevers obtained chest x-ray this morning Independently reviewed chest x-ray which shows worsening bilateral bibasilar infiltrates with mild increase in pulmonary vascularity Stop gentle IV fluid hydration which was started due to poor oral intake and AKI with oliguria Currently O2 saturation is 93% on 5 L On continuous pulse oximetry Maintain O2 saturation greater than 92%  Sepsis secondary to COVID-19 pneumonia versus others Persistent recurrent high-grade fevers T-max 102.6 this morning on hospitalization day #7 Tachypneic with respiration rate 24 Panculture with blood cultures peripherally x2, urine analysis with urine culture Obtain procalcitonin Start IV broad-spectrum antibiotics IV vancomycin and IV Zosyn empirically Prior blood cultures x2 reviewed and - to date Prior UA done on admission reviewed and unremarkable for pyuria Continue to monitor fever curve and WBC Repeat CBC with differentials in the morning 01/30/2019 Also repeat markers associated with COVID-19: LDH, CRP, ferritin, d-dimer in the morning Currently on p.o. azithromycin and Plaquenil for COVID-19 Normal QTC Continue to monitor QTC with daily twelve-lead EKGs  Acute metabolic encephalopathy in the setting of advanced dementia with behavioral disturbance Agitation again reported overnight Was given 1 dose of subcu Haldol 0.25 mg once She has mittens in place for patient's own safety Telesitter in the room Reorient as needed  Resolved AKI with oliguria, suspected prerenal from poor oral intake, dehydration versus intermittent hypotension Creatinine 1.01 with GFR greater than 60 on 01/29/2019 from creatinine of 1.91 post IV fluid hydration  Urine output improved Continue to monitor urine output, I&Os Repeat BMP in the morning  Worsening LFTs Repeated LFTs this morning significantly worsened from prior done on 01/24/2019 Repeat CMP in the morning Avoid  hepatotoxic  agents Hold off statin  Hyperammonemia Ammonia level 44 on 01/29/2019 from 53 prior No known history of liver disease Worsening LFTs  Resolved transient sinus bradycardia Unclear etiology Now back to normal sinus rhythm  Dementia with behavioral disturbance On Klonopin 0.5 mg twice daily as needed at home and Seroquel 25 mg twice daily Resume Klonopin 0.25 mg twice daily as needed for anxiety when more alert Hold off Klonopin and Seroquel for now due to lethargy  Seizure disorder Per husband last known seizure activity was 3 months ago Continue Depakote and lamotrigine Seizures precautions  IBS with constipation predominance Resume Linzess    RISKS: High risk for decompensation due to recurrent high-grade fevers, sepsis, acute metabolic encephalopathy, current COVID-19 pulmonary infection, multiple comorbidities, and advanced age.  Patient will require continuous close monitoring.      Code Status: DNR  Family Communication: Updated her husband on 01/29/2019  Disposition Plan: Home possibly in 2 to 3 days when hemodynamically stable or when oxygen supplementation demand is less than 4 L by nasal cannula.   Consultants:  Palliative care team  Procedures:  None  Antimicrobials:  Azithromycin  DVT prophylaxis: Subcu Lovenox daily   Objective: Vitals:   01/28/19 2045 01/28/19 2157 01/28/19 2243 01/29/19 0741  BP:  138/75  (!) 142/55  Pulse:  85    Resp:  18  (!) 24  Temp:  (!) 102.2 F (39 C) 99.8 F (37.7 C) (!) 102.6 F (39.2 C)  TempSrc:  Tympanic Axillary Axillary  SpO2: 94% 93%  94%  Weight:      Height:        Intake/Output Summary (Last 24 hours) at 01/29/2019 0814 Last data filed at 01/29/2019 0804 Gross per 24 hour  Intake -  Output 525 ml  Net -525 ml   Filed Weights   09/07/2019 1403 01/24/19 1002 01/24/19 2137  Weight: 69.4 kg 69.4 kg 68.9 kg    Exam:  . General: 68 y.o. year-old female well-developed well-nourished.  Lethargic  but arousable to sternal rubs.   . Cardiovascular: Regular rate and rhythm with no rubs or gallops.  No JVD or thyromegaly noted.   Marland Kitchen. Respiratory: Mild rales bilaterally with no wheezes noted.  Poor inspiratory effort.   . Abdomen: Soft nontender nondistended with normal bowel sounds x4 quadrants. . Musculoskeletal: Trace lower extremity edema.  2 out of 4 pulses in all 4 extremities. Marland Kitchen. Psychiatry: Unable to assess her mood due to lethargy.  Data Reviewed: CBC: Recent Labs  Lab 09/07/2019 1506 01/24/19 1521 01/24/19 1535 01/25/19 0744 01/26/19 0514 01/27/19 0433 01/28/19 0438  WBC 2.8* 3.4*  --  3.6* 4.7 4.0 5.4  NEUTROABS 1.9 2.4  --   --   --   --   --   HGB 11.8* 12.8 13.6 13.3 13.8 12.8 12.2  HCT 37.5 41.7 40.0 41.9 42.4 41.0 39.6  MCV 93.5 92.1  --  90.7 88.1 92.1 92.1  PLT 162 144*  --  126* 92* 125* 162   Basic Metabolic Panel: Recent Labs  Lab 01/25/19 0744 01/26/19 0514 01/27/19 0433 01/28/19 0438 01/29/19 0520  NA 138 138 139 142 145  K 3.6 4.4 3.5 3.8 3.5  CL 99 97* 100 100 102  CO2 23 27 28 31  35*  GLUCOSE 94 99 103* 93 106*  BUN 9 11 18 23 12   CREATININE 1.04* 1.01* 1.91* 1.29* 1.01*  CALCIUM 8.6* 8.3* 8.1* 8.2* 8.2*   GFR: Estimated Creatinine Clearance: 50.6 mL/min (A) (  by C-G formula based on SCr of 1.01 mg/dL (H)). Liver Function Tests: Recent Labs  Lab 01/24/19 1521 01/29/19 0520  AST 54* 273*  ALT 35 107*  ALKPHOS 73 61  BILITOT 0.9 0.6  PROT 6.9 5.8*  ALBUMIN 3.7 2.5*   No results for input(s): LIPASE, AMYLASE in the last 168 hours. Recent Labs  Lab 01/26/19 1205 01/29/19 0520  AMMONIA 53* 44*   Coagulation Profile: No results for input(s): INR, PROTIME in the last 168 hours. Cardiac Enzymes: No results for input(s): CKTOTAL, CKMB, CKMBINDEX, TROPONINI in the last 168 hours. BNP (last 3 results) No results for input(s): PROBNP in the last 8760 hours. HbA1C: No results for input(s): HGBA1C in the last 72 hours. CBG: Recent Labs   Lab 01/28/19 0820 01/28/19 1113 01/28/19 1706 01/28/19 2218 01/29/19 0735  GLUCAP 74 77 85 89 79   Lipid Profile: No results for input(s): CHOL, HDL, LDLCALC, TRIG, CHOLHDL, LDLDIRECT in the last 72 hours. Thyroid Function Tests: No results for input(s): TSH, T4TOTAL, FREET4, T3FREE, THYROIDAB in the last 72 hours. Anemia Panel: No results for input(s): VITAMINB12, FOLATE, FERRITIN, TIBC, IRON, RETICCTPCT in the last 72 hours. Urine analysis:    Component Value Date/Time   COLORURINE STRAW (A) 01/24/2019 2028   APPEARANCEUR CLEAR 01/24/2019 2028   LABSPEC 1.005 01/24/2019 2028   PHURINE 8.0 01/24/2019 2028   GLUCOSEU NEGATIVE 01/24/2019 2028   GLUCOSEU NEGATIVE 09/02/2018 1042   HGBUR SMALL (A) 01/24/2019 2028   BILIRUBINUR NEGATIVE 01/24/2019 2028   BILIRUBINUR Negative 09/24/2018 1010   KETONESUR 20 (A) 01/24/2019 2028   PROTEINUR NEGATIVE 01/24/2019 2028   UROBILINOGEN negative (A) 09/24/2018 1010   UROBILINOGEN 0.2 09/02/2018 1042   NITRITE NEGATIVE 01/24/2019 2028   LEUKOCYTESUR NEGATIVE 01/24/2019 2028   Sepsis Labs: (procalcitonin:4,lacticidven:4)  ) Recent Results (from the past 240 hour(s))  SARS Coronavirus 2 [facility] order, Performed in [facility] hospital lab)     Status: Abnormal   Collection Time: 01/24/19  9:46 AM  Result Value Ref Range Status   SARS Coronavirus 2 POSITIVE (A) NEGATIVE Final    Comment: RESULT CALLED TO, READ BACK BY AND VERIFIED WITH: RN Shelia Media [id number] MLM (NOTE) If result is NEGATIVE SARS-CoV-2 target nucleic acids are NOT DETECTED. The SARS-CoV-2 RNA is generally detectable in upper and lower  respiratory specimens during the acute phase of infection. The lowest  concentration of SARS-CoV-2 viral copies this assay can detect is 250  copies / mL. A negative result does not preclude SARS-CoV-2 infection  and should not be used as the sole basis for treatment or other  patient management decisions.  A negative  result may occur with  improper specimen collection / handling, submission of specimen other  than nasopharyngeal swab, presence of viral mutation(s) within the  areas targeted by this assay, and inadequate number of viral copies  (<250 copies / mL). A negative result must be combined with clinical  observations, patient history, and epidemiological information. If result is POSITIVE SARS-CoV-2 target nucleic acids are DETECTED. The SARS- CoV-2 RNA is generally detectable in upper and lower  respiratory specimens during the acute phase of infection.  Positive  results are indicative of active infection with SARS-CoV-2.  Clinical  correlation with patient history and other diagnostic information is  necessary to determine patient infection status.  Positive results do  not rule out bacterial infection or co-infection with other viruses. If result is PRESUMPTIVE POSTIVE SARS-CoV-2 nucleic acids MAY BE PRESENT.  A presumptive positive result was obtained on the submitted specimen  and confirmed on repeat testing.  While 2019 novel coronavirus  (SARS-CoV-2) nucleic acids may be present in the submitted sample  additional confirmatory testing may be necessary for epidemiological  and / or clinical management purposes  to differentiate between  SARS-CoV-2 and other Sarbecovirus currently known to infect humans.  If clinically indicated additional testing with an alternate test  methodology [phone]) is advised . The SARS-CoV-2 RNA is generally  detectable in upper and lower respiratory specimens during the acute  phase of infection. The expected result is Negative. Fact Sheet for Patients:  BoilerBrush.com.cy Fact Sheet for Healthcare Providers: https://pope.com/ This test is not yet approved or cleared by the [place] FDA and has been authorized for detection and/or diagnosis of SARS-CoV-2 by FDA under an Emergency Use Authorization  (EUA).  This EUA will remain in effect (meaning this test can be used) for the duration of the COVID-19 declaration under Section 564(b)(1) of the Act, 21 U.S.C. section 360bbb-3(b)(1), unless the authorization is terminated or revoked sooner. Performed at [facility], [address], [place], [place] [address]   Blood Culture (routine x 2)     Status: None (Preliminary result)   Collection Time: 01/24/19  3:19 PM  Result Value Ref Range Status   Specimen Description BLOOD SITE NOT SPECIFIED  Final   Special Requests   Final    BOTTLES DRAWN AEROBIC ONLY Blood Culture adequate volume   Culture   Final    NO GROWTH 4 DAYS Performed at [facility], [address], [place], [place] [address]    Report Status PENDING  Incomplete  Blood Culture (routine x 2)     Status: None (Preliminary result)   Collection Time: 01/24/19  3:27 PM  Result Value Ref Range Status   Specimen Description BLOOD WRIST RIGHT  Final   Special Requests   Final    BOTTLES DRAWN AEROBIC AND ANAEROBIC Blood Culture adequate volume   Culture   Final    NO GROWTH 4 DAYS Performed at [facility], [address], [place], [place] [address]    Report Status PENDING  Incomplete      Studies: Dg Chest Port 1 View  Result Date: 01/29/2019 CLINICAL DATA:  High-grade fever. Worsening hypoxia. COVID-19 positive. EXAM: PORTABLE CHEST 1 VIEW COMPARISON:  01/24/2019 FINDINGS: Heart size is normal.  Aortic atherosclerosis. Worsening airspace disease is seen in both lower lungs. No evidence of pleural effusion. IMPRESSION: Worsening bilateral lower lobe airspace disease. Electronically Signed   By: Myles Rosenthal M.D.   On: 01/29/2019 07:31    Scheduled Meds: . albuterol  2 puff Inhalation Q6H  . aspirin EC  81 mg Oral Daily  . azithromycin  500 mg Oral Q24H  . divalproex  500 mg Oral QHS  . donepezil  10 mg Oral Daily  . enoxaparin (LOVENOX) injection  40 mg Subcutaneous Q24H  . folic acid  1 mg Oral  Daily  . hydroxychloroquine  200 mg Oral BID  . lamoTRIgine  150 mg Oral BID  . linaclotide  72 mcg Oral QAC breakfast  . mouth rinse  15 mL Mouth Rinse BID  . pravastatin  10 mg Oral QHS  . QUEtiapine  25 mg Oral BID    Continuous Infusions:    LOS: 5 days     Darlin Drop, MD [facility] Pager [phone]  If 7PM-7AM, please contact night-coverage [email or web address]  Password [email or web address] 01/29/2019, 8:14 AM

## 2019-01-29 NOTE — Care Management (Signed)
Case manager will be monitoring patient for disposition when medically improved. May God bless her.    Vance Peper, RN Case Manager  [phone]

## 2019-01-30 ENCOUNTER — Other Ambulatory Visit: Payer: Self-pay

## 2019-01-30 DIAGNOSIS — R569 Unspecified convulsions: Secondary | ICD-10-CM

## 2019-01-30 LAB — CBC WITH DIFFERENTIAL/PLATELET
Abs Immature Granulocytes: 0.05 10*3/uL (ref 0.00–0.07)
Basophils Absolute: 0 10*3/uL (ref 0.0–0.1)
Basophils Relative: 0 %
Eosinophils Absolute: 0 10*3/uL (ref 0.0–0.5)
Eosinophils Relative: 0 %
HCT: 37 % (ref 36.0–46.0)
Hemoglobin: 11.4 g/dL — ABNORMAL LOW (ref 12.0–15.0)
Immature Granulocytes: 1 %
Lymphocytes Relative: 11 %
Lymphs Abs: 0.8 10*3/uL (ref 0.7–4.0)
MCH: 29.5 pg (ref 26.0–34.0)
MCHC: 30.8 g/dL (ref 30.0–36.0)
MCV: 95.6 fL (ref 80.0–100.0)
Monocytes Absolute: 0.4 10*3/uL (ref 0.1–1.0)
Monocytes Relative: 6 %
Neutro Abs: 5.5 10*3/uL (ref 1.7–7.7)
Neutrophils Relative %: 82 %
Platelets: 233 10*3/uL (ref 150–400)
RBC: 3.87 MIL/uL (ref 3.87–5.11)
RDW: 14.2 % (ref 11.5–15.5)
WBC: 6.8 10*3/uL (ref 4.0–10.5)
nRBC: 0 % (ref 0.0–0.2)

## 2019-01-30 LAB — COMPREHENSIVE METABOLIC PANEL
ALT: 145 U/L — ABNORMAL HIGH (ref 0–44)
AST: 302 U/L — ABNORMAL HIGH (ref 15–41)
Albumin: 2.8 g/dL — ABNORMAL LOW (ref 3.5–5.0)
Alkaline Phosphatase: 95 U/L (ref 38–126)
Anion gap: 12 (ref 5–15)
BUN: 18 mg/dL (ref 8–23)
CO2: 30 mmol/L (ref 22–32)
Calcium: 7.8 mg/dL — ABNORMAL LOW (ref 8.9–10.3)
Chloride: 102 mmol/L (ref 98–111)
Creatinine, Ser: 1.11 mg/dL — ABNORMAL HIGH (ref 0.44–1.00)
GFR calc Af Amer: 58 mL/min — ABNORMAL LOW (ref >60)
GFR calc non Af Amer: 50 mL/min — ABNORMAL LOW (ref >60)
Glucose, Bld: 75 mg/dL (ref 70–99)
Potassium: 3.8 mmol/L (ref 3.5–5.1)
Sodium: 144 mmol/L (ref 135–145)
Total Bilirubin: 0.7 mg/dL (ref 0.3–1.2)
Total Protein: 6.3 g/dL — ABNORMAL LOW (ref 6.5–8.1)

## 2019-01-30 LAB — GLUCOSE, CAPILLARY
Glucose-Capillary: 81 mg/dL (ref 70–99)
Glucose-Capillary: 77 mg/dL (ref 70–99)
Glucose-Capillary: 66 mg/dL — ABNORMAL LOW (ref 70–99)
Glucose-Capillary: 91 mg/dL (ref 70–99)

## 2019-01-30 LAB — D-DIMER, QUANTITATIVE: D-Dimer, Quant: 3.02 ug/mL-FEU — ABNORMAL HIGH (ref 0.00–0.50)

## 2019-01-30 LAB — C-REACTIVE PROTEIN: CRP: 22.2 mg/dL — ABNORMAL HIGH (ref <1.0)

## 2019-01-30 LAB — LACTATE DEHYDROGENASE: LDH: 1260 U/L — ABNORMAL HIGH (ref 98–192)

## 2019-01-30 LAB — FERRITIN: Ferritin: 870 ng/mL — ABNORMAL HIGH (ref 11–307)

## 2019-01-30 MED ORDER — ONDANSETRON HCL 4 MG/2ML IJ SOLN: 4.0000 mg | Freq: Four times a day (QID) | INTRAMUSCULAR | Status: DC | PRN

## 2019-01-30 MED ORDER — HALOPERIDOL LACTATE 5 MG/ML IJ SOLN
0.5000 mg | INTRAMUSCULAR | Status: DC | PRN
  Administered 2019-02-02: 0.5 mg via INTRAVENOUS
  Filled 2019-01-30: qty 1

## 2019-01-30 MED ORDER — MORPHINE 100MG IN NS 100ML (1MG/ML) PREMIX INFUSION
1.0000 mg/h | INTRAVENOUS | Status: DC
  Administered 2019-01-30: 20:00:00 1 mg/h via INTRAVENOUS
  Administered 2019-02-01: 3 mg/h via INTRAVENOUS
  Filled 2019-01-30 (×2): qty 100

## 2019-01-30 MED ORDER — LORAZEPAM 2 MG/ML IJ SOLN
1.0000 mg | INTRAMUSCULAR | Status: DC | PRN
  Administered 2019-01-30 (×2): 1 mg via INTRAVENOUS
  Filled 2019-01-30: qty 1

## 2019-01-30 MED ORDER — GLYCOPYRROLATE 1 MG PO TABS
1.0000 mg | ORAL_TABLET | ORAL | Status: DC | PRN
  Filled 2019-01-30: qty 1

## 2019-01-30 MED ORDER — ONDANSETRON 4 MG PO TBDP: 4.0000 mg | ORAL_TABLET | Freq: Four times a day (QID) | ORAL | Status: DC | PRN

## 2019-01-30 MED ORDER — MORPHINE BOLUS VIA INFUSION
1.0000 mg | INTRAVENOUS | Status: DC | PRN
  Filled 2019-01-30: qty 1

## 2019-01-30 MED ORDER — MORPHINE 100MG IN NS 100ML (1MG/ML) PREMIX INFUSION: 1.0000 mg/h | INTRAVENOUS | Status: DC

## 2019-01-30 MED ORDER — SODIUM CHLORIDE 0.9 % IV SOLN
1.0000 mg/h | INTRAVENOUS | Status: DC
  Filled 2019-01-30: qty 4

## 2019-01-30 MED ORDER — ACETAMINOPHEN 325 MG PO TABS
650.0000 mg | ORAL_TABLET | Freq: Four times a day (QID) | ORAL | Status: DC | PRN
  Filled 2019-01-30: qty 2

## 2019-01-30 MED ORDER — POLYVINYL ALCOHOL 1.4 % OP SOLN
1.0000 [drp] | Freq: Four times a day (QID) | OPHTHALMIC | Status: DC | PRN
  Filled 2019-01-30: qty 15

## 2019-01-30 MED ORDER — ACETAMINOPHEN 650 MG RE SUPP
650.0000 mg | Freq: Four times a day (QID) | RECTAL | Status: DC | PRN
  Administered 2019-01-30 – 2019-02-02 (×4): 650 mg via RECTAL
  Filled 2019-01-30 (×5): qty 1

## 2019-01-30 MED ORDER — SODIUM CHLORIDE 0.9 % IV SOLN
1.0000 mg/h | INTRAVENOUS | Status: DC
  Filled 2019-01-30: qty 2

## 2019-01-30 MED ORDER — MORPHINE 100MG IN NS 100ML (1MG/ML) PREMIX INFUSION
1.0000 mg/h | INTRAVENOUS | Status: DC
  Administered 2019-01-30: 13:00:00 1 mg/h via INTRAVENOUS
  Filled 2019-01-30: qty 100

## 2019-01-30 MED ORDER — HALOPERIDOL 0.5 MG PO TABS
0.5000 mg | ORAL_TABLET | ORAL | Status: DC | PRN
  Administered 2019-01-30: 0.5 mg via ORAL
  Filled 2019-01-30 (×2): qty 1

## 2019-01-30 MED ORDER — LORAZEPAM 2 MG/ML IJ SOLN
1.0000 mg | INTRAMUSCULAR | Status: DC | PRN
  Filled 2019-01-30: qty 1

## 2019-01-30 MED ORDER — HALOPERIDOL LACTATE 2 MG/ML PO CONC
0.5000 mg | ORAL | Status: DC | PRN
  Filled 2019-01-30: qty 0.3

## 2019-01-30 MED ORDER — MORPHINE BOLUS VIA INFUSION
1.0000 mg | INTRAVENOUS | Status: DC | PRN
  Administered 2019-01-30 (×5): 1 mg via SUBCUTANEOUS
  Filled 2019-01-30: qty 1

## 2019-01-30 MED ORDER — MORPHINE BOLUS VIA INFUSION
1.0000 mg | INTRAVENOUS | Status: DC | PRN
  Administered 2019-01-31 – 2019-02-02 (×6): 1 mg via INTRAVENOUS
  Filled 2019-01-30: qty 1

## 2019-01-30 MED ORDER — GLYCOPYRROLATE 0.2 MG/ML IJ SOLN: 0.2000 mg | INTRAMUSCULAR | Status: DC | PRN

## 2019-01-30 MED ORDER — BIOTENE DRY MOUTH MT LIQD: 15.0000 mL | OROMUCOSAL | Status: DC | PRN

## 2019-01-30 MED ORDER — SODIUM CHLORIDE 0.9 % IV SOLN
1.0000 mg/h | INTRAVENOUS | Status: DC
  Administered 2019-01-30: 14:00:00 1 mg/h via SUBCUTANEOUS
  Filled 2019-01-30: qty 10

## 2019-01-30 NOTE — Progress Notes (Signed)
Patient's IV infiltrated, multiple nurses attempted to start a new line with no success.

## 2019-01-30 NOTE — Progress Notes (Signed)
Name: Angela Fields and Angela Fields  Location: [facility]: Comfort care and Grief support.   Chaplain called to support the husband who was visibility upset about wife's condition. Comfort measures have been started for [facility]. Chaplain provided emotional support via active listening. Pt and husband are Jehov[employer]'s Witness. Mr. Legault reported that their beliefs are important as it relates to their medical decisions. Husband was open to the visit and expressed appreciation. Even though the husband was "sad," by the end of the visit husband was laughing and expressing his gratefulness to God as he thought about all the experiences he has had with his wife over the years.   Chaplain (on-call)  will be more than happy to visit again if husband seems to be in need of more emotional support.   Thanks

## 2019-01-30 NOTE — Progress Notes (Signed)
[facility] PROGRESS NOTE  Angela Fields  ION:[id number] DOB: 12-19-1950 DOA: 2019/02/11 PCP: Esperanza Richters, PA-C  Brief Narrative: Angela Fields is a 68 y.o. female with a history of dementia, T2DM, anxiety, CVA, and seizure disorder who presented to the ED 4/11 after being found down by a bystander. CT head and neck showed no acute fracture/dislocation. While in the ED she displayed increasing confusion, hypoxia and fever. CXR demonstrated opacities and she was admitted, found to have covid-19. Azithromycin and hydroxychloroquine were given 4/13 - 4/17 though she has continued to have fevers and worsening respiratory status. On 4/17 she was transferred to [facility] for further management on 15L NRB. Blood cultures obtained, SLP evaluation was planned and empiric broad spectrum antibiotics provided. IV lasix was also given in an effort to help.  Despite this, laboratory findings continue to demonstrate worsening with steady rise in LDH, ferritin, CRP. CXR repeated 4/17 showed worsening bilateral airspace opacities, and she continues to decompensate clinically. Based on goals of care discussions with her husband, who is also hospitalized with covid-19, she is a known DNR.  Subjective: Poorly responsive, agitated last night requiring IV ativan and physical restraints to keep NRB on.  Objective: BP (!) 89/47 (BP Location: Left Arm)   Pulse 68   Temp 99 F (37.2 C) (Axillary)   Resp (!) 40   Ht 5\' 5"  (1.651 m)   Wt 68.9 kg   SpO2 93%   BMI 25.29 kg/m   Gen: Elderly, critically ill-appearing female poorly responsive. Pulm: Coarse, tachypneic in high 30's, SpO2 85-90% on 15L NRB.  CV: RRR, no murmur, no JVD, no edema GI: Soft, NT, ND, +BS  Neuro: Lethargic, poorly rousable.  Ext: Warm, no deformities Skin: No rashes, lesions or ulcers  Assessment & Plan: Principal Problem:   Acute respiratory failure with hypoxia (HCC) Active Problems:   Hypertension   Hyperlipidemia   Seizure (HCC)  Dementia with behavioral disturbance (HCC)   CAP (community acquired pneumonia)   COVID-19 virus detected   Goals of care, counseling/discussion   Palliative care by specialist  Acute hypoxic respiratory failure and sepsis due to covid-19 infection: Worsening despite optimal medical management. Remains hypoxic despite 15L NRB and in significant respiratory distress. Pt is DNR. I've spoken with her husband at length regarding the fact that I believe she is at the end of life and we agree that her comfort is the preeminent priority at this time.  - Will give morphine bolus and infusion for dyspnea as her RR ~40.  - DC abx. No improvement with lasix and no crackles or edema on exam.   Dementia with acute delirium, acute metabolic encephalopathy, behavioral disturbance:  - Can give anxiolytics as needed, but will discontinue physical restraints and other continuous monitoring to minimize agitation. Will manage restlessness medically.  Suspicion of aspiration: Based on clinical trajectory, do not feel SLP evaluation would change outcome and would put HCW at risk unnecessarily.  - Comfort feeds if desired.  Seizure disorder:  - Continue AEDs if reasonable.  LFT elevation: Mild but progressive consistent with viral process.   Hyperammonemia: Doubt this is contributing to mental status changes outlined above. Lactulose and similar pose greater risks than benefits in this patient regardless.  Tyrone Nine, MD [facility] [email or web address] Password [email or web address] 01/30/2019, 1:00 PM

## 2019-01-30 NOTE — Progress Notes (Signed)
Paged Purohit, MD via Amion to make him aware that patient agitated and wont leave non-re-breather mask on and is de-sating.  MD called back and stated he would place order for IV ativan.

## 2019-01-30 NOTE — Progress Notes (Signed)
Speech-Language Pathology Contact Note:  Orders received for swallow assessment; pt not sufficiently alert for swallow evaluation per RN.  Sa02 in 90s on NRB. Pt consumed pills in applesauce without difficulty; coughing when drinking water.   SLP will follow for readiness for assessment. D/W RN, Elmarie Shiley.  Will attempt f/u next date.  Tristina Sahagian L. Samson Frederic, MA CCC/SLP Acute Rehabilitation Services Office number [phone] Pager [phone]

## 2019-01-30 NOTE — Progress Notes (Addendum)
Patient's CBG 66. Offered juice to patient and she and husband refused. Pt's temp 102. Husband did request for her to have rectal tylenol, cold wash clothes, and removal of blankets.

## 2019-01-30 NOTE — Progress Notes (Signed)
Morphine subcutaneous wasted with Maxwell Caul RN and Heber Blue Diamond RN. Wasted of medication and discarded into the medication wate and discarded the line

## 2019-01-30 NOTE — Progress Notes (Addendum)
Comfort measures initiated per Dr. Jarvis Newcomer. Tele, restraints, and pulse ox discontinued. Pt's husband is at the bedside, educated, and participating with comfort care.

## 2019-01-30 NOTE — Progress Notes (Signed)
MD notified unable to insert PIV on patient

## 2019-01-31 NOTE — Progress Notes (Signed)
[facility] PROGRESS NOTE  Angela Fields  ZOX:[id number] DOB: 1951-04-19 DOA: 27-Oct-2018 PCP: Esperanza RichtersSaguier, Edward, PA-C  Brief Narrative: Angela Fields is a 68 y.o. female with a history of dementia, T2DM, anxiety, CVA, and seizure disorder who presented to the ED 4/11 after being found down by a bystander. CT head and neck showed no acute fracture/dislocation. While in the ED she displayed increasing confusion, hypoxia and fever. CXR demonstrated opacities and she was admitted, found to have covid-19. Azithromycin and hydroxychloroquine were given 4/13 - 4/17 though she has continued to have fevers and worsening respiratory status. On 4/17 [facility] was [facility] management on 15L NRB. Blood cultures obtained, SLP evaluation was planned and empiric broad spectrum antibiotics provided. IV lasix was also given in an effort to help.  Despite this, laboratory findings continue to demonstrate worsening with steady rise in LDH, ferritin, CRP. CXR repeated 4/17 showed worsening bilateral airspace opacities, and she continues to decompensate clinically. Based on goals of care discussions with her husband, who is also hospitalized with covid-19, she is a known DNR. On 4/19 she was tachypneic in the high 30/min and remained hypoxic into the 80%'s despite 15L NRB. A morphine infusion was begun as a part of transition to complete comfort measures and her husband was allowed at bedside.  Subjective: This morning she was more responsive, appeared to be doing better though remained in 80%'s on NRB and she's become more lethargic later this morning without sedative administration.   Objective: BP (!) 109/57 (BP Location: Left Arm)   Pulse 83   Temp 98.5 F (36.9 C) (Axillary)   Resp (!) 33   Ht 5\' 5"  (1.651 m)   Wt 68.9 kg   SpO2 91%   BMI 25.29 kg/m   Gen: 68 y.o. female in no distress Pulm: Mildly labored on NRB, tachypneic with bilaterally coarse breath sounds. CV: Regular rate and rhythm.  No murmur, rub, or gallop. No JVD, no dependent edema. GI: Abdomen soft, non-tender, non-distended, with normoactive bowel sounds.  Ext: Warm, no deformities Skin: No new rashes, lesions or ulcers on visualized skin. Neuro: Disoriented. No focal neurological deficits on exam limited by cooperation. Psych: Judgement and insight absent. Not agitated.    Assessment & Plan: Principal Problem:   Acute respiratory failure with hypoxia (HCC) Active Problems:   Hypertension   Hyperlipidemia   Seizure (HCC)   Dementia with behavioral disturbance (HCC)   CAP (community acquired pneumonia)   COVID-19 virus detected   Goals of care, counseling/discussion   Palliative care by specialist  Acute hypoxic respiratory failure and sepsis due to covid-19 infection: Worsening despite optimal medical management. Remains hypoxic despite 15L NRB and in significant respiratory distress. Pt is DNR. I've spoken with her husband at length regarding the fact that I believe she is at the end of life and we agree that her comfort is the preeminent priority at this time.  - Continue morphine, can titrate 1-2mg /hr and give bolus doses as needed for respiratory distress. D/w RN. Anticipate inpatient death. - Can continue supplemental oxygen if patient appears uncomfortable.  Dementia with acute delirium, acute metabolic encephalopathy, behavioral disturbance:  - Can give anxiolytics as needed, but currently lethargic when administered. DC physical restraints and other continuous monitoring to minimize agitation. Will manage restlessness medically.  Suspicion of aspiration: Based on clinical trajectory, do not feel SLP evaluation would change outcome and would put HCW at risk unnecessarily.  - Comfort feeds if desired. When taking po, no  choking noted per RN.  Seizure disorder:  - Continue AEDs if reasonable.  LFT elevation: Mild but progressive consistent with viral process.   Hyperammonemia: Doubt this is  contributing to mental status changes outlined above. Lactulose and similar pose greater risks than benefits in this patient regardless.  Tyrone Nine, MD [facility] [email or web address] Password [facility] 01/31/2019, 12:06 PM

## 2019-01-31 NOTE — Progress Notes (Addendum)
Pt is to lethargic too take PO medications. Pt keeps talking nonrebreathing mask off, sats decrease to 80's. sats recover to 95% once mask reapplied.

## 2019-01-31 NOTE — Progress Notes (Addendum)
RR rate 35 breathing is shallow and rapid. Administered PRN 1 mg of morphine bolus. Will continue to monitor. Pt unable to take PO meds due to being Lethargic. Pt is tense and will sats drop to 80-89% but she does not sustain. notified Dr. Jarvis Newcomer through epic message and made aware.

## 2019-01-31 NOTE — Progress Notes (Signed)
Pt temp 102.3 axillary. O2 sat dropped to 41% on nasal cannula 2L with RR 39. Pt placed on NRB mask with sats increasing to high 90s. Administered tylenol suppository. Will continue to monitor. Mick Sell RN

## 2019-02-01 ENCOUNTER — Telehealth: Payer: Self-pay | Admitting: Medical

## 2019-02-01 LAB — GLUCOSE, CAPILLARY
Glucose-Capillary: 82 mg/dL (ref 70–99)
Glucose-Capillary: 93 mg/dL (ref 70–99)

## 2019-02-01 NOTE — Progress Notes (Signed)
[facility] PROGRESS NOTE  Angela DohenyCarolyn Fields  ZOX:[id number] DOB: [birth year] DOA: 11-Feb-2019 PCP: Esperanza RichtersSaguier, Edward, PA-C  Brief Narrative: Angela DohenyCarolyn Habib is a 68 y.o. female with a history of dementia, T2DM, anxiety, CVA, and seizure disorder who presented to the ED 4/11 after being found down by a bystander. CT head and neck showed no acute fracture/dislocation. While in the ED she displayed increasing confusion, hypoxia and fever. CXR demonstrated opacities and she was admitted, found to have covid-19. Azithromycin and hydroxychloroquine were given 4/13 - 4/17 though she has continued to have fevers and worsening respiratory status. On 4/17 [facility] was [facility] on 15L NRB. Blood cultures obtained, SLP evaluation was planned and empiric broad spectrum antibiotics provided. IV lasix was also given in an effort to help.  Despite this, laboratory findings continue to demonstrate worsening with steady rise in LDH, ferritin, CRP. CXR repeated 4/17 showed worsening bilateral airspace opacities, and she continues to decompensate clinically. Based on goals of care discussions with her husband, who is also hospitalized with covid-19, she is a known DNR. On 4/19 she was tachypneic in the high 30/min and remained hypoxic into the 80%'s despite 15L NRB. A morphine infusion was begun as a part of transition to complete comfort measures and her husband was allowed at bedside.  Subjective: Febrile again, tachypneic in some distress which was improved with increasing morphine. Still some difficulty keeping NRB on. The patient is awake, sitting up in bed and moaning with some breaths, exasperatedly stating "I'm fine" to any question asked. Lethargy off and on continues.  Objective: BP (!) 102/49 (BP Location: Left Arm)   Pulse 90   Temp 98.3 F (36.8 C) (Axillary)   Resp 14   Ht 5\' 5"  (1.651 m)   Wt 68.9 kg   SpO2 90%   BMI 25.29 kg/m   Gen: 68 y.o. female in no distress Pulm:  Nonlabored, comfortable ~20/min in 80%'s on NRB at 15L. CV: Regular rate and rhythm. No murmur, rub, or gallop. No JVD, no dependent edema. GI: Abdomen soft, non-tender, non-distended, with normoactive bowel sounds.  Ext: Warm, no deformities Skin: No rashes, lesions or ulcers on visualized skin. Right facial hypopigmentation is stable Neuro: Rousable, not oriented, moving all extremities.  Psych: Judgement and insight impaired. Not anxious-appearing  Assessment & Plan: Principal Problem:   Acute respiratory failure with hypoxia (HCC) Active Problems:   Hypertension   Hyperlipidemia   Seizure (HCC)   Dementia with behavioral disturbance (HCC)   CAP (community acquired pneumonia)   COVID-19 virus detected   Goals of care, counseling/discussion   Palliative care by specialist  Acute hypoxic respiratory failure and sepsis due to covid-19 infection: Worsening despite optimal medical management. Remains hypoxic despite 15L NRB and in significant respiratory distress. Pt is DNR. I've spoken with her husband at length regarding the fact that I believe she is at the end of life and we agree that her comfort is the preeminent priority at this time.  - Continue morphine, continue titrating, requiring increasing doses. Anticipate inpatient death. - Can continue supplemental oxygen if patient appears uncomfortable.  Dementia with acute delirium, acute metabolic encephalopathy, behavioral disturbance:  - Can give anxiolytics as needed. DC physical restraints and other continuous monitoring to minimize agitation. Will manage restlessness medically.  Suspicion of aspiration: Based on clinical trajectory, do not feel SLP evaluation would change outcome and would put HCW at risk unnecessarily.  - Comfort feeds if desired. When taking po, no choking noted  per RN.  Seizure disorder:  - Continue AEDs if able to take po.  LFT elevation: Mild but progressive consistent with viral process.    Hyperammonemia: Doubt this is contributing to mental status changes outlined above. Lactulose and similar pose greater risks than benefits in this patient regardless.  Tyrone Nine, MD [facility] [email or web address] Password [facility] 02/01/2019, 8:57 AM

## 2019-02-01 NOTE — Progress Notes (Signed)
   02/01/19 1300  Clinical Encounter Type  Visited With Patient;Other (Comment) (Not responsive to my visit)  Visit Type Initial;Psychological support;Spiritual support  Referral From Chaplain  Consult/Referral To Chaplain  Spiritual Encounters  Spiritual Needs Emotional;Other (Comment) (Spiritual Care Support)  Stress Factors  Patient Stress Factors Not reviewed   I briefly visited with Mrs. Estime. She was not responsive to my introduction.    Please, contact Spiritual care for further assistance.  Chaplain Clint Bolder M.Div., [facility]

## 2019-02-01 NOTE — Telephone Encounter (Signed)
Opened to review 

## 2019-02-03 LAB — CULTURE, BLOOD (ROUTINE X 2)
Culture: NO GROWTH
Culture: NO GROWTH
Special Requests: ADEQUATE
Special Requests: ADEQUATE

## 2019-02-12 NOTE — Death Summary Note (Signed)
DEATH SUMMARY   Patient Details  Name: Angela Fields Nace MRN: [id number] DOB: [birth year]  Admission/Discharge Information   Admit Date:  Oct 04, 2019  Date of Death:   01/18/2019   Time of Death:   10:06am  Length of Stay: 9  Referring Physician: Esperanza RichtersSaguier, Edward, PA-C   Reason(s) for Hospitalization  Covid-19  Diagnoses  Preliminary cause of death: Covid-19 Secondary Diagnoses (including complications and co-morbidities):  Principal Problem:   Acute respiratory failure with hypoxia (HCC) Active Problems:   Hypertension   Hyperlipidemia   Seizure (HCC)   Dementia with behavioral disturbance (HCC)   CAP (community acquired pneumonia)   COVID-19 virus detected   Goals of care, counseling/discussion   Palliative care by specialist   Brief Hospital Course (including significant findings, care, treatment, and services provided and events leading to death)  Angela Fields Canniff is a 68 y.o. female with a history of dementia, T2DM, anxiety, CVA, and seizure disorder who presented to the ED 4/11 after being found down by a bystander. CT head and neck showed no acute fracture/dislocation. While in the ED she displayed increasing confusion, hypoxia and fever. CXR demonstrated opacities and she was admitted, found to have covid-19. Azithromycin and hydroxychloroquine were given 4/13 - 4/17 though she has continued to have fevers and worsening respiratory status. [facility] 4[facility]17 [facility] 15L NRB. Blood cultures obtained, SLP evaluation was planned and empiric broad spectrum antibiotics provided. IV lasix was also given in an effort to help.  Despite this, laboratory findings continue to demonstrate worsening with steady rise in LDH, ferritin, CRP. CXR repeated 4/17 showed worsening bilateral airspace opacities, and she continued to decompensate clinically. Based on goals of care discussions with her husband, who is also hospitalized with covid-19, she is a known DNR. On  4/19 she was tachypneic in the high 30/min and remained hypoxic into the 80%'s despite 15L NRB. A morphine infusion was begun as a part of transition to complete comfort measures and her husband was allowed at bedside. As anticipated, she expired as an inpatient on 4/21 with her husband at the bedside.  Pertinent Labs and Studies  Significant Diagnostic Studies Ct Head Wo Contrast  Result Date: Oct 04, 2019 CLINICAL DATA:  Fall with head injury.  No loss of consciousness. EXAM: CT HEAD WITHOUT CONTRAST CT CERVICAL SPINE WITHOUT CONTRAST TECHNIQUE: Multidetector CT imaging of the head and cervical spine was performed following the standard protocol without intravenous contrast. Multiplanar CT image reconstructions of the cervical spine were also generated. COMPARISON:  CT 12/27/2018 and 12/24/2018. Cervical spine CT 12/24/2018. FINDINGS: CT HEAD FINDINGS Brain: There is no evidence of acute intracranial hemorrhage, mass lesion, brain edema or extra-axial fluid collection. Extensive chronic encephalomalacia is again noted in the left parietooccipital lobe with associated ventriculomegaly and patchy periventricular white matter disease. No evidence of acute infarct or hydrocephalus. Vascular: Left carotid siphon aneurysm coil again noted. No hyperdense vessel. Skull: Negative for fracture or focal lesion. Sinuses/Orbits: The visualized paranasal sinuses, mastoid air cells and middle ears are clear. No orbital abnormalities are seen. Other: None. CT CERVICAL SPINE FINDINGS Alignment: Mild convex left scoliosis.  Normal sagittal alignment. 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 No evidence of acute fracture or traumatic subluxation. Soft tissues and spinal canal: No prevertebral fluid or swelling. No visible canal hematoma. Disc levels: Multilevel spondylosis again noted, greatest at C5-6 and C6-7. No high-grade spinal stenosis identified. There are scattered facet  degenerative changes which are worst  on the left at C4-5. Upper chest: Unremarkable. Other: None. IMPRESSION: 1. No acute intracranial or calvarial findings. 2. Stable chronic left parietooccipital encephalomalacia, small vessel ischemic changes and ventriculomegaly. 3. No evidence of cervical spine fracture, traumatic subluxation or static signs of instability. Electronically Signed   By: Carey Bullocks M.D.   On: Feb 16, 2019 16:27   Ct Cervical Spine Wo Contrast  Result Date: 02-16-2019 CLINICAL DATA:  Fall with head injury.  No loss of consciousness. EXAM: CT HEAD WITHOUT CONTRAST CT CERVICAL SPINE WITHOUT CONTRAST TECHNIQUE: Multidetector CT imaging of the head and cervical spine was performed following the standard protocol without intravenous contrast. Multiplanar CT image reconstructions of the cervical spine were also generated. COMPARISON:  CT 12/27/2018 and 12/24/2018. Cervical spine CT 12/24/2018. FINDINGS: CT HEAD FINDINGS Brain: There is no evidence of acute intracranial hemorrhage, mass lesion, brain edema or extra-axial fluid collection. Extensive chronic encephalomalacia is again noted in the left parietooccipital lobe with associated ventriculomegaly and patchy periventricular white matter disease. No evidence of acute infarct or hydrocephalus. Vascular: Left carotid siphon aneurysm coil again noted. No hyperdense vessel. Skull: Negative for fracture or focal lesion. Sinuses/Orbits: The visualized paranasal sinuses, mastoid air cells and middle ears are clear. No orbital abnormalities are seen. Other: None. CT CERVICAL SPINE FINDINGS Alignment: Mild convex left scoliosis.  Normal sagittal alignment. 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 No evidence of acute fracture or traumatic subluxation. Soft tissues and spinal canal: No prevertebral fluid or swelling. No visible canal hematoma. Disc levels: Multilevel spondylosis again noted, greatest at C5-6 and  C6-7. No high-grade spinal stenosis identified. There are scattered facet degenerative changes which are worst on the left at C4-5. Upper chest: Unremarkable. Other: None. IMPRESSION: 1. No acute intracranial or calvarial findings. 2. Stable chronic left parietooccipital encephalomalacia, small vessel ischemic changes and ventriculomegaly. 3. No evidence of cervical spine fracture, traumatic subluxation or static signs of instability. Electronically Signed   By: Carey Bullocks M.D.   On: 16-Feb-2019 16:27   Dg Chest Port 1 View  Result Date: 01/29/2019 CLINICAL DATA:  High-grade fever. Worsening hypoxia. COVID-19 positive. EXAM: PORTABLE CHEST 1 VIEW COMPARISON:  01/24/2019 FINDINGS: Heart size is normal.  Aortic atherosclerosis. Worsening airspace disease is seen in both lower lungs. No evidence of pleural effusion. IMPRESSION: Worsening bilateral lower lobe airspace disease. Electronically Signed   By: Myles Rosenthal M.D.   On: 01/29/2019 07:31   Dg Chest Port 1 View  Result Date: 01/24/2019 CLINICAL DATA:  Fever. EXAM: PORTABLE CHEST 1 VIEW COMPARISON:  Radiographs of December 27, 2018. FINDINGS: The heart size and mediastinal contours are within normal limits. No pneumothorax or pleural effusion is noted. Stable bibasilar opacities are noted concerning for subsegmental atelectasis, scarring or possibly infiltrates. The visualized skeletal structures are unremarkable. IMPRESSION: Stable mild bibasilar opacities are noted concerning for subsegmental atelectasis, scarring or possibly infiltrates. Aortic Atherosclerosis (ICD10-I70.0). Electronically Signed   By: Lupita Raider, M.D.   On: 01/24/2019 17:35    Microbiology Recent Results (from the past 240 hour(s))  SARS Coronavirus 2 [facility] order, Performed in [facility] hospital lab)     Status: Abnormal   Collection Time: 01/24/19  9:46 AM  Result Value Ref Range Status   SARS Coronavirus 2 POSITIVE (A) NEGATIVE Final    Comment: RESULT CALLED TO,  READ BACK BY AND VERIFIED WITH: RN Shelia Media [id number] MLM (NOTE) If result is NEGATIVE SARS-CoV-2 target nucleic acids are NOT DETECTED. The  SARS-CoV-2 RNA is generally detectable in upper and lower  respiratory specimens during the acute phase of infection. The lowest  concentration of SARS-CoV-2 viral copies this assay can detect is 250  copies / mL. A negative result does not preclude SARS-CoV-2 infection  and should not be used as the sole basis for treatment or other  patient management decisions.  A negative result may occur with  improper specimen collection / handling, submission of specimen other  than nasopharyngeal swab, presence of viral mutation(s) within the  areas targeted by this assay, and inadequate number of viral copies  (<250 copies / mL). A negative result must be combined with clinical  observations, patient history, and epidemiological information. If result is POSITIVE SARS-CoV-2 target nucleic acids are DETECTED. The SARS- CoV-2 RNA is generally detectable in upper and lower  respiratory specimens during the acute phase of infection.  Positive  results are indicative of active infection with SARS-CoV-2.  Clinical  correlation with patient history and other diagnostic information is  necessary to determine patient infection status.  Positive results do  not rule out bacterial infection or co-infection with other viruses. If result is PRESUMPTIVE POSTIVE SARS-CoV-2 nucleic acids MAY BE PRESENT.   A presumptive positive result was obtained on the submitted specimen  and confirmed on repeat testing.  While 2019 novel coronavirus  (SARS-CoV-2) nucleic acids may be present in the submitted sample  additional confirmatory testing may be necessary for epidemiological  and / or clinical management purposes  to differentiate between  SARS-CoV-2 and other Sarbecovirus currently known to infect humans.  If clinically indicated additional testing with an alternate test   methodology [phone]) is advised . The SARS-CoV-2 RNA is generally  detectable in upper and lower respiratory specimens during the acute  phase of infection. The expected result is Negative. Fact Sheet for Patients:  BoilerBrush.com.cy Fact Sheet for Healthcare Providers: https://pope.com/ This test is not yet approved or cleared by the [place] FDA and has been authorized for detection and/or diagnosis of SARS-CoV-2 by FDA under an Emergency Use Authorization (EUA).  This EUA will remain in effect (meaning this test can be used) for the duration of the COVID-19 declaration under Section 564(b)(1) of the Act, 21 U.S.C. section 360bbb-3(b)(1), unless the authorization is terminated or revoked sooner. Performed at [facility], [address], [place], [place] [address]   Blood Culture (routine x 2)     Status: None   Collection Time: 01/24/19  3:19 PM  Result Value Ref Range Status   Specimen Description BLOOD SITE NOT SPECIFIED  Final   Special Requests   Final    BOTTLES DRAWN AEROBIC ONLY Blood Culture adequate volume   Culture   Final    NO GROWTH 5 DAYS Performed at [facility], [address] [place], [place] [address]    Report Status 01/29/2019 FINAL  Final  Blood Culture (routine x 2)     Status: None   Collection Time: 01/24/19  3:27 PM  Result Value Ref Range Status   Specimen Description BLOOD WRIST RIGHT  Final   Special Requests   Final    BOTTLES DRAWN AEROBIC AND ANAEROBIC Blood Culture adequate volume   Culture   Final    NO GROWTH 5 DAYS Performed at [facility], [address] [place], [place] [address]    Report Status 01/29/2019 FINAL  Final  MRSA PCR Screening     Status: None   Collection Time:  01/29/19  8:11 AM  Result Value Ref Range Status   MRSA by PCR NEGATIVE NEGATIVE Final    Comment:        The GeneXpert MRSA Assay (FDA approved for NASAL specimens only), is one  component of a comprehensive MRSA colonization surveillance program. It is not intended to diagnose MRSA infection nor to guide or monitor treatment for MRSA infections. Performed at [facility], [address], [place], [place] [address]   Culture, blood (routine x 2)     Status: None (Preliminary result)   Collection Time: 01/29/19 12:30 PM  Result Value Ref Range Status   Specimen Description BLOOD RIGHT ANTECUBITAL  Final   Special Requests   Final    BOTTLES DRAWN AEROBIC ONLY Blood Culture adequate volume   Culture   Final    NO GROWTH 4 DAYS Performed at [facility], [address], [place], [place] [address]    Report Status PENDING  Incomplete  Culture, blood (routine x 2)     Status: None (Preliminary result)   Collection Time: 01/29/19 12:30 PM  Result Value Ref Range Status   Specimen Description BLOOD LEFT ANTECUBITAL  Final   Special Requests   Final    BOTTLES DRAWN AEROBIC ONLY Blood Culture adequate volume   Culture   Final    NO GROWTH 4 DAYS Performed at [facility], [address], [place], [place] [address]    Report Status PENDING  Incomplete    Lab Basic Metabolic Panel: Recent Labs  Lab 01/27/19 0433 01/28/19 0438 01/29/19 0520 01/30/19 0645  NA 139 142 145 144  K 3.5 3.8 3.5 3.8  CL 100 100 102 102  CO2 28 31 35* 30  GLUCOSE 103* 93 106* 75  BUN 18 23 12 18   CREATININE 1.91* 1.29* 1.01* 1.11*  CALCIUM 8.1* 8.2* 8.2* 7.8*   Liver Function Tests: Recent Labs  Lab 01/29/19 0520 01/30/19 0645  AST 273* 302*  ALT 107* 145*  ALKPHOS 61 95  BILITOT 0.6 0.7  PROT 5.8* 6.3*  ALBUMIN 2.5* 2.8*   No results for input(s): LIPASE, AMYLASE in the last 168 hours. Recent Labs  Lab 01/26/19 1205 01/29/19 0520  AMMONIA 53* 44*   CBC: Recent Labs  Lab 01/27/19 0433 01/28/19 0438 01/30/19 0645  WBC 4.0 5.4 6.8  NEUTROABS  --   --  5.5  HGB 12.8 12.2 11.4*  HCT 41.0 39.6 37.0  MCV 92.1 92.1 95.6  PLT 125* 162 233    Sepsis Labs: Recent Labs  Lab 01/27/19 0433 01/28/19 0438 01/29/19 1230 01/30/19 0645  PROCALCITON  --   --  0.30  --   WBC 4.0 5.4  --  6.8    Procedures/Operations  None   Tyrone Nine, MD 02/09/2019, 11:30 AM

## 2019-02-12 NOTE — Progress Notes (Signed)
Patient expired @1006 , pronounced by Abby RN and Verner Mould. Patient husband was at the bedside. Called the medical examiner Talmadge Chad) @1054 . No need for the body to be examined. Spoke to physician @1056  to notify him of patient expiring. Physician came to floor and signed Certificate of Death. Called [facility] at [phone] and spoke to [employer] Ref #[id number]. Patient not a candidate. Cleaned patients body up and placed in the body bag. Patients bodies taken down to the morgue.

## 2019-02-12 NOTE — Progress Notes (Signed)
Wasted reminder of morphine which was 25cc into the stericycle with [place] RN.

## 2019-02-12 NOTE — Progress Notes (Signed)
Spoke to the doctor and patients husband, we all agreed to remove the patients oxygen and just try to keep her comfortable. Patient has labored breathing and is moaning. Gave 1 mg bolus at 0755 and increased the rate to 5 mg . Patient continued to moan gave another 1 mg bolus at 0837. Patient resting comfortably husband sitting at bedside.

## 2019-02-12 DEATH — deceased

## 2019-03-02 ENCOUNTER — Ambulatory Visit: Payer: Medicare HMO | Admitting: Neurology

## 2019-03-04 ENCOUNTER — Ambulatory Visit: Payer: Medicare HMO | Admitting: Neurology

## 2019-03-10 ENCOUNTER — Other Ambulatory Visit: Payer: Self-pay | Admitting: Medical

## 2019-03-17 ENCOUNTER — Ambulatory Visit: Payer: Medicare HMO | Admitting: Neurology
